# Patient Record
Sex: Male | Born: 1935 | Race: White | Hispanic: No | Marital: Married | Smoking: Never smoker
Health system: Southern US, Community
[De-identification: ages and names within clinical notes are randomized; demographics above are authoritative.]

## PROBLEM LIST (undated history)

## (undated) DIAGNOSIS — R739 Hyperglycemia, unspecified: Secondary | ICD-10-CM

## (undated) DIAGNOSIS — E119 Type 2 diabetes mellitus without complications: Secondary | ICD-10-CM

## (undated) DIAGNOSIS — F0390 Unspecified dementia without behavioral disturbance: Secondary | ICD-10-CM

## (undated) DIAGNOSIS — E079 Disorder of thyroid, unspecified: Secondary | ICD-10-CM

## (undated) DIAGNOSIS — I639 Cerebral infarction, unspecified: Secondary | ICD-10-CM

## (undated) DIAGNOSIS — I10 Essential (primary) hypertension: Secondary | ICD-10-CM

## (undated) HISTORY — PX: EYE SURGERY: SHX253

---

## 2011-01-01 ENCOUNTER — Ambulatory Visit: Payer: Self-pay | Admitting: Ophthalmology

## 2011-01-28 ENCOUNTER — Ambulatory Visit: Payer: Self-pay | Admitting: Ophthalmology

## 2014-03-16 ENCOUNTER — Ambulatory Visit: Payer: Self-pay | Admitting: Unknown Physician Specialty

## 2016-01-17 DIAGNOSIS — I350 Nonrheumatic aortic (valve) stenosis: Secondary | ICD-10-CM | POA: Insufficient documentation

## 2016-09-20 ENCOUNTER — Encounter: Payer: Self-pay | Admitting: Gynecology

## 2016-09-20 ENCOUNTER — Ambulatory Visit
Admission: EM | Admit: 2016-09-20 | Discharge: 2016-09-20 | Disposition: A | Discharge status: Home / Self Care | Payer: Medicare Other | Attending: Family Medicine | Admitting: Family Medicine

## 2016-09-20 DIAGNOSIS — H9221 Otorrhagia, right ear: Secondary | ICD-10-CM

## 2016-09-20 HISTORY — DX: Type 2 diabetes mellitus without complications: E11.9

## 2016-09-20 HISTORY — DX: Essential (primary) hypertension: I10

## 2016-09-20 HISTORY — DX: Hyperglycemia, unspecified: R73.9

## 2016-09-20 HISTORY — DX: Disorder of thyroid, unspecified: E07.9

## 2016-09-20 NOTE — ED Triage Notes (Signed)
Per patient lesion remove from right ear.per orders patient had biopsy done on his right ear x 4 days ago and was told not to take off bandage until seventh day. Per patient notice blood from bandage.

## 2016-09-20 NOTE — ED Provider Notes (Signed)
[facility]    CSN: [id number] Arrival date & time: 09/20/16  1427     History   Chief Complaint Chief Complaint  Patient presents with  . Ear Problem    HPI Jerry Barrett is a 83 y.o. male.   Patient is a 57-year-old white male who had a biopsy of the right ear. I've still concerned about some type of CA on the right ear lobe. According to his wife he's had bleeding since he had a biopsy done on Wednesday. They're ptotic take the dressing off on Monday the still bleeding and oozing from the site and this morning was even worse. He states she's not sleeping on the right side and avoiding. Pressure on it still bleeds and still losing blood. He does have bilateral hearing aids.      Past Medical History:  Diagnosis Date  . Diabetes mellitus without complication (HCC)   . Hyperglycemia   . Hypertension   . Thyroid disease     There are no active problems to display for this patient.   Past Surgical History:  Procedure Laterality Date  . EYE SURGERY         Home Medications    Prior to Admission medications   Medication Sig Start Date End Date Taking? Authorizing Provider  aspirin EC 81 MG tablet Take 81 mg by mouth daily.   Yes Historical Provider, MD  levothyroxine (SYNTHROID, LEVOTHROID) 50 MCG tablet Take 50 mcg by mouth daily before breakfast.   Yes Historical Provider, MD  lisinopril (PRINIVIL,ZESTRIL) 2.5 MG tablet Take 2.5 mg by mouth daily.   Yes Historical Provider, MD  lovastatin (MEVACOR) 40 MG tablet Take 80 mg by mouth at bedtime.   Yes Historical Provider, MD  metFORMIN (GLUCOPHAGE) 850 MG tablet Take 850 mg by mouth 3 (three) times daily.   Yes Historical Provider, MD  sildenafil (VIAGRA) 100 MG tablet Take 100 mg by mouth daily as needed for erectile dysfunction.   Yes Historical Provider, MD    Family History No family history on file.  Social History Social History  Substance Use Topics  . Smoking status: Never Smoker  .  Smokeless tobacco: Never Used  . Alcohol use No     Allergies   Patient has no known allergies.   Review of Systems Review of Systems  All other systems reviewed and are negative.    Physical Exam Triage Vital Signs ED Triage Vitals  Enc Vitals Group     BP 09/20/16 1500 (!) 158/60     Pulse Rate 09/20/16 1500 79     Resp 09/20/16 1500 16     Temp 09/20/16 1500 98.1 F (36.7 C)     Temp Source 09/20/16 1500 Oral     SpO2 09/20/16 1500 100 %     Weight 09/20/16 1503 180 lb (81.6 kg)     Height 09/20/16 1503 6' (1.829 m)     Head Circumference --      Peak Flow --      Pain Score --      Pain Loc --      Pain Edu? --      Excl. in GC? --    No data found.   Updated Vital Signs BP (!) 158/60 (BP Location: Left Arm)   Pulse 79   Temp 98.1 F (36.7 C) (Oral)   Resp 16   Ht 6' (1.829 m)   Wt 180 lb (81.6 kg)   SpO2 100%  BMI 24.41 kg/m   Visual Acuity Right Eye Distance:   Left Eye Distance:   Bilateral Distance:    Right Eye Near:   Left Eye Near:    Bilateral Near:     Physical Exam  Constitutional: He appears well-developed and well-nourished.  HENT:  Head: Normocephalic.  Right Ear: Hearing, tympanic membrane and ear canal normal.  Left Ear: Hearing, tympanic membrane, external ear and ear canal normal.  Ears:  Nose: Nose normal.  Mouth/Throat: Uvula is midline.  Patient has a biopsy site over the right external ear. Should be noted that with pressure dressings at near the opening of the ear is not adherent fact you see with a blood has used out from that area  Eyes: Pupils are equal, round, and reactive to light.  Neck: Normal range of motion. Neck supple.  Pulmonary/Chest: Effort normal.  Musculoskeletal: Normal range of motion.  Neurological: He is alert.  Skin: Skin is warm.  Psychiatric: He has a normal mood and affect.     UC Treatments / Results  Labs (all labs ordered are listed, but only abnormal results are displayed) Labs  Reviewed - No data to display  EKG  EKG Interpretation None       Radiology No results found.  Procedures Wound repair Date/Time: 09/20/2016 3:27 PM Performed by: Hassan Rowan Authorized by: Hassan Rowan  Consent: Verbal consent obtained. Local anesthesia used: no  Anesthesia: Local anesthesia used: no  Sedation: Patient sedated: no Patient tolerance: Patient tolerated the procedure well with no immediate complications Comments: Since the packing was no longer adherent to the ear by the ear canal bowel packing was removed patient tolerated that quite well OpSite was placed to meet adherence of OpSite to the bleeding site which was close to the opening of the ear canal. Then a pressure dressing was applied as well.    (including critical care time)  Medications Ordered in UC Medications - No data to display   Initial Impression / Assessment and Plan / UC Course  I have reviewed the triage vital signs and the nursing notes.  Pertinent labs & imaging results that were available during my care of the patient were reviewed by me and considered in my medical decision making (see chart for details).    OpSite was followed with a pressure dressing to keep the dressing in place recommend following up with his dermatologist on Monday given the mother called may want to have OpSite remain total falls off for take a look at it.  Final Clinical Impressions(s) / UC Diagnoses   Final diagnoses:  Bleeding from ear, right     New Prescriptions New Prescriptions   No medications on file    Note: This dictation was prepared with Dragon dictation along with smaller phrase technology. Any transcriptional errors that result from this process are unintentional.   Hassan Rowan, MD 09/20/16 1530

## 2016-09-20 NOTE — Discharge Instructions (Signed)
Leave outside on the right ear ideally and 2 Wednesday but follow-up with dermatologist if bleeding continues after that

## 2017-07-19 ENCOUNTER — Emergency Department: Payer: Medicare Other

## 2017-07-19 ENCOUNTER — Encounter: Payer: Self-pay | Admitting: Emergency Medicine

## 2017-07-19 ENCOUNTER — Other Ambulatory Visit: Payer: Self-pay

## 2017-07-19 ENCOUNTER — Observation Stay
Admission: EM | Admit: 2017-07-19 | Discharge: 2017-07-21 | Disposition: A | Discharge status: Home / Self Care | Payer: Medicare Other | Attending: Internal Medicine | Admitting: Internal Medicine

## 2017-07-19 DIAGNOSIS — E119 Type 2 diabetes mellitus without complications: Secondary | ICD-10-CM

## 2017-07-19 DIAGNOSIS — I10 Essential (primary) hypertension: Secondary | ICD-10-CM | POA: Diagnosis present

## 2017-07-19 DIAGNOSIS — Z79899 Other long term (current) drug therapy: Secondary | ICD-10-CM | POA: Insufficient documentation

## 2017-07-19 DIAGNOSIS — R6510 Systemic inflammatory response syndrome (SIRS) of non-infectious origin without acute organ dysfunction: Principal | ICD-10-CM | POA: Diagnosis present

## 2017-07-19 DIAGNOSIS — R4182 Altered mental status, unspecified: Secondary | ICD-10-CM

## 2017-07-19 DIAGNOSIS — Z7982 Long term (current) use of aspirin: Secondary | ICD-10-CM | POA: Diagnosis not present

## 2017-07-19 DIAGNOSIS — E785 Hyperlipidemia, unspecified: Secondary | ICD-10-CM | POA: Diagnosis not present

## 2017-07-19 DIAGNOSIS — R5383 Other fatigue: Secondary | ICD-10-CM | POA: Insufficient documentation

## 2017-07-19 DIAGNOSIS — E039 Hypothyroidism, unspecified: Secondary | ICD-10-CM | POA: Diagnosis not present

## 2017-07-19 DIAGNOSIS — Z79890 Hormone replacement therapy (postmenopausal): Secondary | ICD-10-CM | POA: Diagnosis not present

## 2017-07-19 DIAGNOSIS — J101 Influenza due to other identified influenza virus with other respiratory manifestations: Secondary | ICD-10-CM | POA: Diagnosis present

## 2017-07-19 DIAGNOSIS — Z7984 Long term (current) use of oral hypoglycemic drugs: Secondary | ICD-10-CM | POA: Diagnosis not present

## 2017-07-19 LAB — INFLUENZA PANEL BY PCR (TYPE A & B)
INFLAPCR: POSITIVE — AB
Influenza B By PCR: NEGATIVE

## 2017-07-19 LAB — COMPREHENSIVE METABOLIC PANEL
ALBUMIN: 4 g/dL (ref 3.5–5.0)
ALK PHOS: 48 U/L (ref 38–126)
ALT: 42 U/L (ref 17–63)
AST: 58 U/L — ABNORMAL HIGH (ref 15–41)
Anion gap: 9 (ref 5–15)
BILIRUBIN TOTAL: 0.7 mg/dL (ref 0.3–1.2)
BUN: 15 mg/dL (ref 6–20)
CALCIUM: 8.7 mg/dL — AB (ref 8.9–10.3)
CO2: 22 mmol/L (ref 22–32)
CREATININE: 1.05 mg/dL (ref 0.61–1.24)
Chloride: 100 mmol/L — ABNORMAL LOW (ref 101–111)
GFR calc Af Amer: >60 mL/min (ref >60)
GFR calc non Af Amer: >60 mL/min (ref >60)
GLUCOSE: 142 mg/dL — AB (ref 65–99)
Potassium: 3.9 mmol/L (ref 3.5–5.1)
SODIUM: 131 mmol/L — AB (ref 135–145)
Total Protein: 7.3 g/dL (ref 6.5–8.1)

## 2017-07-19 LAB — CBC WITH DIFFERENTIAL/PLATELET
BASOS PCT: 1 %
Basophils Absolute: 0 10*3/uL (ref 0–0.1)
EOS ABS: 0.1 10*3/uL (ref 0–0.7)
Eosinophils Relative: 1 %
HEMATOCRIT: 40.6 % (ref 40.0–52.0)
Hemoglobin: 14.3 g/dL (ref 13.0–18.0)
Lymphocytes Relative: 10 %
Lymphs Abs: 0.9 10*3/uL — ABNORMAL LOW (ref 1.0–3.6)
MCH: 34 pg (ref 26.0–34.0)
MCHC: 35.3 g/dL (ref 32.0–36.0)
MCV: 96.4 fL (ref 80.0–100.0)
MONO ABS: 0.6 10*3/uL (ref 0.2–1.0)
MONOS PCT: 7 %
Neutro Abs: 6.8 10*3/uL — ABNORMAL HIGH (ref 1.4–6.5)
Neutrophils Relative %: 81 %
PLATELETS: 156 10*3/uL (ref 150–440)
RBC: 4.22 MIL/uL — ABNORMAL LOW (ref 4.40–5.90)
RDW: 13.8 % (ref 11.5–14.5)
WBC: 8.3 10*3/uL (ref 3.8–10.6)

## 2017-07-19 LAB — URINALYSIS, ROUTINE W REFLEX MICROSCOPIC
BACTERIA UA: NONE SEEN
BILIRUBIN URINE: NEGATIVE
GLUCOSE, UA: NEGATIVE mg/dL
KETONES UR: NEGATIVE mg/dL
Leukocytes, UA: NEGATIVE
NITRITE: NEGATIVE
PH: 6 (ref 5.0–8.0)
PROTEIN: NEGATIVE mg/dL
Specific Gravity, Urine: 1.015 (ref 1.005–1.030)
Squamous Epithelial / LPF: NONE SEEN
WBC, UA: NONE SEEN WBC/hpf (ref 0–5)

## 2017-07-19 LAB — LACTIC ACID, PLASMA
LACTIC ACID, VENOUS: 1.6 mmol/L (ref 0.5–1.9)
Lactic Acid, Venous: 2.1 mmol/L (ref 0.5–1.9)

## 2017-07-19 LAB — GLUCOSE, CAPILLARY: GLUCOSE-CAPILLARY: 139 mg/dL — AB (ref 65–99)

## 2017-07-19 MED ORDER — LEVOTHYROXINE SODIUM 50 MCG PO TABS
50.0000 ug | ORAL_TABLET | Freq: Every day | ORAL | Status: DC
  Administered 2017-07-20 – 2017-07-21 (×2): 50 ug via ORAL
  Filled 2017-07-19 (×2): qty 1

## 2017-07-19 MED ORDER — ENOXAPARIN SODIUM 40 MG/0.4ML ~~LOC~~ SOLN
40.0000 mg | SUBCUTANEOUS | Status: DC
  Administered 2017-07-20: 40 mg via SUBCUTANEOUS
  Filled 2017-07-19: qty 0.4

## 2017-07-19 MED ORDER — SODIUM CHLORIDE 0.9 % IV BOLUS (SEPSIS)
1000.0000 mL | Freq: Once | INTRAVENOUS | Status: AC
  Administered 2017-07-19: 1000 mL via INTRAVENOUS

## 2017-07-19 MED ORDER — ACETAMINOPHEN 325 MG PO TABS: 650.0000 mg | ORAL_TABLET | Freq: Four times a day (QID) | ORAL | Status: DC | PRN

## 2017-07-19 MED ORDER — VANCOMYCIN HCL 10 G IV SOLR
1250.0000 mg | INTRAVENOUS | Status: DC
  Administered 2017-07-20: 1250 mg via INTRAVENOUS
  Filled 2017-07-19 (×2): qty 1250

## 2017-07-19 MED ORDER — SODIUM CHLORIDE 0.9 % IV BOLUS (SEPSIS)
500.0000 mL | Freq: Once | INTRAVENOUS | Status: AC
  Administered 2017-07-19: 500 mL via INTRAVENOUS

## 2017-07-19 MED ORDER — PIPERACILLIN-TAZOBACTAM 3.375 G IVPB 30 MIN
3.3750 g | Freq: Once | INTRAVENOUS | Status: AC
  Administered 2017-07-19: 3.375 g via INTRAVENOUS
  Filled 2017-07-19: qty 50

## 2017-07-19 MED ORDER — ONDANSETRON HCL 4 MG/2ML IJ SOLN: 4.0000 mg | Freq: Four times a day (QID) | INTRAMUSCULAR | Status: DC | PRN

## 2017-07-19 MED ORDER — VANCOMYCIN HCL IN DEXTROSE 1-5 GM/200ML-% IV SOLN
1000.0000 mg | Freq: Once | INTRAVENOUS | Status: AC
  Administered 2017-07-19: 1000 mg via INTRAVENOUS
  Filled 2017-07-19: qty 200

## 2017-07-19 MED ORDER — ASPIRIN EC 81 MG PO TBEC
81.0000 mg | DELAYED_RELEASE_TABLET | Freq: Every day | ORAL | Status: DC
  Administered 2017-07-20 – 2017-07-21 (×2): 81 mg via ORAL
  Filled 2017-07-19 (×2): qty 1

## 2017-07-19 MED ORDER — SODIUM CHLORIDE 0.9 % IV SOLN
INTRAVENOUS | Status: AC
  Administered 2017-07-19: 23:00:00 via INTRAVENOUS

## 2017-07-19 MED ORDER — INSULIN ASPART 100 UNIT/ML ~~LOC~~ SOLN: 0.0000 [IU] | Freq: Every day | SUBCUTANEOUS | Status: DC

## 2017-07-19 MED ORDER — PIPERACILLIN-TAZOBACTAM 3.375 G IVPB
3.3750 g | Freq: Three times a day (TID) | INTRAVENOUS | Status: DC
  Administered 2017-07-19 – 2017-07-20 (×2): 3.375 g via INTRAVENOUS
  Filled 2017-07-19 (×2): qty 50

## 2017-07-19 MED ORDER — IPRATROPIUM-ALBUTEROL 0.5-2.5 (3) MG/3ML IN SOLN: 3.0000 mL | RESPIRATORY_TRACT | Status: DC | PRN

## 2017-07-19 MED ORDER — PRAVASTATIN SODIUM 40 MG PO TABS
80.0000 mg | ORAL_TABLET | Freq: Every day | ORAL | Status: DC
  Administered 2017-07-20: 80 mg via ORAL
  Filled 2017-07-19: qty 2

## 2017-07-19 MED ORDER — ONDANSETRON HCL 4 MG PO TABS: 4.0000 mg | ORAL_TABLET | Freq: Four times a day (QID) | ORAL | Status: DC | PRN

## 2017-07-19 MED ORDER — INSULIN ASPART 100 UNIT/ML ~~LOC~~ SOLN
0.0000 [IU] | Freq: Three times a day (TID) | SUBCUTANEOUS | Status: DC
  Administered 2017-07-20: 1 [IU] via SUBCUTANEOUS
  Administered 2017-07-20: 2 [IU] via SUBCUTANEOUS
  Administered 2017-07-20 – 2017-07-21 (×3): 1 [IU] via SUBCUTANEOUS
  Filled 2017-07-19 (×5): qty 1

## 2017-07-19 MED ORDER — OSELTAMIVIR PHOSPHATE 75 MG PO CAPS: 75.0000 mg | ORAL_CAPSULE | Freq: Once | ORAL | Status: DC

## 2017-07-19 MED ORDER — ACETAMINOPHEN 650 MG RE SUPP: 650.0000 mg | Freq: Four times a day (QID) | RECTAL | Status: DC | PRN

## 2017-07-19 MED ORDER — GUAIFENESIN-DM 100-10 MG/5ML PO SYRP: 5.0000 mL | ORAL_SOLUTION | ORAL | Status: DC | PRN

## 2017-07-19 MED ORDER — LORAZEPAM 2 MG/ML IJ SOLN
INTRAMUSCULAR | Status: AC
  Administered 2017-07-19: 1 mg
  Filled 2017-07-19: qty 1

## 2017-07-19 NOTE — ED Notes (Signed)
Pt belongings at the time of admission include: 1 yellow wedding band/ 1 pair of hearing aids *R&L*/  Glasses/ 1 blue shirt/ 1 plaid shirt/ 1 pair of black shoes. Wife Luiz Ochoa( Freedie) has patients pants and underwear (items were soiled)  Wife took home pts watch, car keys and wallet.

## 2017-07-19 NOTE — ED Triage Notes (Signed)
Pt has cough x 5 days with fever. Worse today with altered mental

## 2017-07-19 NOTE — Consult Note (Signed)
Pharmacy Antibiotic Note  Jerry CrapeDonald W Cashwell is a 84 y.o. male admitted on 07/19/2017 with Sepsis. Influenza A PCR: Positive.   Pharmacy has been consulted for Zosyn and Vancomycin dosing. Patient received vancomycin 1g IV and Zosyn 3.375 IV x 1 dose in ED.   Plan: Ke: 0.053  Vd: 57.1   T1/2: 13.08  Start vancomycin 1250mg  IV every 18 hours with 6 hour stack dosing. Calculated trough at Css 16.5. Trough level prior to 4th dose. Will monitor renal function and adjust dose as needed.   Start Zosyn 3.375 IV EI every 8 hours.   Height: 6' (182.9 cm) Weight: 180 lb (81.6 kg) IBW/kg (Calculated) : 77.6  No data recorded.  Recent Labs  Lab 07/19/17 1910  WBC 8.3  CREATININE 1.05  LATICACIDVEN 2.1*    Estimated Creatinine Clearance: 58.5 mL/min (by C-G formula based on SCr of 1.05 mg/dL).    No Known Allergies  Antimicrobials this admission: 2/10 Zosyn >> 2/10 Vancomycin >>   Dose adjustments this admission:  Microbiology results: 2/10 BCx: pending 2/10 Influenza A PCR: Positive   Thank you for allowing pharmacy to be a part of this patient's care.  Gardner CandleSheema M Amarion Portell, PharmD, BCPS Clinical Pharmacist 07/19/2017 10:05 PM

## 2017-07-19 NOTE — Progress Notes (Signed)
CODE SEPSIS - PHARMACY COMMUNICATION  **Broad Spectrum Antibiotics should be administered within 1 hour of Sepsis diagnosis**  Time Code Sepsis Called/Page Received: 2/10 @ 19:12  Antibiotics Ordered: Zosyn 3.375                                      Vancomycin 1g IV   Time of 1st antibiotic administration: 2/10 @ 19:19  Additional action taken by pharmacy: N/A  If necessary, Name of Provider/Nurse Contacted: N/A   Gardner CandleSheema M Jannessa Ogden, PharmD, BCPS Clinical Pharmacist 07/19/2017 7:37 PM

## 2017-07-19 NOTE — ED Notes (Signed)
Sitter with pt - pt has been attempting to get out of bed, pulling at iv's and monitor.

## 2017-07-19 NOTE — H&P (Signed)
[facility] -  at [facility]   PATIENT NAME: Jerry HummerDonald Barrett    MR#:  [id number]  DATE OF BIRTH:  01/02/1933  DATE OF ADMISSION:  07/19/2017  PRIMARY CARE PHYSICIAN: [place], [facility]   REQUESTING/REFERRING PHYSICIAN: Derrill KayGoodman, MD  CHIEF COMPLAINT:   Chief Complaint  Patient presents with  . Altered Mental Status  . Fever    HISTORY OF PRESENT ILLNESS:  Jerry Barrett  is a 84 y.o. male who presents with several days of progressive symptoms including fever, cough, myalgias.  Today he became more confused.  He is unable to contribute to his history and it is provided by his wife was at bedside.  She states that she had been treating his fever with Tylenol and his other symptoms with over-the-counter medications.  However, today when he became significantly more confused she brought him to the ED for evaluation.  Here he is found to be influenza A positive.  Initial workup does not strongly suggest bacterial infection, however he did meet Sirs criteria and so antibiotics were started initially empirically.  Hospitalist were called for admission.  PAST MEDICAL HISTORY:   Past Medical History:  Diagnosis Date  . Diabetes mellitus without complication (HCC)   . Hyperglycemia   . Hypertension   . Thyroid disease     PAST SURGICAL HISTORY:   Past Surgical History:  Procedure Laterality Date  . EYE SURGERY      SOCIAL HISTORY:   Social History   Tobacco Use  . Smoking status: Never Smoker  . Smokeless tobacco: Never Used  Substance Use Topics  . Alcohol use: No    FAMILY HISTORY:  History reviewed. No pertinent family history.  DRUG ALLERGIES:  No Known Allergies  MEDICATIONS AT HOME:   Prior to Admission medications   Medication Sig Start Date End Date Taking? Authorizing Provider  aspirin EC 81 MG tablet Take 81 mg by mouth daily.   Yes [provider]  levothyroxine (SYNTHROID, LEVOTHROID) 50 MCG tablet Take 50 mcg by  mouth daily before breakfast.   Yes [provider]  lisinopril (PRINIVIL,ZESTRIL) 2.5 MG tablet Take 2.5 mg by mouth daily.   Yes [provider]  lovastatin (MEVACOR) 40 MG tablet Take 80 mg by mouth at bedtime.   Yes [provider]  metFORMIN (GLUCOPHAGE) 850 MG tablet Take 850 mg by mouth 3 (three) times daily.   Yes [provider]  sildenafil (VIAGRA) 100 MG tablet Take 100 mg by mouth daily as needed for erectile dysfunction.   Yes [provider]    REVIEW OF SYSTEMS:  Review of Systems  Unable to perform ROS: Acuity of condition     VITAL SIGNS:   Vitals:   07/19/17 1906 07/19/17 1930 07/19/17 2000 07/19/17 2100  BP:  122/85 126/68   Pulse:  98 96 (!) 106  Resp:  (!) 26 (!) 23   SpO2:  96% 95% (!) 86%  Weight: 81.6 kg (180 lb)     Height: 6' (1.829 m)      Wt Readings from Last 3 Encounters:  07/19/17 81.6 kg (180 lb)  09/20/16 81.6 kg (180 lb)    PHYSICAL EXAMINATION:  Physical Exam  Vitals reviewed. Constitutional: He appears well-developed and well-nourished.  HENT:  Head: Normocephalic and atraumatic.  Mouth/Throat: Oropharynx is clear and moist.  Eyes: Conjunctivae and EOM are normal. Pupils are equal, round, and reactive to light. No scleral icterus.  Neck: Normal range of motion. Neck  supple. No JVD present. No thyromegaly present.  Cardiovascular: Normal rate, regular rhythm and intact distal pulses. Exam reveals no gallop and no friction rub.  No murmur heard. Respiratory: Effort normal and breath sounds normal. No respiratory distress. He has no wheezes. He has no rales.  GI: Soft. Bowel sounds are normal. He exhibits no distension. There is no tenderness.  Musculoskeletal: Normal range of motion. He exhibits no edema.  No arthritis, no gout  Lymphadenopathy:    He has no cervical adenopathy.  Neurological:  Unable to fully assess due to patient condition  Skin: Skin is warm and dry. No rash noted. No  erythema.  Psychiatric:  Unable to assess due to patient condition    LABORATORY PANEL:   CBC Recent Labs  Lab 07/19/17 1910  WBC 8.3  HGB 14.3  HCT 40.6  PLT 156   ------------------------------------------------------------------------------------------------------------------  Chemistries  Recent Labs  Lab 07/19/17 1910  NA 131*  K 3.9  CL 100*  CO2 22  GLUCOSE 142*  BUN 15  CREATININE 1.05  CALCIUM 8.7*  AST 58*  ALT 42  ALKPHOS 48  BILITOT 0.7   ------------------------------------------------------------------------------------------------------------------  Cardiac Enzymes No results for input(s): TROPONINI in the last 168 hours. ------------------------------------------------------------------------------------------------------------------  RADIOLOGY:  Dg Chest Port 1 View  Result Date: 07/19/2017 CLINICAL DATA:  Cough for 5 days with fever. EXAM: PORTABLE CHEST 1 VIEW COMPARISON:  None. FINDINGS: Cardiac silhouette is normal in size. No mediastinal or hilar masses. No convincing adenopathy. There are irregularly thickened bronchovascular markings in the medial lung bases, which is likely due to chronic bronchitic change. No convincing pneumonia. No pulmonary edema. No pleural effusion or pneumothorax. Skeletal structures are demineralized but grossly intact. IMPRESSION: No acute cardiopulmonary disease. Electronically Signed   By: Amie Portland M.D.   On: 07/19/2017 20:54    EKG:   Orders placed or performed during the hospital encounter of 07/19/17  . ED EKG 12-Lead  . ED EKG 12-Lead    IMPRESSION AND PLAN:  Principal Problem:   SIRS (systemic inflammatory response syndrome) (HCC) -IV antibiotics started, lactic acid was barely elevated at 2.1, we will use IV fluids and recheck until within normal limits, blood pressure stable, cultures sent, treatment for flu as below Active Problems:   Influenza A -patient is outside the window where Tamiflu  would be effective, supportive treatment will be utilized at this time IV fluids, PRN antitussive, PRN duo nebs if necessary   Diabetes (HCC) -sliding scale insulin with corresponding glucose checks   HTN (hypertension) -patient is normotensive at this time, hold home antihypertensives for now, can be restarted as his blood pressure rises   Hypothyroidism -home dose thyroid replacement   HLD (hyperlipidemia) -home dose statin  All the records are reviewed and case discussed with ED provider. Management plans discussed with the patient and/or family.  DVT PROPHYLAXIS: SubQ lovenox  GI PROPHYLAXIS: None  ADMISSION STATUS: Observation  CODE STATUS: Full Code Status History    This patient does not have a recorded code status. Please follow your organizational policy for patients in this situation.      TOTAL TIME TAKING CARE OF THIS PATIENT: 40 minutes.   Kaamil Morefield FIELDING 07/19/2017, 9:26 PM  [employer]  [phone]  CC: Primary care physician; [facility]  Note:  This document was prepared using Dragon voice recognition software and may include unintentional dictation errors.

## 2017-07-19 NOTE — ED Notes (Signed)
Critical lactic acid of 2.1 called from lab. Dr. Derrill KayGoodman notified, no new orders received.

## 2017-07-19 NOTE — ED Provider Notes (Signed)
[facility] Emergency Department Provider Note  ____________________________________________   I have reviewed the triage vital signs and the nursing notes.   HISTORY  Chief Complaint Altered Mental Status and Fever   History limited by and level 5 caveat due to: Altered Mental Status   HPI Jerry Barrett is a 84 y.o. male who presents to the emergency department today brought in by EMS because of concerns for altered mental status and fever.  EMS states that the patient has had a cough for roughly 1 week.  Apparently he is also had a fever for roughly 5 days.  Was seen by PCP 3 days ago.  Apparently wife stated that today he became more confused.  It started around noon.  Patient himself can tell me his name but does not know where he is why he is where he is or what year it is.   Per medical record review patient has a history of DM, HTN, was seen 3 days ago by PCP.  Past Medical History:  Diagnosis Date  . Diabetes mellitus without complication (HCC)   . Hyperglycemia   . Hypertension   . Thyroid disease     There are no active problems to display for this patient.   Past Surgical History:  Procedure Laterality Date  . EYE SURGERY      Prior to Admission medications   Medication Sig Start Date End Date Taking? Authorizing Provider  aspirin EC 81 MG tablet Take 81 mg by mouth daily.    [provider]  levothyroxine (SYNTHROID, LEVOTHROID) 50 MCG tablet Take 50 mcg by mouth daily before breakfast.    [provider]  lisinopril (PRINIVIL,ZESTRIL) 2.5 MG tablet Take 2.5 mg by mouth daily.    [provider]  lovastatin (MEVACOR) 40 MG tablet Take 80 mg by mouth at bedtime.    [provider]  metFORMIN (GLUCOPHAGE) 850 MG tablet Take 850 mg by mouth 3 (three) times daily.    [provider]  sildenafil (VIAGRA) 100 MG tablet Take 100 mg by mouth daily as needed for erectile dysfunction.    [provider]    Allergies Patient has no known allergies.  History reviewed. No pertinent family history.  Social History Social History   Tobacco Use  . Smoking status: Never Smoker  . Smokeless tobacco: Never Used  Substance Use Topics  . Alcohol use: No  . Drug use: Not on file    Review of Systems Unable to obtain secondary to mental status.  ____________________________________________   PHYSICAL EXAM:  VITAL SIGNS: ED Triage Vitals [07/19/17 1906]  Enc Vitals Group     BP      Pulse      Resp      Temp      Temp src      SpO2      Weight 180 lb (81.6 kg)     Height 6' (1.829 m)    Constitutional: Awake and alert. Not oriented to time, place or events.  Eyes: Conjunctivae are normal.  ENT   Head: Normocephalic and atraumatic.   Nose: No congestion/rhinnorhea.   Mouth/Throat: Mucous membranes are moist.   Neck: No stridor. Hematological/Lymphatic/Immunilogical: No cervical lymphadenopathy. Cardiovascular: Normal rate, regular rhythm.  Positive systolic murmur.  Respiratory: Normal respiratory effort without tachypnea nor retractions. Breath sounds are clear and equal bilaterally. No wheezes/rales/rhonchi. Gastrointestinal: Soft and non tender. No rebound. No guarding.  Genitourinary: Deferred Musculoskeletal: Normal range of motion in  all extremities. No lower extremity edema. Neurologic:  Awake, only oriented to self. Appears to move all extremities. Sensation grossly intact.  Skin:  Skin is warm, dry and intact. No rash noted.   ____________________________________________    LABS (pertinent positives/negatives)  Influenza a positive Lactic 2.1 UA not consistent with infection CBC wbc 8.3, hgb 14.3, plt 156 CMP na 131, glu 142   ____________________________________________    RADIOLOGY  CXR No pneumonia  I, Mychelle Kendra, personally viewed and evaluated these images (plain radiographs) as part of my medical decision  making. ____________________________________________   PROCEDURES  Procedures  ____________________________________________   INITIAL IMPRESSION / ASSESSMENT AND PLAN / ED COURSE  Pertinent labs & imaging results that were available during my care of the patient were reviewed by me and considered in my medical decision making (see chart for details).  Patient presented to the emergency department today because of concerns for altered mental status.  Initial vital signs concerning for high fever and heart rate consistent with sepsis.  Sepsis workup was initiated and broad testing was ordered.  Results are consistent with influenza.  No pneumonia on the chest x-ray.  Patient's lactic was minimally elevated.  Patient was given broad-spectrum IV antibiotics.  Will plan admission to the hospital service. Discussed findings with patient's wife.  ___________________________________________   FINAL CLINICAL IMPRESSION(S) / ED DIAGNOSES  Final diagnoses:  Altered mental status     Note: This dictation was prepared with Dragon dictation. Any transcriptional errors that result from this process are unintentional     Phineas SemenGoodman, Jahel Wavra, MD 07/19/17 2116

## 2017-07-20 DIAGNOSIS — R6510 Systemic inflammatory response syndrome (SIRS) of non-infectious origin without acute organ dysfunction: Secondary | ICD-10-CM | POA: Diagnosis not present

## 2017-07-20 LAB — BASIC METABOLIC PANEL
Anion gap: 9 (ref 5–15)
BUN: 12 mg/dL (ref 6–20)
CO2: 20 mmol/L — ABNORMAL LOW (ref 22–32)
CREATININE: 1 mg/dL (ref 0.61–1.24)
Calcium: 7.9 mg/dL — ABNORMAL LOW (ref 8.9–10.3)
Chloride: 101 mmol/L (ref 101–111)
Glucose, Bld: 140 mg/dL — ABNORMAL HIGH (ref 65–99)
POTASSIUM: 3.4 mmol/L — AB (ref 3.5–5.1)
SODIUM: 130 mmol/L — AB (ref 135–145)

## 2017-07-20 LAB — GLUCOSE, CAPILLARY
GLUCOSE-CAPILLARY: 121 mg/dL — AB (ref 65–99)
Glucose-Capillary: 126 mg/dL — ABNORMAL HIGH (ref 65–99)
Glucose-Capillary: 170 mg/dL — ABNORMAL HIGH (ref 65–99)
Glucose-Capillary: 145 mg/dL — ABNORMAL HIGH (ref 65–99)

## 2017-07-20 LAB — CBC
HCT: 35.8 % — ABNORMAL LOW (ref 40.0–52.0)
Hemoglobin: 12.7 g/dL — ABNORMAL LOW (ref 13.0–18.0)
MCH: 34.1 pg — ABNORMAL HIGH (ref 26.0–34.0)
MCHC: 35.6 g/dL (ref 32.0–36.0)
MCV: 95.9 fL (ref 80.0–100.0)
PLATELETS: 142 10*3/uL — AB (ref 150–440)
RBC: 3.74 MIL/uL — ABNORMAL LOW (ref 4.40–5.90)
RDW: 14.1 % (ref 11.5–14.5)
WBC: 9.5 10*3/uL (ref 3.8–10.6)

## 2017-07-20 LAB — TSH: TSH: 2.195 u[IU]/mL (ref 0.350–4.500)

## 2017-07-20 MED ORDER — OSELTAMIVIR PHOSPHATE 75 MG PO CAPS
75.0000 mg | ORAL_CAPSULE | Freq: Two times a day (BID) | ORAL | Status: DC
  Administered 2017-07-20 – 2017-07-21 (×3): 75 mg via ORAL
  Filled 2017-07-20 (×4): qty 1

## 2017-07-20 NOTE — Evaluation (Signed)
Physical Therapy Evaluation Patient Details Name: Jerry Barrett MRN: [id number] DOB: 1932/08/23 Today's Date: 07/20/2017   History of Present Illness  84 yo male with onset of confusion, Flu A, SIRS, and is being treated for the flu.  PMHx:  DM, HTN, thyroid disease,   Clinical Impression  Pt was seen for evaluation of mobility with less control of standing and gait than prior to his hosp demonstrated.  Pt's wife was present to observe, noted that pt would benefit from a course of PT at home to increase his strength.  Pt is feeling better than yesterday, plan to continue therapy acutely to increase his balance and control of gait to increase safety and decrease fall risk for home.    Follow Up Recommendations Home health PT;Supervision for mobility/OOB    Equipment Recommendations  Rolling walker with 5" wheels    Recommendations for Other Services       Precautions / Restrictions Precautions Precautions: Fall Restrictions Weight Bearing Restrictions: No      Mobility  Bed Mobility Overal bed mobility: Needs Assistance Bed Mobility: Supine to Sit     Supine to sit: Min assist;Mod assist     General bed mobility comments: min to support trunk and mod to finish scooting  Transfers Overall transfer level: Needs assistance Equipment used: Rolling walker (2 wheeled) Transfers: Stand Pivot Transfers;Sit to/from Stand Sit to Stand: Min assist         General transfer comment: reminders for hand placement  Ambulation/Gait Ambulation/Gait assistance: Min guard;Min assist Ambulation Distance (Feet): 60 Feet Assistive device: Rolling walker (2 wheeled);1 person hand held assist Gait Pattern/deviations: Step-to pattern;Step-through pattern;Decreased stride length;Wide base of support Gait velocity: reduced Gait velocity interpretation: Below normal speed for age/gender    Stairs            Wheelchair Mobility    Modified Rankin (Stroke Patients Only)        Balance Overall balance assessment: Needs assistance Sitting-balance support: Feet supported Sitting balance-Leahy Scale: Fair     Standing balance support: Bilateral upper extremity supported Standing balance-Leahy Scale: Poor                               Pertinent Vitals/Pain      Home Living Family/patient expects to be discharged to:: Private residence Living Arrangements: Spouse/significant other Available Help at Discharge: Family;Available 24 hours/day Type of Home: House Home Access: Stairs to enter Entrance Stairs-Rails: None Entrance Stairs-Number of Steps: 2 Home Layout: One level Home Equipment: None      Prior Function Level of Independence: Independent               Hand Dominance   Dominant Hand: Right    Extremity/Trunk Assessment   Upper Extremity Assessment Upper Extremity Assessment: Overall WFL for tasks assessed    Lower Extremity Assessment Lower Extremity Assessment: Generalized weakness    Cervical / Trunk Assessment Cervical / Trunk Assessment: Normal  Communication   Communication: No difficulties  Cognition                                              General Comments      Exercises     Assessment/Plan    PT Assessment Patient needs continued PT services  PT Problem List Decreased strength;Decreased range of motion;Decreased  activity tolerance;Decreased balance;Decreased mobility;Decreased coordination;Decreased knowledge of use of DME;Decreased safety awareness;Cardiopulmonary status limiting activity       PT Treatment Interventions DME instruction;Gait training;Functional mobility training;Therapeutic activities;Therapeutic exercise;Balance training;Neuromuscular re-education;Patient/family education    PT Goals (Current goals can be found in the Care Plan section)  Acute Rehab PT Goals Patient Stated Goal: to walk and get stronger PT Goal Formulation: With patient/family Time  For Goal Achievement: 08/03/17 Potential to Achieve Goals: Good    Frequency Min 2X/week   Barriers to discharge Inaccessible home environment stairs with no rails to enter house    Co-evaluation               AM-PAC PT "6 Clicks" Daily Activity  Outcome Measure Difficulty turning over in bed (including adjusting bedclothes, sheets and blankets)?: A Little Difficulty moving from lying on back to sitting on the side of the bed? : Unable Difficulty sitting down on and standing up from a chair with arms (e.g., wheelchair, bedside commode, etc,.)?: Unable Help needed moving to and from a bed to chair (including a wheelchair)?: A Little Help needed walking in hospital room?: A Little Help needed climbing 3-5 steps with a railing? : A Lot 6 Click Score: 13    End of Session Equipment Utilized During Treatment: Gait belt Activity Tolerance: Patient tolerated treatment well;Patient limited by fatigue Patient left: in chair;with call bell/phone within reach;with chair alarm set;with family/visitor present Nurse Communication: Mobility status PT Visit Diagnosis: Unsteadiness on feet (R26.81);Other abnormalities of gait and mobility (R26.89);Muscle weakness (generalized) (M62.81);Difficulty in walking, not elsewhere classified (R26.2);Adult, failure to thrive (R62.7)    Time: 1701-1731 PT Time Calculation (min) (ACUTE ONLY): 30 min   Charges:   PT Evaluation $PT Eval Moderate Complexity: 1 Mod PT Treatments $Gait Training: 8-22 mins   PT G Codes:   PT G-Codes **NOT FOR INPATIENT CLASS** Functional Assessment Tool Used: AM-PAC 6 Clicks Basic Mobility    Ivar DrapeRuth E Koi Yarbro 07/20/2017, 8:47 PM   Samul Dadauth Siedah Sedor, PT MS Acute Rehab Dept. Number: [facility] [phone] and [facility] [phone]

## 2017-07-20 NOTE — Progress Notes (Signed)
[facility] [place] [facility]   PATIENT NAME: Jerry Barrett    MR#:  [id number]  DATE OF BIRTH:  [birth year]  SUBJECTIVE:  CHIEF COMPLAINT:   Chief Complaint  Patient presents with  . Altered Mental Status  . Fever   Came with generalized weakness for last 1 week, had fever and fatigue symptoms. REVIEW OF SYSTEMS:  CONSTITUTIONAL: No fever, positive for fatigue or weakness.  EYES: No blurred or double vision.  EARS, NOSE, AND THROAT: No tinnitus or ear pain.  RESPIRATORY: Have cough, shortness of breath, wheezing or hemoptysis.  CARDIOVASCULAR: No chest pain, orthopnea, edema.  GASTROINTESTINAL: No nausea, vomiting, diarrhea or abdominal pain.  GENITOURINARY: No dysuria, hematuria.  ENDOCRINE: No polyuria, nocturia,  HEMATOLOGY: No anemia, easy bruising or bleeding SKIN: No rash or lesion. MUSCULOSKELETAL: No joint pain or arthritis.   NEUROLOGIC: No tingling, numbness, weakness.  PSYCHIATRY: No anxiety or depression.   ROS  DRUG ALLERGIES:  No Known Allergies  VITALS:  Blood pressure 120/60, pulse 78, temperature 98.1 F (36.7 C), temperature source Oral, resp. rate 18, height 6' (1.829 m), weight 81.6 kg (180 lb), SpO2 95 %.  PHYSICAL EXAMINATION:  GENERAL:  84 y.o.-year-old patient lying in the bed with no acute distress.  EYES: Pupils equal, round, reactive to light and accommodation. No scleral icterus. Extraocular muscles intact.  HEENT: Head atraumatic, normocephalic. Oropharynx and nasopharynx clear.  NECK:  Supple, no jugular venous distention. No thyroid enlargement, no tenderness.  LUNGS: Normal breath sounds bilaterally, no wheezing, rales,rhonchi or crepitation. No use of accessory muscles of respiration.  CARDIOVASCULAR: S1, S2 normal. No murmurs, rubs, or gallops.  ABDOMEN: Soft, nontender, nondistended. Bowel sounds present. No organomegaly or mass.  EXTREMITIES: No pedal edema, cyanosis, or clubbing.  NEUROLOGIC: Cranial nerves II  through XII are intact. Muscle strength 4-5/5 in all extremities. Sensation intact. Gait not checked.  PSYCHIATRIC: The patient is alert and oriented x 3.  SKIN: No obvious rash, lesion, or ulcer.   Physical Exam LABORATORY PANEL:   CBC Recent Labs  Lab 07/20/17 0505  WBC 9.5  HGB 12.7*  HCT 35.8*  PLT 142*   ------------------------------------------------------------------------------------------------------------------  Chemistries  Recent Labs  Lab 07/19/17 1910 07/20/17 0505  NA 131* 130*  K 3.9 3.4*  CL 100* 101  CO2 22 20*  GLUCOSE 142* 140*  BUN 15 12  CREATININE 1.05 1.00  CALCIUM 8.7* 7.9*  AST 58*  --   ALT 42  --   ALKPHOS 48  --   BILITOT 0.7  --    ------------------------------------------------------------------------------------------------------------------  Cardiac Enzymes No results for input(s): TROPONINI in the last 168 hours. ------------------------------------------------------------------------------------------------------------------  RADIOLOGY:  Dg Chest Port 1 View  Result Date: 07/19/2017 CLINICAL DATA:  Cough for 5 days with fever. EXAM: PORTABLE CHEST 1 VIEW COMPARISON:  None. FINDINGS: Cardiac silhouette is normal in size. No mediastinal or hilar masses. No convincing adenopathy. There are irregularly thickened bronchovascular markings in the medial lung bases, which is likely due to chronic bronchitic change. No convincing pneumonia. No pulmonary edema. No pleural effusion or pneumothorax. Skeletal structures are demineralized but grossly intact. IMPRESSION: No acute cardiopulmonary disease. Electronically Signed   By: Amie Portland M.D.   On: 07/19/2017 20:54    ASSESSMENT AND PLAN:   Principal Problem:   SIRS (systemic inflammatory response syndrome) (HCC) Active Problems:   Influenza A   Diabetes (HCC)   HTN (hypertension)   Hypothyroidism   HLD (hyperlipidemia)  * SIRS (systemic inflammatory  response syndrome) (HCC) -IV  antibiotics started,   we will use IV fluids and recheck until within normal limits, blood pressure stable, cultures sent, treatment for flu   I will stop Abx.  *  Influenza A -  The patient presented 1 week since having the symptoms, I would still just treat him for 4 days of Tamiflu. IV fluids, PRN antitussive, PRN duo nebs if necessary *  Diabetes (HCC) -sliding scale insulin with corresponding glucose checks *  HTN (hypertension) -patient is normotensive at this time, hold home antihypertensives for now, can be restarted as his blood pressure rises *  Hypothyroidism -home dose thyroid replacement *  HLD (hyperlipidemia) -home dose statin  Physical therapy evaluation.    All the records are reviewed and case discussed with Care Management/Social Workerr. Management plans discussed with the patient, family and they are in agreement.  CODE STATUS: Full code.  TOTAL TIME TAKING CARE OF THIS PATIENT: 35 minutes.     POSSIBLE D/C IN 1-2 DAYS, DEPENDING ON CLINICAL CONDITION.   Jerry Barrett M.D on 07/20/2017   Between 7am to 6pm - Pager - [phone]  After 6pm go to [email or web address] - password [email or web address]  [facility]  Office  [phone]  CC: Primary care physician; Jerry Barrett, [facility]  Note: This dictation was prepared with Dragon dictation along with smaller phrase technology. Any transcriptional errors that result from this process are unintentional.

## 2017-07-20 NOTE — Care Management Obs Status (Signed)
MEDICARE OBSERVATION STATUS NOTIFICATION   Patient Details  Name: Jerry Barrett MRN: [id number] Date of Birth: 08/26/1932   Medicare Observation Status Notification Given:  Yes    Eber HongGreene, Deshawnda Acrey R, RN 07/20/2017, 5:06 PM

## 2017-07-20 NOTE — Progress Notes (Signed)
PHARMACY NOTE:  ANTIMICROBIAL RENAL DOSAGE ADJUSTMENT  Current antimicrobial regimen includes a mismatch between antimicrobial dosage and estimated renal function.  As per policy approved by the Pharmacy & Therapeutics and Medical Executive Committees, the antimicrobial dosage will be adjusted accordingly.  Current antimicrobial dosage:  Tamiflu 30 mg PO BID   Indication: Flu treatment   Renal Function:  Estimated Creatinine Clearance: 61.4 mL/min (by C-G formula based on SCr of 1 mg/dL). []      On intermittent HD, scheduled: []      On CRRT    Antimicrobial dosage has been changed to:  Tamiflu 75 mg PO BID X 5 days.   Additional comments:   Thank you for allowing pharmacy to be a part of this patient's care.  Sujata Maines D, [facility] 07/20/2017 2:32 PM

## 2017-07-21 DIAGNOSIS — R6510 Systemic inflammatory response syndrome (SIRS) of non-infectious origin without acute organ dysfunction: Secondary | ICD-10-CM | POA: Diagnosis not present

## 2017-07-21 LAB — GLUCOSE, CAPILLARY
GLUCOSE-CAPILLARY: 121 mg/dL — AB (ref 65–99)
Glucose-Capillary: 133 mg/dL — ABNORMAL HIGH (ref 65–99)

## 2017-07-21 MED ORDER — OSELTAMIVIR PHOSPHATE 75 MG PO CAPS: 75.0000 mg | ORAL_CAPSULE | Freq: Two times a day (BID) | ORAL | 0 refills | Status: AC

## 2017-07-21 MED ORDER — METFORMIN HCL 500 MG PO TABS: 850.0000 mg | ORAL_TABLET | Freq: Two times a day (BID) | ORAL | 0 refills | Status: DC

## 2017-07-21 NOTE — Care Management (Signed)
patient and his wife declined home health. Notified attending via text

## 2017-07-24 LAB — CULTURE, BLOOD (ROUTINE X 2)
CULTURE: NO GROWTH
Culture: NO GROWTH
SPECIAL REQUESTS: ADEQUATE
Special Requests: ADEQUATE

## 2017-07-28 NOTE — Discharge Summary (Signed)
[facility] [place] [facility]   PATIENT NAME: Jerry Barrett    MR#:  [id number]  DATE OF BIRTH:  [birth year]  DATE OF ADMISSION:  07/19/2017 ADMITTING PHYSICIAN: Oralia Manis, MD  DATE OF DISCHARGE: 07/21/2017  3:24 PM  PRIMARY CARE PHYSICIAN: [place], [facility]    ADMISSION DIAGNOSIS:  Altered mental status [R41.82]  DISCHARGE DIAGNOSIS:  Principal Problem:   SIRS (systemic inflammatory response syndrome) (HCC) Active Problems:   Influenza A   Diabetes (HCC)   HTN (hypertension)   Hypothyroidism   HLD (hyperlipidemia)   SECONDARY DIAGNOSIS:   Past Medical History:  Diagnosis Date  . Diabetes mellitus without complication (HCC)   . Hyperglycemia   . Hypertension   . Thyroid disease     HOSPITAL COURSE:   * SIRS (systemic inflammatory response syndrome) (HCC) -IV antibiotics started,    IV fluids, blood pressure stable, cultures sent, treatment for flu   stop Abx.  *Influenza A - just treat him for 4 days of Tamiflu. IV fluids, PRN antitussive, PRN duo nebs if necessary *Diabetes (HCC) -sliding scale insulin with corresponding glucose checks *HTN (hypertension) -patient is normotensive at this time, hold home antihypertensives for now, can be restarted as his blood pressure rises *Hypothyroidism -home dose thyroid replacement *HLD (hyperlipidemia) -home dose statin   DISCHARGE CONDITIONS:   Stable.  CONSULTS OBTAINED:    DRUG ALLERGIES:  No Known Allergies  DISCHARGE MEDICATIONS:   Allergies as of 07/21/2017   No Known Allergies     Medication List    TAKE these medications   aspirin EC 81 MG tablet Take 81 mg by mouth daily.   levothyroxine 50 MCG tablet Commonly known as:  SYNTHROID, LEVOTHROID Take 50 mcg by mouth daily before breakfast.   lisinopril 2.5 MG tablet Commonly known as:  PRINIVIL,ZESTRIL Take 2.5 mg by mouth daily.   lovastatin 40 MG tablet Commonly known as:  MEVACOR Take  80 mg by mouth at bedtime.   metFORMIN 500 MG tablet Commonly known as:  GLUCOPHAGE Take 1.5 tablets (750 mg total) by mouth 2 (two) times daily with a meal. What changed:    medication strength  how much to take  when to take this   sildenafil 100 MG tablet Commonly known as:  VIAGRA Take 100 mg by mouth daily as needed for erectile dysfunction.     ASK your doctor about these medications   oseltamivir 75 MG capsule Commonly known as:  TAMIFLU Take 1 capsule (75 mg total) by mouth 2 (two) times daily for 3 days. Ask about: Should I take this medication?        DISCHARGE INSTRUCTIONS:    Follow with PMD in 1-2 weeks.  If you experience worsening of your admission symptoms, develop shortness of breath, life threatening emergency, suicidal or homicidal thoughts you must seek medical attention immediately by calling 911 or calling your MD immediately  if symptoms less severe.  You Must read complete instructions/literature along with all the possible adverse reactions/side effects for all the Medicines you take and that have been prescribed to you. Take any new Medicines after you have completely understood and accept all the possible adverse reactions/side effects.   Please note  You were cared for by a hospitalist during your hospital stay. If you have any questions about your discharge medications or the care you received while you were in the hospital after you are discharged, you can call the unit and asked to speak with  the hospitalist on call if the hospitalist that took care of you is not available. Once you are discharged, your primary care physician will handle any further medical issues. Please note that NO REFILLS for any discharge medications will be authorized once you are discharged, as it is imperative that you return to your primary care physician (or establish a relationship with a primary care physician if you do not have one) for your aftercare needs so that  they can reassess your need for medications and monitor your lab values.    Today   CHIEF COMPLAINT:   Chief Complaint  Patient presents with  . Altered Mental Status  . Fever    HISTORY OF PRESENT ILLNESS:  Jerry Barrett  is a 84 y.o. male presents with several days of progressive symptoms including fever, cough, myalgias.  Today he became more confused.  He is unable to contribute to his history and it is provided by his wife was at bedside.  She Barrett that she had been treating his fever with Tylenol and his other symptoms with over-the-counter medications.  However, today when he became significantly more confused she brought him to the ED for evaluation.  Here he is found to be influenza A positive.  Initial workup does not strongly suggest bacterial infection, however he did meet Sirs criteria and so antibiotics were started initially empirically.  Hospitalist were called for admission.    VITAL SIGNS:  Blood pressure (!) 163/72, pulse 70, temperature 98.2 F (36.8 C), temperature source Oral, resp. rate 18, height 6' (1.829 m), weight 81.6 kg (180 lb), SpO2 97 %.  I/O:  No intake or output data in the 24 hours ending 07/28/17 2130  PHYSICAL EXAMINATION:   GENERAL:  84 y.o.-year-old patient lying in the bed with no acute distress.  EYES: Pupils equal, round, reactive to light and accommodation. No scleral icterus. Extraocular muscles intact.  HEENT: Head atraumatic, normocephalic. Oropharynx and nasopharynx clear.  NECK:  Supple, no jugular venous distention. No thyroid enlargement, no tenderness.  LUNGS: Normal breath sounds bilaterally, no wheezing, rales,rhonchi or crepitation. No use of accessory muscles of respiration.  CARDIOVASCULAR: S1, S2 normal. No murmurs, rubs, or gallops.  ABDOMEN: Soft, nontender, nondistended. Bowel sounds present. No organomegaly or mass.  EXTREMITIES: No pedal edema, cyanosis, or clubbing.  NEUROLOGIC: Cranial nerves II through XII are  intact. Muscle strength 4-5/5 in all extremities. Sensation intact. Gait not checked.  PSYCHIATRIC: The patient is alert and oriented x 3.  SKIN: No obvious rash, lesion, or ulcer.     DATA REVIEW:   CBC No results for input(s): WBC, HGB, HCT, PLT in the last 168 hours.  Chemistries  No results for input(s): NA, K, CL, CO2, GLUCOSE, BUN, CREATININE, CALCIUM, MG, AST, ALT, ALKPHOS, BILITOT in the last 168 hours.  Invalid input(s): GFRCGP  Cardiac Enzymes No results for input(s): TROPONINI in the last 168 hours.  Microbiology Results  Results for orders placed or performed during the hospital encounter of 07/19/17  Blood Culture (routine x 2)     Status: None   Collection Time: 07/19/17  7:10 PM  Result Value Ref Range Status   Specimen Description BLOOD LEFT ARM  Final   Special Requests   Final    BOTTLES DRAWN AEROBIC AND ANAEROBIC Blood Culture adequate volume   Culture   Final    NO GROWTH 5 DAYS Performed at [facility], [address], [place], [place] [address]    Report Status 07/24/2017 FINAL  Final  Blood Culture (routine x 2)     Status: None   Collection Time: 07/19/17  7:10 PM  Result Value Ref Range Status   Specimen Description BLOOD RIGHT ARM  Final   Special Requests   Final    BOTTLES DRAWN AEROBIC AND ANAEROBIC Blood Culture adequate volume   Culture   Final    NO GROWTH 5 DAYS Performed at [facility], [address] [place], [place] [address]    Report Status 07/24/2017 FINAL  Final    RADIOLOGY:  No results found.  EKG:   Orders placed or performed during the hospital encounter of 07/19/17  . ED EKG 12-Lead  . ED EKG 12-Lead  . EKG 12-Lead  . EKG 12-Lead      Management plans discussed with the patient, family and they are in agreement.  CODE STATUS:  Code Status History    Date Active Date Inactive Code Status Order ID Comments User Context   07/19/2017 22:43 07/21/2017 18:29 Full Code [id number]  Oralia ManisWillis, David,  MD Inpatient    Advance Directive Documentation     Most Recent Value  Type of Advance Directive  Healthcare Power of Attorney  Pre-existing out of facility DNR order (yellow form or pink MOST form)  No data  "MOST" Form in Place?  No data      TOTAL TIME TAKING CARE OF THIS PATIENT: 35 minutes.    Altamese DillingVaibhavkumar Tylerjames Hoglund M.D on 07/28/2017 at 9:30 PM  Between 7am to 6pm - Pager - [phone]  After 6pm go to [email or web address] - password [email or web address]  [facility]  Office  [phone]  CC: Primary care physician; [place]errilyn [place], [facility]   Note: This dictation was prepared with Dragon dictation along with smaller phrase technology. Any transcriptional errors that result from this process are unintentional.

## 2018-03-25 ENCOUNTER — Other Ambulatory Visit: Payer: Self-pay

## 2018-03-25 ENCOUNTER — Ambulatory Visit
Admission: EM | Admit: 2018-03-25 | Discharge: 2018-03-25 | Disposition: A | Discharge status: Home / Self Care | Payer: Medicare Other | Attending: Family Medicine | Admitting: Family Medicine

## 2018-03-25 DIAGNOSIS — I10 Essential (primary) hypertension: Secondary | ICD-10-CM

## 2018-03-25 DIAGNOSIS — L723 Sebaceous cyst: Secondary | ICD-10-CM

## 2018-03-25 DIAGNOSIS — L089 Local infection of the skin and subcutaneous tissue, unspecified: Secondary | ICD-10-CM | POA: Diagnosis not present

## 2018-03-25 MED ORDER — DOXYCYCLINE HYCLATE 100 MG PO CAPS: 100.0000 mg | ORAL_CAPSULE | Freq: Two times a day (BID) | ORAL | 0 refills | Status: DC

## 2018-03-25 NOTE — ED Provider Notes (Signed)
[facility]    CSN: [id number] Arrival date & time: 03/25/18  0804  History   Chief Complaint Chief Complaint  Patient presents with  . Abscess   HPI  84 year old male presents with the above complaint.  Patient states that he has had a cyst in his right axilla for approximately 1 year.  He states that over the past 2 weeks the area has gotten red and has been draining.  Associated mild pain.  No fever.  No chills.  He has been attempting to remove contents without success.  No medications tried.  No other associated symptoms.  No other complaints or concerns at this time.  History reviewed and updated as below. Past Medical History:  Diagnosis Date  . Diabetes mellitus without complication (HCC)   . Hyperglycemia   . Hypertension   . Thyroid disease    Patient Active Problem List   Diagnosis Date Noted  . SIRS (systemic inflammatory response syndrome) (HCC) 07/19/2017  . Influenza A 07/19/2017  . Diabetes (HCC) 07/19/2017  . HTN (hypertension) 07/19/2017  . Hypothyroidism 07/19/2017  . HLD (hyperlipidemia) 07/19/2017   Past Surgical History:  Procedure Laterality Date  . EYE SURGERY     Home Medications    Prior to Admission medications   Medication Sig Start Date End Date Taking? Authorizing Provider  aspirin EC 81 MG tablet Take 81 mg by mouth daily.   Yes [provider]  levothyroxine (SYNTHROID, LEVOTHROID) 50 MCG tablet Take 50 mcg by mouth daily before breakfast.   Yes [provider]  lisinopril (PRINIVIL,ZESTRIL) 2.5 MG tablet Take 2.5 mg by mouth daily.   Yes [provider]  lovastatin (MEVACOR) 40 MG tablet Take 80 mg by mouth at bedtime.   Yes [provider]  metFORMIN (GLUCOPHAGE) 500 MG tablet Take 1.5 tablets (750 mg total) by mouth 2 (two) times daily with a meal. 07/21/17  Yes Altamese Dilling, MD  sildenafil (VIAGRA) 100 MG tablet Take 100 mg by mouth daily as needed for erectile dysfunction.    Yes [provider]  doxycycline (VIBRAMYCIN) 100 MG capsule Take 1 capsule (100 mg total) by mouth 2 (two) times daily. 03/25/18   Tommie Sams, DO   Social History Social History   Tobacco Use  . Smoking status: Never Smoker  . Smokeless tobacco: Never Used  Substance Use Topics  . Alcohol use: No  . Drug use: Not on file     Allergies   Patient has no known allergies.   Review of Systems Review of Systems  Constitutional: Negative for chills and fever.  Skin:       Infected cyst.   Physical Exam Triage Vital Signs ED Triage Vitals  Enc Vitals Group     BP 03/25/18 0813 (!) 164/84     Pulse Rate 03/25/18 0813 71     Resp 03/25/18 0813 18     Temp 03/25/18 0813 97.7 F (36.5 C)     Temp Source 03/25/18 0813 Oral     SpO2 03/25/18 0813 100 %     Weight 03/25/18 0811 180 lb (81.6 kg)     Height 03/25/18 0811 6' (1.829 m)     Head Circumference --      Peak Flow --      Pain Score 03/25/18 0811 4     Pain Loc --      Pain Edu? --      Excl. in GC? --    Updated Vital  Signs BP (!) 164/84 (BP Location: Left Arm)   Pulse 71   Temp 97.7 F (36.5 C) (Oral)   Resp 18   Ht 6' (1.829 m)   Wt 81.6 kg   SpO2 100%   BMI 24.41 kg/m   Visual Acuity Right Eye Distance:   Left Eye Distance:   Bilateral Distance:    Right Eye Near:   Left Eye Near:    Bilateral Near:     Physical Exam  Constitutional: He is oriented to person, place, and time. He appears well-developed. No distress.  HENT:  Head: Normocephalic and atraumatic.  Eyes: Conjunctivae are normal. Right eye exhibits no discharge. Left eye exhibits no discharge.  Pulmonary/Chest: Effort normal. No respiratory distress.  Neurological: He is alert and oriented to person, place, and time.  Hard of hearing.  Skin:  R axilla - 3.5 x 3.5 cm raised, fluctuant area with erythema.   Psychiatric: He has a normal mood and affect. His behavior is normal.  Nursing note and vitals reviewed.  UC  Treatments / Results  Labs (all labs ordered are listed, but only abnormal results are displayed) Labs Reviewed - No data to display  EKG None  Radiology No results found.  Procedures Incision and Drainage Date/Time: 03/25/2018 8:55 AM Performed by: Tommie Sams, DO Authorized by: Tommie Sams, DO   Consent:    Consent obtained:  Verbal Location:    Type:  Cyst   Location:  Upper extremity   Upper extremity location: Right axilla. Pre-procedure details:    Skin preparation:  Betadine Anesthesia (see MAR for exact dosages):    Anesthesia method:  Local infiltration   Local anesthetic:  Lidocaine 1% WITH epi Procedure type:    Complexity:  Simple Procedure details:    Incision types:  Single straight   Scalpel blade:  11   Drainage characteristics: Cyst contents and pus.   Drainage amount:  Moderate   Packing materials:  1/4 in gauze Post-procedure details:    Patient tolerance of procedure:  Tolerated well, no immediate complications   (including critical care time)  Medications Ordered in UC Medications - No data to display  Initial Impression / Assessment and Plan / UC Course  I have reviewed the triage vital signs and the nursing notes.  Pertinent labs & imaging results that were available during my care of the patient were reviewed by me and considered in my medical decision making (see chart for details).    84 year old male presents with an infected cyst.  Incision and drainage performed today with evacuation of the cyst contents and pus.  Wound was packed.  Patient to return in 2 days for packing removal and reassessment.  Starting on doxycycline.  Final Clinical Impressions(s) / UC Diagnoses   Final diagnoses:  Infected sebaceous cyst     Discharge Instructions     Change dressing daily.   He can bathe, just try not to remove the packing.  Wound recheck in 2 days.  Antibiotic as prescribed.  Take care  Dr. Adriana Simas    ED Prescriptions     Medication Sig Dispense Auth. Provider   doxycycline (VIBRAMYCIN) 100 MG capsule Take 1 capsule (100 mg total) by mouth 2 (two) times daily. 14 capsule Tommie Sams, DO     Controlled Substance Prescriptions [place] Controlled Substance Registry consulted? Not Applicable   Tommie Sams, [place] 03/25/18 4098

## 2018-03-25 NOTE — ED Triage Notes (Signed)
Patient complains of abscess in right axilla that came up  "a while ago but recently festered up".

## 2018-03-25 NOTE — Discharge Instructions (Signed)
Change dressing daily.   He can bathe, just try not to remove the packing.  Wound recheck in 2 days.  Antibiotic as prescribed.  Take care  Dr. Adriana Simas

## 2018-03-27 ENCOUNTER — Ambulatory Visit
Admission: EM | Admit: 2018-03-27 | Discharge: 2018-03-27 | Disposition: A | Discharge status: Home / Self Care | Payer: Medicare Other

## 2018-03-27 DIAGNOSIS — L723 Sebaceous cyst: Secondary | ICD-10-CM

## 2018-03-27 DIAGNOSIS — Z5189 Encounter for other specified aftercare: Secondary | ICD-10-CM

## 2018-03-27 NOTE — ED Triage Notes (Signed)
Pt here for wound check under his right axilla that is currently bandaged. Wife states it is packed and we they think it's supposed to be removed today.

## 2018-03-27 NOTE — Discharge Instructions (Addendum)
.    Keep area dry until follow-up in 2 days.  Reinforce dressing as necessary.  Try to use less tape if possible

## 2018-03-27 NOTE — ED Provider Notes (Signed)
[facility]    CSN: [id number] Arrival date & time: 03/27/18  4098     History   Chief Complaint Chief Complaint  Patient presents with  . Wound Check    HPI Jerry Barrett is a 84 y.o. male.   HPI  Jerry Barrett returns for recheck of a abscess packing performed 03/25/2018.  He is to take his doxycycline as prescribed.  His wife has been reinforcing the dressing as necessary.  Does have a superficial reaction to the tape that she is using.  Had no fever or chills.          Past Medical History:  Diagnosis Date  . Diabetes mellitus without complication (HCC)   . Hyperglycemia   . Hypertension   . Thyroid disease     Patient Active Problem List   Diagnosis Date Noted  . SIRS (systemic inflammatory response syndrome) (HCC) 07/19/2017  . Influenza A 07/19/2017  . Diabetes (HCC) 07/19/2017  . HTN (hypertension) 07/19/2017  . Hypothyroidism 07/19/2017  . HLD (hyperlipidemia) 07/19/2017    Past Surgical History:  Procedure Laterality Date  . EYE SURGERY         Home Medications    Prior to Admission medications   Medication Sig Start Date End Date Taking? Authorizing Provider  aspirin EC 81 MG tablet Take 81 mg by mouth daily.   Yes [provider]  doxycycline (VIBRAMYCIN) 100 MG capsule Take 1 capsule (100 mg total) by mouth 2 (two) times daily. 03/25/18  Yes Cook, Jayce G, DO  levothyroxine (SYNTHROID, LEVOTHROID) 50 MCG tablet Take 50 mcg by mouth daily before breakfast.   Yes [provider]  lisinopril (PRINIVIL,ZESTRIL) 2.5 MG tablet Take 2.5 mg by mouth daily.   Yes [provider]  lovastatin (MEVACOR) 40 MG tablet Take 80 mg by mouth at bedtime.   Yes [provider]  metFORMIN (GLUCOPHAGE) 500 MG tablet Take 1.5 tablets (750 mg total) by mouth 2 (two) times daily with a meal. 07/21/17  Yes Altamese Dilling, MD  sildenafil (VIAGRA) 100 MG tablet Take 100 mg by mouth daily as needed for erectile  dysfunction.   Yes [provider]    Family History No family history on file.  Social History Social History   Tobacco Use  . Smoking status: Never Smoker  . Smokeless tobacco: Never Used  Substance Use Topics  . Alcohol use: No  . Drug use: Not on file     Allergies   Patient has no known allergies.   Review of Systems Review of Systems  Constitutional: Positive for activity change. Negative for appetite change, chills, fatigue and fever.  Skin: Positive for wound.  All other systems reviewed and are negative.    Physical Exam Triage Vital Signs ED Triage Vitals  Enc Vitals Group     BP 03/27/18 0813 (!) 150/84     Pulse Rate 03/27/18 0813 88     Resp 03/27/18 0813 18     Temp 03/27/18 0813 97.7 F (36.5 C)     Temp Source 03/27/18 0813 Oral     SpO2 03/27/18 0813 100 %     Weight --      Height --      Head Circumference --      Peak Flow --      Pain Score 03/27/18 0815 0     Pain Loc --      Pain Edu? --      Excl. in GC? --  No data found.  Updated Vital Signs BP (!) 150/84 (BP Location: Left Arm)   Pulse 88   Temp 97.7 F (36.5 C) (Oral)   Resp 18   SpO2 100%   Visual Acuity Right Eye Distance:   Left Eye Distance:   Bilateral Distance:    Right Eye Near:   Left Eye Near:    Bilateral Near:     Physical Exam  Constitutional: He is oriented to person, place, and time. He appears well-developed and well-nourished. No distress.  HENT:  Head: Normocephalic.  Eyes: Pupils are equal, round, and reactive to light. Right eye exhibits no discharge. Left eye exhibits no discharge.  Neck: Normal range of motion.  Musculoskeletal: Normal range of motion.  Neurological: He is alert and oriented to person, place, and time.  Skin: Skin is warm and dry. He is not diaphoretic. There is erythema.  Examination of the right axilla shows surrounding erythema over a large incision that is not currently draining any purulence.  He continues to  have induration surrounding the incision of approximately 1 cm diameter..  Nursing note and vitals reviewed.    UC Treatments / Results  Labs (all labs ordered are listed, but only abnormal results are displayed) Labs Reviewed - No data to display  EKG None  Radiology No results found.  Procedures Using aseptic technique the area was widely prepped with chlor hexedine scrub.  The previously placed packing was then removed in total.  New packing was then placed much lighter than previously.  A total of 2 inches of one quarter continuous gauze was then placed.  Dry sterile dressing was then applied over the wound.  The patient tolerated the procedure well.  He will continue with his oral antibiotics and return for another wound check in 2 days  Medications Ordered in UC Medications - No data to display  Initial Impression / Assessment and Plan / UC Course  I have reviewed the triage vital signs and the nursing notes.  Pertinent labs & imaging results that were available during my care of the patient were reviewed by me and considered in my medical decision making (see chart for details).     He will continue taking antibiotics as prescribed until it has been completed.  Follow-up in 2 days for removal and repacking as necessary.  In the meantime his wife will continue to reinforce the dressing as necessary. Final Clinical Impressions(s) / UC Diagnoses   Final diagnoses:  None   Discharge Instructions   None    ED Prescriptions    None     Controlled Substance Prescriptions [place] Controlled Substance Registry consulted? Not Applicable   Lutricia Feil, PA-C 03/27/18 1610

## 2018-03-29 ENCOUNTER — Ambulatory Visit
Admission: EM | Admit: 2018-03-29 | Discharge: 2018-03-29 | Disposition: A | Discharge status: Home / Self Care | Payer: Medicare Other | Attending: Emergency Medicine | Admitting: Emergency Medicine

## 2018-03-29 ENCOUNTER — Other Ambulatory Visit: Payer: Self-pay

## 2018-03-29 DIAGNOSIS — L723 Sebaceous cyst: Secondary | ICD-10-CM

## 2018-03-29 DIAGNOSIS — Z5189 Encounter for other specified aftercare: Secondary | ICD-10-CM

## 2018-03-29 MED ORDER — CHLORHEXIDINE GLUCONATE 4 % EX LIQD: Freq: Every day | CUTANEOUS | 0 refills | Status: DC | PRN

## 2018-03-29 MED ORDER — MUPIROCIN 2 % EX OINT: 1.0000 "application " | TOPICAL_OINTMENT | Freq: Three times a day (TID) | CUTANEOUS | 0 refills | Status: DC

## 2018-03-29 NOTE — ED Triage Notes (Signed)
Patient is here for a wound check to his right axilla. Patient had additional packing placed over the weekend and is here for removal.

## 2018-03-29 NOTE — ED Provider Notes (Signed)
HPI  SUBJECTIVE:  Jerry Barrett is a 84 y.o. male who presents for a wound check. Patient had an I&D of an infected sebaceous cyst of his right axilla 4 days ago.  Returned here 2 days ago, noted to have 1 cm induration surrounding the incision and it was repacked.  Here for packing removal today.  Taking doxycycline as prescribed.  Has 3 days left for a total of 7 days.  No fever, chills, increasing pain, redness, draining.  No complaints.  Past Medical History:  Diagnosis Date  . Diabetes mellitus without complication (HCC)   . Hyperglycemia   . Hypertension   . Thyroid disease     Past Surgical History:  Procedure Laterality Date  . EYE SURGERY      History reviewed. No pertinent family history.  Social History   Tobacco Use  . Smoking status: Never Smoker  . Smokeless tobacco: Never Used  Substance Use Topics  . Alcohol use: No  . Drug use: Not on file    No current facility-administered medications for this encounter.   Current Outpatient Medications:  .  aspirin EC 81 MG tablet, Take 81 mg by mouth daily., Disp: , Rfl:  .  doxycycline (VIBRAMYCIN) 100 MG capsule, Take 1 capsule (100 mg total) by mouth 2 (two) times daily., Disp: 14 capsule, Rfl: 0 .  levothyroxine (SYNTHROID, LEVOTHROID) 50 MCG tablet, Take 50 mcg by mouth daily before breakfast., Disp: , Rfl:  .  lisinopril (PRINIVIL,ZESTRIL) 2.5 MG tablet, Take 2.5 mg by mouth daily., Disp: , Rfl:  .  lovastatin (MEVACOR) 40 MG tablet, Take 80 mg by mouth at bedtime., Disp: , Rfl:  .  metFORMIN (GLUCOPHAGE) 500 MG tablet, Take 1.5 tablets (750 mg total) by mouth 2 (two) times daily with a meal., Disp: 60 tablet, Rfl: 0 .  sildenafil (VIAGRA) 100 MG tablet, Take 100 mg by mouth daily as needed for erectile dysfunction., Disp: , Rfl:  .  chlorhexidine (HIBICLENS) 4 % external liquid, Apply topically daily as needed. Dilute 10-15 mL in water, Use daily when bathing for 1-2 weeks, Disp: 120 mL, Rfl: 0 .  mupirocin  ointment (BACTROBAN) 2 %, Apply 1 application topically 3 (three) times daily., Disp: 22 g, Rfl: 0  No Known Allergies   ROS  As noted in HPI.   Physical Exam  BP 135/70 (BP Location: Right Arm)   Pulse 90   Temp 97.6 F (36.4 C) (Oral)   Resp 18   Ht 6' (1.829 m)   Wt 81.6 kg   SpO2 100%   BMI 24.41 kg/m   Constitutional: Well developed, well nourished, no acute distress Eyes:  EOMI, conjunctiva normal bilaterally HENT: Normocephalic, atraumatic,mucus membranes moist Respiratory: Normal inspiratory effort Cardiovascular: Normal rate GI: nondistended Skin: Large incision in the right axilla with packing intact.  1 cm nontender area of erythema, induration surrounding the surgical site.  No Expressible purulent drainage. Musculoskeletal: no deformities Neurologic: Alert & oriented x 3, no focal neuro deficits Psychiatric: Speech and behavior appropriate   ED Course   Medications - No data to display  No orders of the defined types were placed in this encounter.   No results found for this or any previous visit (from the past 24 hour(s)). No results found.  ED Clinical Impression  Wound check, abscess   ED Assessment/Plan  Procedure note: Cleaned the area with chlorhexidine, iodine, alcohol.  Removed packing in its entirety.  The cavity appears to be healing.  There  is no expressible purulent drainage.  No tenderness.  Still with 1 cm area of nontender erythema and induration surrounding this.  Did not repack this.  Had another dressing with paper tape placed.  Keep dressing on for another several days, then may change it daily.  Will send home with Bactroban, chlorhexidine soap.  He has an appointment with his primary care physician in several weeks.  He may follow-up with them at that time.  Return here for any worsening symptoms.  Discussed medical decision making, treatment plan, plan for follow-up with patient and spouse.  They agree with plan.  Meds  ordered this encounter  Medications  . mupirocin ointment (BACTROBAN) 2 %    Sig: Apply 1 application topically 3 (three) times daily.    Dispense:  22 g    Refill:  0  . chlorhexidine (HIBICLENS) 4 % external liquid    Sig: Apply topically daily as needed. Dilute 10-15 mL in water, Use daily when bathing for 1-2 weeks    Dispense:  120 mL    Refill:  0    *This clinic note was created using Scientist, clinical (histocompatibility and immunogenetics). Therefore, there may be occasional mistakes despite careful proofreading.   ?   Domenick Gong, MD 03/29/18 [phone]

## 2018-03-29 NOTE — Discharge Instructions (Addendum)
Keep this clean and dry for 2 days.  Then may change dressing daily.  Keep clean with chlorhexidine soap and water.  Apply the Bactroban with dressing changes.

## 2018-04-15 ENCOUNTER — Other Ambulatory Visit: Payer: Self-pay | Admitting: Family Medicine

## 2018-04-15 DIAGNOSIS — E1169 Type 2 diabetes mellitus with other specified complication: Secondary | ICD-10-CM

## 2018-04-15 DIAGNOSIS — R0989 Other specified symptoms and signs involving the circulatory and respiratory systems: Secondary | ICD-10-CM

## 2018-04-16 ENCOUNTER — Ambulatory Visit
Admission: RE | Admit: 2018-04-16 | Discharge: 2018-04-16 | Disposition: A | Discharge status: Home / Self Care | Payer: Medicare Other | Source: Ambulatory Visit | Attending: Family Medicine | Admitting: Family Medicine

## 2018-04-16 DIAGNOSIS — R0989 Other specified symptoms and signs involving the circulatory and respiratory systems: Secondary | ICD-10-CM | POA: Insufficient documentation

## 2018-04-16 DIAGNOSIS — E1169 Type 2 diabetes mellitus with other specified complication: Secondary | ICD-10-CM | POA: Insufficient documentation

## 2018-04-16 DIAGNOSIS — I6523 Occlusion and stenosis of bilateral carotid arteries: Secondary | ICD-10-CM | POA: Insufficient documentation

## 2018-04-19 ENCOUNTER — Other Ambulatory Visit: Payer: Self-pay | Admitting: Family Medicine

## 2018-04-19 DIAGNOSIS — R0989 Other specified symptoms and signs involving the circulatory and respiratory systems: Secondary | ICD-10-CM

## 2018-04-23 ENCOUNTER — Encounter: Payer: Self-pay | Admitting: Emergency Medicine

## 2018-04-23 ENCOUNTER — Ambulatory Visit
Admission: EM | Admit: 2018-04-23 | Discharge: 2018-04-23 | Disposition: A | Discharge status: Home / Self Care | Payer: Medicare Other | Attending: Family Medicine | Admitting: Family Medicine

## 2018-04-23 ENCOUNTER — Other Ambulatory Visit: Payer: Self-pay

## 2018-04-23 DIAGNOSIS — T148XXA Other injury of unspecified body region, initial encounter: Secondary | ICD-10-CM

## 2018-04-23 DIAGNOSIS — Z4800 Encounter for change or removal of nonsurgical wound dressing: Secondary | ICD-10-CM | POA: Diagnosis not present

## 2018-04-23 NOTE — ED Triage Notes (Signed)
Patient states that his wound in his right arm pit has not healed and still has some bleeding and drainage from the site.  Patient denies fevers and chills.

## 2018-04-23 NOTE — ED Provider Notes (Signed)
[facility]    CSN: [id number] Arrival date & time: 04/23/18  0807  History   Chief Complaint Chief Complaint  Patient presents with  . Wound Check   HPI  84 year old male presents for reevaluation regarding recent wound this may be from incision and drainage of sebaceous cyst.  I performed an incision and drainage approximately 1 month ago (R axilla).  He had appropriate follow-up and packing was removed.  He and his wife are worried as the wound has yet to completely heal.  The opening is small.  No purulent drainage.  Does bleed.  No fluctuance.  No fever.  No chills.  They have been applying daily dressing changes.  He has completed his antibiotic course.  No other associated symptoms.  No other complaints.  Social hx reviewed as below. Social History Social History   Tobacco Use  . Smoking status: Never Smoker  . Smokeless tobacco: Never Used  Substance Use Topics  . Alcohol use: No  . Drug use: Never   Allergies   Patient has no known allergies.   Review of Systems Review of Systems  Constitutional: Negative.   Skin: Positive for wound.   Physical Exam Triage Vital Signs ED Triage Vitals  Enc Vitals Group     BP 04/23/18 0833 134/77     Pulse Rate 04/23/18 0833 70     Resp 04/23/18 0833 16     Temp 04/23/18 0833 98 F (36.7 C)     Temp Source 04/23/18 0833 Oral     SpO2 04/23/18 0833 100 %     Weight 04/23/18 0834 180 lb (81.6 kg)     Height 04/23/18 0834 6' (1.829 m)     Head Circumference --      Peak Flow --      Pain Score 04/23/18 0833 0     Pain Loc --      Pain Edu? --      Excl. in GC? --    Updated Vital Signs BP 134/77 (BP Location: Left Arm)   Pulse 70   Temp 98 F (36.7 C) (Oral)   Resp 16   Ht 6' (1.829 m)   Wt 81.6 kg   SpO2 100%   BMI 24.41 kg/m   Visual Acuity Right Eye Distance:   Left Eye Distance:   Bilateral Distance:    Right Eye Near:   Left Eye Near:    Bilateral Near:     Physical Exam    Constitutional: He is oriented to person, place, and time. He appears well-developed. No distress.  HENT:  Head: Normocephalic and atraumatic.  Pulmonary/Chest: Effort normal. No respiratory distress.  Neurological: He is alert and oriented to person, place, and time.  Skin:  Right axilla -small open wound.  Mild bleeding.  No fluctuance.  No induration.  Psychiatric: He has a normal mood and affect. His behavior is normal.  Vitals reviewed.    UC Treatments / Results  Labs (all labs ordered are listed, but only abnormal results are displayed) Labs Reviewed - No data to display  EKG None  Radiology No results found.  Procedures Procedures (including critical care time)  Medications Ordered in UC Medications - No data to display  Initial Impression / Assessment and Plan / UC Course  I have reviewed the triage vital signs and the nursing notes.  Pertinent labs & imaging results that were available during my care of the patient were reviewed by me and considered in my  medical decision making (see chart for details).    84 year old male presents with an open wound.  Appears to be healing but has not fully closed.  Advised to continue dressing and conservative care.  If fails to resolve the next week or so, should see primary to arrange wound care referral.  Final Clinical Impressions(s) / UC Diagnoses   Final diagnoses:  Open wound     Discharge Instructions     Keep monitoring.  Keep it dressed (limit tape).  If doesn't heal, see PCP for referral to wound care.  Dr. Adriana Simas     ED Prescriptions    None     Controlled Substance Prescriptions [place] Controlled Substance Registry consulted? Not Applicable   Tommie Sams, [place] 04/23/18 4098

## 2018-04-23 NOTE — Discharge Instructions (Signed)
Keep monitoring.  Keep it dressed (limit tape).  If doesn't heal, see PCP for referral to wound care.  Dr. Adriana Simasook

## 2018-05-29 ENCOUNTER — Ambulatory Visit
Admission: EM | Admit: 2018-05-29 | Discharge: 2018-05-29 | Disposition: A | Discharge status: Home / Self Care | Payer: Medicare Other | Attending: Family Medicine | Admitting: Family Medicine

## 2018-05-29 ENCOUNTER — Other Ambulatory Visit: Payer: Self-pay

## 2018-05-29 DIAGNOSIS — S41101D Unspecified open wound of right upper arm, subsequent encounter: Secondary | ICD-10-CM | POA: Diagnosis not present

## 2018-05-29 MED ORDER — DOXYCYCLINE HYCLATE 100 MG PO CAPS: 100.0000 mg | ORAL_CAPSULE | Freq: Two times a day (BID) | ORAL | 0 refills | Status: DC

## 2018-05-29 NOTE — ED Provider Notes (Signed)
[facility]    CSN: [id number] Arrival date & time: 05/29/18  [id number]  History   Chief Complaint Chief Complaint  Patient presents with  . Wound Check   HPI  84 year old male presents for reevaluation of an open wound in his right axilla.  I saw the patient in October and drained an infected cyst.  He seemed to do well after the procedure but then returned as his wound never fully healed.  He currently has a small opening that still drains.  It drains what appears to be serous fluid.  Crusting.  No pus.  It is not painful.  It is bothersome due to the fact that it continues to drain and has never fully healed.  He has since seen his primary care physician.  Concern for pyogenic granuloma.  Continues to have trouble.  Presents today for reevaluation.  Wife is concerned that he needs antibiotic therapy.  No other complaints.  PMH, Surgical Hx, Family Hx, Social History reviewed and updated as below.  Past Medical History:  Diagnosis Date  . Diabetes mellitus without complication (HCC)   . Hyperglycemia   . Hypertension   . Thyroid disease    Patient Active Problem List   Diagnosis Date Noted  . SIRS (systemic inflammatory response syndrome) (HCC) 07/19/2017  . Influenza A 07/19/2017  . Diabetes (HCC) 07/19/2017  . HTN (hypertension) 07/19/2017  . Hypothyroidism 07/19/2017  . HLD (hyperlipidemia) 07/19/2017   Past Surgical History:  Procedure Laterality Date  . EYE SURGERY     Home Medications    Prior to Admission medications   Medication Sig Start Date End Date Taking? Authorizing Provider  doxycycline (VIBRAMYCIN) 100 MG capsule Take 1 capsule (100 mg total) by mouth 2 (two) times daily. 05/29/18   Tommie Samsook, Venna Berberich G, DO  levothyroxine (SYNTHROID, LEVOTHROID) 50 MCG tablet Take 50 mcg by mouth daily before breakfast.    [provider]  lisinopril (PRINIVIL,ZESTRIL) 2.5 MG tablet Take 2.5 mg by mouth daily.    [provider]  lovastatin (MEVACOR)  40 MG tablet Take 80 mg by mouth at bedtime.    [provider]  metFORMIN (GLUCOPHAGE) 500 MG tablet Take 1.5 tablets (750 mg total) by mouth 2 (two) times daily with a meal. 07/21/17   Altamese DillingVachhani, Vaibhavkumar, MD  sildenafil (VIAGRA) 100 MG tablet Take 100 mg by mouth daily as needed for erectile dysfunction.    [provider]   Social History Social History   Tobacco Use  . Smoking status: Never Smoker  . Smokeless tobacco: Never Used  Substance Use Topics  . Alcohol use: No  . Drug use: Never   Allergies   Patient has no known allergies.   Review of Systems Review of Systems  Constitutional: Negative for fever.  Skin: Positive for wound.   Physical Exam Triage Vital Signs ED Triage Vitals  Enc Vitals Group     BP 05/29/18 0828 (!) 159/76     Pulse Rate 05/29/18 0828 63     Resp 05/29/18 0828 16     Temp 05/29/18 0828 97.7 F (36.5 C)     Temp Source 05/29/18 0828 Oral     SpO2 05/29/18 0828 100 %     Weight 05/29/18 0829 179 lb (81.2 kg)     Height 05/29/18 0829 6' (1.829 m)     Head Circumference --      Peak Flow --      Pain Score 05/29/18 0829 0  Pain Loc --      Pain Edu? --      Excl. in GC? --    Updated Vital Signs BP (!) 159/76 (BP Location: Left Arm)   Pulse 63   Temp 97.7 F (36.5 C) (Oral)   Resp 16   Ht 6' (1.829 m)   Wt 81.2 kg   SpO2 100%   BMI 24.28 kg/m   Visual Acuity Right Eye Distance:   Left Eye Distance:   Bilateral Distance:    Right Eye Near:   Left Eye Near:    Bilateral Near:     Physical Exam Vitals signs and nursing note reviewed.  Constitutional:      General: He is not in acute distress. HENT:     Head: Normocephalic and atraumatic.     Nose: Nose normal.  Eyes:     General: No scleral icterus.    Conjunctiva/sclera: Conjunctivae normal.  Pulmonary:     Effort: Pulmonary effort is normal. No respiratory distress.  Skin:    Comments: Right axilla -there is a small open area which  continues to drain.  No underlying fluctuance or purulence.  Tissue is friable.  Neurological:     Mental Status: He is alert.  Psychiatric:        Mood and Affect: Mood normal.        Behavior: Behavior normal.    UC Treatments / Results  Labs (all labs ordered are listed, but only abnormal results are displayed) Labs Reviewed - No data to display  EKG None  Radiology No results found.  Procedures Procedures (including critical care time)  Medications Ordered in UC Medications - No data to display  Initial Impression / Assessment and Plan / UC Course  I have reviewed the triage vital signs and the nursing notes.  Pertinent labs & imaging results that were available during my care of the patient were reviewed by me and considered in my medical decision making (see chart for details).    84 year old male presents with a persistent open wound of his right axilla after an incision and drainage at urgent care.  I have advised him that I do not think this warrants antibiotics.  They are very concerned.  I have advised them that they can start the antibiotic if worsens or drains pus.  He needs to see dermatology.  I suspect that his primary care office is correct and this is a pyogenic granuloma.  It is possible that this is an underlying malignancy although seems unlikely given the fact that it had typical benign cyst contents when I drained it.  Final Clinical Impressions(s) / UC Diagnoses   Final diagnoses:  Open wound of right axillary region, subsequent encounter     Discharge Instructions     Antibiotic if worsens.  You need to see dermatology.  Call on Monday.  Take care  Dr. Adriana Simasook    ED Prescriptions    Medication Sig Dispense Auth. Provider   doxycycline (VIBRAMYCIN) 100 MG capsule Take 1 capsule (100 mg total) by mouth 2 (two) times daily. 14 capsule Tommie Samsook, Emrie Gayle G, DO     Controlled Substance Prescriptions [place] Controlled Substance Registry consulted? Not  Applicable   Tommie SamsCook, Maryse Brierley G, DO 05/29/18 0900

## 2018-05-29 NOTE — ED Triage Notes (Signed)
Pt with abscess in right axilla. Was seen here for same in October and saw Dr. Adriana Simasook. States Dr. Adriana Simasook told him to see a wound specialist.  PCP told pt he should see a dermatologist. Pt states it keeps draining and wants to have it checked.

## 2018-05-29 NOTE — Discharge Instructions (Signed)
Antibiotic if worsens.  You need to see dermatology.  Call on Monday.  Take care  Dr. Adriana Simasook

## 2019-09-06 DIAGNOSIS — R531 Weakness: Secondary | ICD-10-CM | POA: Insufficient documentation

## 2019-09-22 ENCOUNTER — Inpatient Hospital Stay: Payer: Medicare Other

## 2019-09-22 ENCOUNTER — Encounter: Payer: Self-pay | Admitting: Emergency Medicine

## 2019-09-22 ENCOUNTER — Inpatient Hospital Stay
Admission: EM | Admit: 2019-09-22 | Discharge: 2019-09-27 | DRG: 683 | Disposition: A | Discharge status: Home Health | Payer: Medicare Other | Source: Skilled Nursing Facility | Attending: Internal Medicine | Admitting: Internal Medicine

## 2019-09-22 ENCOUNTER — Other Ambulatory Visit: Payer: Self-pay

## 2019-09-22 ENCOUNTER — Emergency Department: Payer: Medicare Other

## 2019-09-22 DIAGNOSIS — R509 Fever, unspecified: Secondary | ICD-10-CM | POA: Diagnosis not present

## 2019-09-22 DIAGNOSIS — Z20822 Contact with and (suspected) exposure to covid-19: Secondary | ICD-10-CM | POA: Diagnosis present

## 2019-09-22 DIAGNOSIS — R000 Tachycardia, unspecified: Secondary | ICD-10-CM | POA: Diagnosis present

## 2019-09-22 DIAGNOSIS — R059 Cough, unspecified: Secondary | ICD-10-CM

## 2019-09-22 DIAGNOSIS — I959 Hypotension, unspecified: Secondary | ICD-10-CM | POA: Diagnosis present

## 2019-09-22 DIAGNOSIS — E875 Hyperkalemia: Secondary | ICD-10-CM | POA: Diagnosis not present

## 2019-09-22 DIAGNOSIS — Z515 Encounter for palliative care: Secondary | ICD-10-CM | POA: Diagnosis not present

## 2019-09-22 DIAGNOSIS — N179 Acute kidney failure, unspecified: Secondary | ICD-10-CM | POA: Diagnosis present

## 2019-09-22 DIAGNOSIS — I69359 Hemiplegia and hemiparesis following cerebral infarction affecting unspecified side: Secondary | ICD-10-CM

## 2019-09-22 DIAGNOSIS — Z7902 Long term (current) use of antithrombotics/antiplatelets: Secondary | ICD-10-CM | POA: Diagnosis not present

## 2019-09-22 DIAGNOSIS — E119 Type 2 diabetes mellitus without complications: Secondary | ICD-10-CM | POA: Diagnosis not present

## 2019-09-22 DIAGNOSIS — Z79899 Other long term (current) drug therapy: Secondary | ICD-10-CM

## 2019-09-22 DIAGNOSIS — I129 Hypertensive chronic kidney disease with stage 1 through stage 4 chronic kidney disease, or unspecified chronic kidney disease: Secondary | ICD-10-CM | POA: Diagnosis present

## 2019-09-22 DIAGNOSIS — I10 Essential (primary) hypertension: Secondary | ICD-10-CM

## 2019-09-22 DIAGNOSIS — E872 Acidosis: Secondary | ICD-10-CM | POA: Diagnosis present

## 2019-09-22 DIAGNOSIS — Z66 Do not resuscitate: Secondary | ICD-10-CM | POA: Diagnosis not present

## 2019-09-22 DIAGNOSIS — N1831 Chronic kidney disease, stage 3a: Secondary | ICD-10-CM | POA: Diagnosis present

## 2019-09-22 DIAGNOSIS — E1122 Type 2 diabetes mellitus with diabetic chronic kidney disease: Secondary | ICD-10-CM | POA: Diagnosis present

## 2019-09-22 DIAGNOSIS — E039 Hypothyroidism, unspecified: Secondary | ICD-10-CM | POA: Diagnosis present

## 2019-09-22 DIAGNOSIS — E869 Volume depletion, unspecified: Secondary | ICD-10-CM

## 2019-09-22 DIAGNOSIS — I69320 Aphasia following cerebral infarction: Secondary | ICD-10-CM | POA: Diagnosis not present

## 2019-09-22 DIAGNOSIS — F0280 Dementia in other diseases classified elsewhere without behavioral disturbance: Secondary | ICD-10-CM | POA: Diagnosis not present

## 2019-09-22 DIAGNOSIS — F0390 Unspecified dementia without behavioral disturbance: Secondary | ICD-10-CM | POA: Diagnosis present

## 2019-09-22 DIAGNOSIS — Z79890 Hormone replacement therapy (postmenopausal): Secondary | ICD-10-CM | POA: Diagnosis not present

## 2019-09-22 DIAGNOSIS — D631 Anemia in chronic kidney disease: Secondary | ICD-10-CM | POA: Diagnosis present

## 2019-09-22 DIAGNOSIS — I6930 Unspecified sequelae of cerebral infarction: Secondary | ICD-10-CM | POA: Diagnosis not present

## 2019-09-22 DIAGNOSIS — Z794 Long term (current) use of insulin: Secondary | ICD-10-CM

## 2019-09-22 DIAGNOSIS — R05 Cough: Secondary | ICD-10-CM

## 2019-09-22 DIAGNOSIS — Z7982 Long term (current) use of aspirin: Secondary | ICD-10-CM | POA: Diagnosis not present

## 2019-09-22 DIAGNOSIS — Z7189 Other specified counseling: Secondary | ICD-10-CM

## 2019-09-22 DIAGNOSIS — I69354 Hemiplegia and hemiparesis following cerebral infarction affecting left non-dominant side: Secondary | ICD-10-CM | POA: Diagnosis not present

## 2019-09-22 DIAGNOSIS — I69319 Unspecified symptoms and signs involving cognitive functions following cerebral infarction: Secondary | ICD-10-CM

## 2019-09-22 DIAGNOSIS — G309 Alzheimer's disease, unspecified: Secondary | ICD-10-CM | POA: Diagnosis not present

## 2019-09-22 HISTORY — DX: Unspecified dementia, unspecified severity, without behavioral disturbance, psychotic disturbance, mood disturbance, and anxiety: F03.90

## 2019-09-22 LAB — CBC WITH DIFFERENTIAL/PLATELET
Abs Immature Granulocytes: 0.02 10*3/uL (ref 0.00–0.07)
Basophils Absolute: 0.1 10*3/uL (ref 0.0–0.1)
Basophils Relative: 1 %
Eosinophils Absolute: 0.1 10*3/uL (ref 0.0–0.5)
Eosinophils Relative: 1 %
HCT: 39.5 % (ref 39.0–52.0)
Hemoglobin: 13.7 g/dL (ref 13.0–17.0)
Immature Granulocytes: 0 %
Lymphocytes Relative: 21 %
Lymphs Abs: 1.8 10*3/uL (ref 0.7–4.0)
MCH: 33.2 pg (ref 26.0–34.0)
MCHC: 34.7 g/dL (ref 30.0–36.0)
MCV: 95.6 fL (ref 80.0–100.0)
Monocytes Absolute: 0.7 10*3/uL (ref 0.1–1.0)
Monocytes Relative: 9 %
Neutro Abs: 6 10*3/uL (ref 1.7–7.7)
Neutrophils Relative %: 68 %
Platelets: 215 10*3/uL (ref 150–400)
RBC: 4.13 MIL/uL — ABNORMAL LOW (ref 4.22–5.81)
RDW: 13.6 % (ref 11.5–15.5)
WBC: 8.6 10*3/uL (ref 4.0–10.5)
nRBC: 0 % (ref 0.0–0.2)

## 2019-09-22 LAB — COMPREHENSIVE METABOLIC PANEL
ALT: 17 U/L (ref 0–44)
AST: 17 U/L (ref 15–41)
Albumin: 4.5 g/dL (ref 3.5–5.0)
Alkaline Phosphatase: 69 U/L (ref 38–126)
Anion gap: 14 (ref 5–15)
BUN: 109 mg/dL — ABNORMAL HIGH (ref 8–23)
CO2: 20 mmol/L — ABNORMAL LOW (ref 22–32)
Calcium: 9.4 mg/dL (ref 8.9–10.3)
Chloride: 105 mmol/L (ref 98–111)
Creatinine, Ser: 6.52 mg/dL — ABNORMAL HIGH (ref 0.61–1.24)
GFR calc Af Amer: 8 mL/min — ABNORMAL LOW (ref >60)
GFR calc non Af Amer: 7 mL/min — ABNORMAL LOW (ref >60)
Glucose, Bld: 137 mg/dL — ABNORMAL HIGH (ref 70–99)
Potassium: 6.1 mmol/L — ABNORMAL HIGH (ref 3.5–5.1)
Sodium: 139 mmol/L (ref 135–145)
Total Bilirubin: 1.2 mg/dL (ref 0.3–1.2)
Total Protein: 7.9 g/dL (ref 6.5–8.1)

## 2019-09-22 LAB — URINALYSIS, COMPLETE (UACMP) WITH MICROSCOPIC
Bacteria, UA: NONE SEEN
Bilirubin Urine: NEGATIVE
Glucose, UA: NEGATIVE mg/dL
Hgb urine dipstick: NEGATIVE
Ketones, ur: NEGATIVE mg/dL
Leukocytes,Ua: NEGATIVE
Nitrite: NEGATIVE
Protein, ur: 30 mg/dL — AB
Specific Gravity, Urine: 1.019 (ref 1.005–1.030)
Squamous Epithelial / HPF: NONE SEEN (ref 0–5)
pH: 5 (ref 5.0–8.0)

## 2019-09-22 LAB — URINE CULTURE
Culture: NO GROWTH
Special Requests: NORMAL

## 2019-09-22 LAB — CBC
HCT: 36.2 % — ABNORMAL LOW (ref 39.0–52.0)
Hemoglobin: 12.3 g/dL — ABNORMAL LOW (ref 13.0–17.0)
MCH: 33.2 pg (ref 26.0–34.0)
MCHC: 34 g/dL (ref 30.0–36.0)
MCV: 97.8 fL (ref 80.0–100.0)
Platelets: 173 10*3/uL (ref 150–400)
RBC: 3.7 MIL/uL — ABNORMAL LOW (ref 4.22–5.81)
RDW: 13.4 % (ref 11.5–15.5)
WBC: 8.2 10*3/uL (ref 4.0–10.5)
nRBC: 0 % (ref 0.0–0.2)

## 2019-09-22 LAB — BASIC METABOLIC PANEL
Anion gap: 11 (ref 5–15)
BUN: 102 mg/dL — ABNORMAL HIGH (ref 8–23)
CO2: 19 mmol/L — ABNORMAL LOW (ref 22–32)
Calcium: 8.9 mg/dL (ref 8.9–10.3)
Chloride: 112 mmol/L — ABNORMAL HIGH (ref 98–111)
Creatinine, Ser: 6.12 mg/dL — ABNORMAL HIGH (ref 0.61–1.24)
GFR calc Af Amer: 9 mL/min — ABNORMAL LOW (ref >60)
GFR calc non Af Amer: 8 mL/min — ABNORMAL LOW (ref >60)
Glucose, Bld: 123 mg/dL — ABNORMAL HIGH (ref 70–99)
Potassium: 4.7 mmol/L (ref 3.5–5.1)
Sodium: 142 mmol/L (ref 135–145)

## 2019-09-22 LAB — GLUCOSE, CAPILLARY
Glucose-Capillary: 107 mg/dL — ABNORMAL HIGH (ref 70–99)
Glucose-Capillary: 97 mg/dL (ref 70–99)
Glucose-Capillary: 92 mg/dL (ref 70–99)
Glucose-Capillary: 83 mg/dL (ref 70–99)

## 2019-09-22 LAB — HEMOGLOBIN A1C
Hgb A1c MFr Bld: 7.4 % — ABNORMAL HIGH (ref 4.8–5.6)
Mean Plasma Glucose: 165.68 mg/dL

## 2019-09-22 LAB — MAGNESIUM: Magnesium: 2.8 mg/dL — ABNORMAL HIGH (ref 1.7–2.4)

## 2019-09-22 LAB — PHOSPHORUS: Phosphorus: 6.5 mg/dL — ABNORMAL HIGH (ref 2.5–4.6)

## 2019-09-22 LAB — TSH: TSH: 2.737 u[IU]/mL (ref 0.350–4.500)

## 2019-09-22 LAB — LIPASE, BLOOD: Lipase: 43 U/L (ref 11–51)

## 2019-09-22 LAB — SARS CORONAVIRUS 2 BY RT PCR (DIASORIN): SARS Coronavirus 2: NEGATIVE

## 2019-09-22 LAB — T4, FREE: Free T4: 0.95 ng/dL (ref 0.61–1.12)

## 2019-09-22 MED ORDER — SODIUM CHLORIDE 0.9 % IV BOLUS
1000.0000 mL | Freq: Once | INTRAVENOUS | Status: AC
  Administered 2019-09-22: 1000 mL via INTRAVENOUS

## 2019-09-22 MED ORDER — INSULIN ASPART 100 UNIT/ML ~~LOC~~ SOLN
0.0000 [IU] | Freq: Three times a day (TID) | SUBCUTANEOUS | Status: DC
  Administered 2019-09-23: 2 [IU] via SUBCUTANEOUS
  Administered 2019-09-23: 1 [IU] via SUBCUTANEOUS
  Filled 2019-09-22 (×2): qty 1

## 2019-09-22 MED ORDER — CALCIUM GLUCONATE-NACL 1-0.675 GM/50ML-% IV SOLN
1.0000 g | Freq: Once | INTRAVENOUS | Status: AC
  Administered 2019-09-22: 02:00:00 1000 mg via INTRAVENOUS
  Filled 2019-09-22: qty 50

## 2019-09-22 MED ORDER — ONDANSETRON HCL 4 MG/2ML IJ SOLN: 4.0000 mg | Freq: Four times a day (QID) | INTRAMUSCULAR | Status: DC | PRN

## 2019-09-22 MED ORDER — SODIUM ZIRCONIUM CYCLOSILICATE 10 G PO PACK
10.0000 g | PACK | ORAL | Status: AC
  Administered 2019-09-22: 10 g via ORAL
  Filled 2019-09-22: qty 1

## 2019-09-22 MED ORDER — HEPARIN SODIUM (PORCINE) 5000 UNIT/ML IJ SOLN
5000.0000 [IU] | Freq: Three times a day (TID) | INTRAMUSCULAR | Status: DC
  Administered 2019-09-23 – 2019-09-24 (×5): 5000 [IU] via SUBCUTANEOUS
  Filled 2019-09-22 (×5): qty 1

## 2019-09-22 MED ORDER — ALBUTEROL SULFATE (2.5 MG/3ML) 0.083% IN NEBU
5.0000 mg | INHALATION_SOLUTION | Freq: Once | RESPIRATORY_TRACT | Status: AC
  Administered 2019-09-22: 02:00:00 5 mg via RESPIRATORY_TRACT
  Filled 2019-09-22: qty 6

## 2019-09-22 MED ORDER — ONDANSETRON HCL 4 MG PO TABS: 4.0000 mg | ORAL_TABLET | Freq: Four times a day (QID) | ORAL | Status: DC | PRN

## 2019-09-22 MED ORDER — ACETAMINOPHEN 325 MG PO TABS
650.0000 mg | ORAL_TABLET | Freq: Four times a day (QID) | ORAL | Status: DC | PRN
  Administered 2019-09-23: 650 mg via ORAL
  Filled 2019-09-22: qty 2

## 2019-09-22 MED ORDER — SODIUM CHLORIDE 0.9 % IV SOLN: 1.0000 g | Freq: Once | INTRAVENOUS | Status: DC

## 2019-09-22 MED ORDER — ENOXAPARIN SODIUM 30 MG/0.3ML ~~LOC~~ SOLN
30.0000 mg | SUBCUTANEOUS | Status: DC
  Administered 2019-09-22: 30 mg via SUBCUTANEOUS
  Filled 2019-09-22: qty 0.3

## 2019-09-22 MED ORDER — ACETAMINOPHEN 650 MG RE SUPP: 650.0000 mg | Freq: Four times a day (QID) | RECTAL | Status: DC | PRN

## 2019-09-22 MED ORDER — SODIUM CHLORIDE 0.9 % IV SOLN: INTRAVENOUS | Status: DC

## 2019-09-22 NOTE — H&P (Signed)
History and Physical    ADEOLUWA Barrett ANV:[id number] DOB: 1933-01-05 DOA: 09/22/2019  PCP: [facility]   Patient coming from: [facility]  I have personally briefly reviewed patient's old medical records in [facility]  Chief Complaint: Abnormal creatinine  HPI: Jerry Barrett is a 86 y.o. male with medical history significant for recent CVA 09/02/19 received TPA complicated by small intraparenchymal bleed, with no CVA on MRI but with residual left hemiparesis and aphasia currently at rehab, who also has history of diabetes, hypertension, hypothyroidism who was sent to the emergency room for evaluation after routine blood work returned with elevated creatinine above 6.  On April 3 creatinine was 1.2, on April 8 it was 2.1.  Most of the history is compiled by ER records as patient is partially verbal.  ED Course: In the emergency room vitals were mostly unremarkable except for soft blood pressure.  BP on arrival was 120/54 subsequently 104/50.  Blood work significant for creatinine of 6.52 with hyperkalemia of 6.1 only mild metabolic acidosis of 20.  Urinalysis unremarkable.  He had elevated magnesium of 2.8 and phosphorus of 6.5.  Urinary catheterization in the ER returned only 50 mils urine.    Patient received Lokelma and calcium gluconate in the emergency room.CT abdomen and pelvis and CT head requested at time of admission.  Review of Systems: Difficult to obtain as patient is partially verbal  Past Medical History:  Diagnosis Date  . Dementia (HCC)   . Diabetes mellitus without complication (HCC)   . Hyperglycemia   . Hypertension   . Thyroid disease     Past Surgical History:  Procedure Laterality Date  . EYE SURGERY       reports that he has never smoked. He has never used smokeless tobacco. He reports that he does not drink alcohol or use drugs.  No Known Allergies  History reviewed. No pertinent family history.   Prior to Admission  medications   Medication Sig Start Date End Date Taking? Authorizing Provider  acetaminophen (TYLENOL) 325 MG tablet Take 650 mg by mouth every 6 (six) hours as needed.   Yes [provider]  aspirin EC 81 MG tablet Take 81 mg by mouth daily.   Yes [provider]  atorvastatin (LIPITOR) 20 MG tablet Take 20 mg by mouth daily.   Yes [provider]  bisacodyl (DULCOLAX) 10 MG suppository Place 10 mg rectally as needed for moderate constipation.   Yes [provider]  enoxaparin (LOVENOX) 40 MG/0.4ML injection Inject 40 mg into the skin daily.   Yes [provider]  Glucagon HCl 1 MG SOLR Inject 1 mL as directed as needed.   Yes [provider]  insulin lispro (HUMALOG KWIKPEN) 100 UNIT/ML KwikPen Inject 0-4 Units into the skin 3 (three) times daily. Per sliding scale 61-200= 0 units 201-250=2 units 251-300=3 units 301-350=4 units   Yes [provider]  levothyroxine (SYNTHROID, LEVOTHROID) 50 MCG tablet Take 50 mcg by mouth daily before breakfast.   Yes [provider]  lisinopril (ZESTRIL) 10 MG tablet Take 10 mg by mouth daily.    Yes [provider]  melatonin 3 MG TABS tablet Take 3 mg by mouth at bedtime.   Yes [provider]  metFORMIN (GLUCOPHAGE) 500 MG tablet Take 1.5 tablets (750 mg total) by mouth 2 (two) times daily with a meal. Patient taking differently: Take 1,000 mg by mouth 2 (two) times daily with a meal.  07/21/17  Yes Vaughan Basta, MD  ondansetron (ZOFRAN) 4 MG tablet Take 4 mg by mouth every 8 (eight) hours as needed for nausea or vomiting.   Yes [provider]  sennosides-docusate sodium (SENOKOT-S) 8.6-50 MG tablet Take 1 tablet by mouth 2 (two) times daily.   Yes [provider]  doxycycline (VIBRAMYCIN) 100 MG capsule Take 1 capsule (100 mg total) by mouth 2 (two) times daily. Patient not taking: Reported on 09/22/2019 05/29/18   Jerry Spikes, DO    lovastatin (MEVACOR) 40 MG tablet Take 80 mg by mouth at bedtime.    [provider]  sildenafil (VIAGRA) 100 MG tablet Take 100 mg by mouth daily as needed for erectile dysfunction.    [provider]    Physical Exam: Vitals:   09/22/19 0027 09/22/19 0030 09/22/19 0100 09/22/19 0130  BP: (!) 120/54 118/62 (!) 104/59 (!) 104/50  Pulse: 76 72 71 72  Resp: 13 13 13 13   Temp: 97.9 F (36.6 C)     TempSrc: Oral     SpO2: 98% 96% 100% 100%  Weight:      Height:         Vitals:   09/22/19 0027 09/22/19 0030 09/22/19 0100 09/22/19 0130  BP: (!) 120/54 118/62 (!) 104/59 (!) 104/50  Pulse: 76 72 71 72  Resp: 13 13 13 13   Temp: 97.9 F (36.6 C)     TempSrc: Oral     SpO2: 98% 96% 100% 100%  Weight:      Height:        Constitutional: A bit somnolent but, not in any acute distress. Eyes: PERLA, EOMI, irises appear normal, anicteric sclera,  ENMT: external ears and nose appear normal, normal hearing             Lips appears normal,Neck: neck appears normal, no masses, normal ROM, no thyromegaly, no JVD  CVS: S1-S2 clear, no murmur rubs or gallops,  , no carotid bruits, pedal pulses palpable, No LE edema Respiratory:  clear to auscultation bilaterally, no wheezing, rales or rhonchi. Respiratory effort normal. No accessory muscle use.  Abdomen: soft nontender, nondistended, normal bowel sounds, no hepatosplenomegaly, no hernias Musculoskeletal: : no cyanosis, clubbing , no contractures or atrophy Neuro: mild weakness left side Psych: Difficult to assess due to somnolence skin: no rashes or lesions or ulcers, no induration or nodules   Labs on Admission: I have personally reviewed following labs and imaging studies  CBC: Recent Labs  Lab 09/22/19 0039  WBC 8.6  NEUTROABS 6.0  HGB 13.7  HCT 39.5  MCV 95.6  PLT 798   Basic Metabolic Panel: Recent Labs  Lab 09/22/19 0039  NA 139  K 6.1*  CL 105  CO2 20*  GLUCOSE 137*  BUN 109*  CREATININE 6.52*   CALCIUM 9.4  MG 2.8*  PHOS 6.5*   GFR: Estimated Creatinine Clearance: 9.1 mL/min (A) (by C-G formula based on SCr of 6.52 mg/dL (H)). Liver Function Tests: Recent Labs  Lab 09/22/19 0039  AST 17  ALT 17  ALKPHOS 69  BILITOT 1.2  PROT 7.9  ALBUMIN 4.5   Recent Labs  Lab 09/22/19 0039  LIPASE 43   No results for input(s): AMMONIA in the last 168 hours. Coagulation Profile: No results for input(s): INR, PROTIME in the last 168 hours. Cardiac Enzymes: No results for input(s): CKTOTAL, CKMB, CKMBINDEX, TROPONINI in the last 168 hours. BNP (last 3 results) No results for input(s): PROBNP in the last  8760 hours. HbA1C: No results for input(s): HGBA1C in the last 72 hours. CBG: No results for input(s): GLUCAP in the last 168 hours. Lipid Profile: No results for input(s): CHOL, HDL, LDLCALC, TRIG, CHOLHDL, LDLDIRECT in the last 72 hours. Thyroid Function Tests: Recent Labs    09/22/19 0039  FREET4 0.95   Anemia Panel: No results for input(s): VITAMINB12, FOLATE, FERRITIN, TIBC, IRON, RETICCTPCT in the last 72 hours. Urine analysis:    Component Value Date/Time   COLORURINE YELLOW (A) 09/22/2019 0039   APPEARANCEUR CLEAR (A) 09/22/2019 0039   LABSPEC 1.019 09/22/2019 0039   PHURINE 5.0 09/22/2019 0039   GLUCOSEU NEGATIVE 09/22/2019 0039   HGBUR NEGATIVE 09/22/2019 0039   BILIRUBINUR NEGATIVE 09/22/2019 0039   KETONESUR NEGATIVE 09/22/2019 0039   PROTEINUR 30 (A) 09/22/2019 0039   NITRITE NEGATIVE 09/22/2019 0039   LEUKOCYTESUR NEGATIVE 09/22/2019 0039    Radiological Exams on Admission: No results found.  EKG: Independently reviewed.   Assessment/Plan Principal Problem:   AKI (acute kidney injury) (HCC)   Hyperkalemia -Possibly prerenal, related to decreased oral intake.  No reports of nausea vomiting.  Urinalysis negative -Follow-up CT abdomen and pelvis to rule out obstruction, less likely given straight cath return of 50 mils urine -Follow-up CT head  to evaluate for possible stroke/troke extension that may be causing decreased oral intake -IV hydration  -Patient was [facility] in the ER with [facility] -Monitor potassium and correct as needed -Monitor renal function and avoid nephrotoxins -Consider nephrology consult if not improving with IV hydration    Diabetes (HCC) -Sliding scale insulin coverage    HTN (hypertension) -Hold antihypertensives given soft blood pressure    Hypothyroidism -Continue levothyroxine pending med rec -Follow TSH    History of CVA on 09/02/19 treated with tPA with complication of small intraparenchymal bleed   Hemiparesis and other late effects of cerebrovascular accident (HCC) -Follow CT head -Continue aspirin and statin -Increased nursing assistance    DVT prophylaxis: Lovenox Code Status: DNR  Family Communication:  none  Disposition Plan: Back to previous home environment Consults called: none  Status:inp    Andris Baumann MD [facility]     09/22/2019, 2:43 AM

## 2019-09-22 NOTE — Consult Note (Signed)
[address] [facility] [place], [place] [address] Phone [phone]. Fax [phone]  Date: 09/22/2019                  Patient Name:  Jerry Barrett  MRN: [id number]  DOB: 07/08/1932  Age / Sex: 86 y.o., male         PCP: [place], [facility]                 Service Requesting Consult:  Internal medicine/ Delfino LovettShah, Vipul, MD                 Reason for Consult:  Acute renal failure            History of Present Illness: Patient is a 86 y.o. male with medical problems of recent stroke, who [facility]2019 for evaluation of acute kidney injury.  Patient presented to the ED via EMS from [facility] for abnormal renal labs. Wife and niece at bedside relating information They report that patient had stroke and [facility]  He was then sent to rehab and then discharged to [facility] nursing home They report that he was able to work with physical therapy and walk He was also able to eat although his appetite has been low lately Currently patient is in mittens and is able to follow only very simple commands   Medications: Outpatient medications: Medications Prior to Admission  Medication Sig Dispense Refill Last Dose  . acetaminophen (TYLENOL) 325 MG tablet Take 650 mg by mouth every 6 (six) hours as needed.   prn at prn  . aspirin EC 81 MG tablet Take 81 mg by mouth daily.   09/21/2019 at 0900  . atorvastatin (LIPITOR) 20 MG tablet Take 20 mg by mouth daily.   09/21/2019 at 2000  . bisacodyl (DULCOLAX) 10 MG suppository Place 10 mg rectally as needed for moderate constipation.   prn at prn  . enoxaparin (LOVENOX) 40 MG/0.4ML injection Inject 40 mg into the skin daily.   09/21/2019 at 0900  . Glucagon HCl 1 MG SOLR Inject 1 mL as directed as needed.   prn at prn  . insulin lispro (HUMALOG KWIKPEN) 100 UNIT/ML KwikPen Inject 0-4 Units into the skin 3 (three) times daily. Per sliding scale 61-200= 0 units 201-250=2 units 251-300=3  units 301-350=4 units   09/21/2019 at 1730  . levothyroxine (SYNTHROID, LEVOTHROID) 50 MCG tablet Take 50 mcg by mouth daily before breakfast.   09/21/2019 at 0600  . lisinopril (ZESTRIL) 10 MG tablet Take 10 mg by mouth daily.    09/21/2019 at 0900  . melatonin 3 MG TABS tablet Take 3 mg by mouth at bedtime.   09/21/2019 at 2000  . metFORMIN (GLUCOPHAGE) 500 MG tablet Take 1.5 tablets (750 mg total) by mouth 2 (two) times daily with a meal. (Patient taking differently: Take 1,000 mg by mouth 2 (two) times daily with a meal. ) 60 tablet 0 09/21/2019 at 1830  . ondansetron (ZOFRAN) 4 MG tablet Take 4 mg by mouth every 8 (eight) hours as needed for nausea or vomiting.   prn at prn  . sennosides-docusate sodium (SENOKOT-S) 8.6-50 MG tablet Take 1 tablet by mouth 2 (two) times daily.   09/21/2019 at 1800  . doxycycline (VIBRAMYCIN) 100 MG capsule Take 1 capsule (100 mg total) by mouth 2 (two) times daily. (Patient not taking: Reported on 09/22/2019) 14 capsule 0 Not Taking at Unknown time  . lovastatin (MEVACOR) 40  MG tablet Take 80 mg by mouth at bedtime.   Not Taking at Unknown time  . sildenafil (VIAGRA) 100 MG tablet Take 100 mg by mouth daily as needed for erectile dysfunction.   Not Taking at Unknown time    Current medications: Current Facility-Administered Medications  Medication Dose Route Frequency Provider Last Rate Last Admin  . 0.9 %  sodium chloride infusion   Intravenous Continuous Andris Baumann, MD 100 mL/hr at 09/22/19 0510 New Bag at 09/22/19 0510  . acetaminophen (TYLENOL) tablet 650 mg  650 mg Oral Q6H PRN Andris Baumann, MD       Or  . acetaminophen (TYLENOL) suppository 650 mg  650 mg Rectal Q6H PRN Andris Baumann, MD      . enoxaparin (LOVENOX) injection 30 mg  30 mg Subcutaneous Q24H Lindajo Royal V, MD   30 mg at 09/22/19 1013  . insulin aspart (novoLOG) injection 0-15 Units  0-15 Units Subcutaneous TID WC Lindajo Royal V, MD      . ondansetron [facility]) tablet 4 mg  4 mg Oral  Q6H PRN Andris Baumann, MD       Or  . ondansetron [facility]) injection 4 mg  4 mg Intravenous Q6H PRN Andris Baumann, MD          Allergies: No Known Allergies    Past Medical History: Past Medical History:  Diagnosis Date  . Dementia (HCC)   . Diabetes mellitus without complication (HCC)   . Hyperglycemia   . Hypertension   . Thyroid disease      Past Surgical History: Past Surgical History:  Procedure Laterality Date  . EYE SURGERY       Family History: History reviewed. No pertinent family history.   Social History: Social History   Socioeconomic History  . Marital status: Married    Spouse name: Not on file  . Number of children: Not on file  . Years of education: Not on file  . Highest education level: Not on file  Occupational History  . Not on file  Tobacco Use  . Smoking status: Never Smoker  . Smokeless tobacco: Never Used  Substance and Sexual Activity  . Alcohol use: No  . Drug use: Never  . Sexual activity: Not on file  Other Topics Concern  . Not on file  Social History Narrative  . Not on file   Social Determinants of Health   Financial Resource Strain:   . Difficulty of Paying Living Expenses:   Food Insecurity:   . Worried About Programme researcher, broadcasting/film/video in the Last Year:   . Barista in the Last Year:   Transportation Needs:   . Freight forwarder (Medical):   Marland Kitchen Lack of Transportation (Non-Medical):   Physical Activity:   . Days of Exercise per Week:   . Minutes of Exercise per Session:   Stress:   . Feeling of Stress :   Social Connections:   . Frequency of Communication with Friends and Family:   . Frequency of Social Gatherings with Friends and Family:   . Attends Religious Services:   . Active Member of Clubs or Organizations:   . Attends Banker Meetings:   Marland Kitchen Marital Status:   Intimate Partner Violence:   . Fear of Current or Ex-Partner:   . Emotionally Abused:   Marland Kitchen Physically Abused:   . Sexually  Abused:      Review of Systems: Not available Gen:  HEENT:  CV:  Resp:  GI: GU :  MS:  Derm:    Psych: Heme:  Neuro:  Endocrine  Vital Signs: Blood pressure 130/66, pulse 82, temperature (!) 97.4 F (36.3 C), temperature source Oral, resp. rate 18, height 6' (1.829 m), weight 83.9 kg, SpO2 100 %.   Intake/Output Summary (Last 24 hours) at 09/22/2019 1117 Last data filed at 09/22/2019 3875 Gross per 24 hour  Intake 1050 ml  Output 150 ml  Net 900 ml    Weight trends: [employer]   09/22/19 0026  Weight: 83.9 kg    Physical Exam: General:  Chronically ill-appearing, laying in the bed  HEENT  anicteric, moist oral mucous membrane  Neck:  Supple, no thyromegaly  Lungs:  Normal breathing effort, clear to auscultation  Heart::  No rub  Abdomen:  Soft, nontender, fullness at lower abdomen  Extremities:  No peripheral edema  Neurologic:  Alert, able to follow few simple commands  Skin:  No acute rashes    Lab results: Basic Metabolic Panel: Recent Labs  Lab 09/22/19 0039 09/22/19 0540  NA 139 142  K 6.1* 4.7  CL 105 112*  CO2 20* 19*  GLUCOSE 137* 123*  BUN 109* 102*  CREATININE 6.52* 6.12*  CALCIUM 9.4 8.9  MG 2.8*  --   PHOS 6.5*  --     Liver Function Tests: Recent Labs  Lab 09/22/19 0039  AST 17  ALT 17  ALKPHOS 69  BILITOT 1.2  PROT 7.9  ALBUMIN 4.5   Recent Labs  Lab 09/22/19 0039  LIPASE 43   No results for input(s): AMMONIA in the last 168 hours.  CBC: Recent Labs  Lab 09/22/19 0039 09/22/19 0540  WBC 8.6 8.2  NEUTROABS 6.0  --   HGB 13.7 12.3*  HCT 39.5 36.2*  MCV 95.6 97.8  PLT 215 173    Cardiac Enzymes: No results for input(s): CKTOTAL, TROPONINI in the last 168 hours.  BNP: Invalid input(s): POCBNP  CBG: Recent Labs  Lab 09/22/19 0829  GLUCAP 107*    Microbiology: Recent Results (from the past 720 hour(s))  SARS Coronavirus 2 by RT PCR     Status: None   Collection Time: 09/22/19 12:39 AM  Result  Value Ref Range Status   SARS Coronavirus 2 NEGATIVE NEGATIVE Final    Comment: (NOTE) Result indicates the ABSENCE of SARS-CoV-2 RNA in the patient specimen.  The lowest concentration of SARS-CoV-2 viral copies this assay can detect in nasopharyngeal swab specimens is 500 copies / mL.  A negative result does not preclude SARS-CoV-2 infection and should not be used as the sole basis for patient management decisions. A negative result may occur with improper specimen collection / handling, submission of a specimen other than nasopharyngeal swab, presence of viral mutation(s) within the areas targeted by this assay, and inadequate number of viral copies (<500 copies / mL) present.  Negative results must be combined with clinical observations, patient history, and epidemiological information.  The expected result is NEGATIVE.  Patient Fact Sheet:  BlogSelections.co.uk   Provider Fact Sheet:  https://lucas.com/   This test is not yet approved or cleared by the [place] FDA and  has been authorized for  detection and/or diagnosis of SARS-CoV-2 by FDA under an Emergency Use Authorization (EUA).  This EUA will remain in effect (meaning this test can be used) for the duration of  the COVID-19 declaration under Section 564(b)(1) of the Act, 21 U.S.C. section 360bbb-3(b)(1), unless the authorization is terminated  or revoked sooner Performed at [facility], [address] [place], [place] [address]      Coagulation Studies: No results for input(s): LABPROT, INR in the last 72 hours.  Urinalysis: Recent Labs    09/22/19 0039  COLORURINE YELLOW*  LABSPEC 1.019  PHURINE 5.0  GLUCOSEU NEGATIVE  HGBUR NEGATIVE  BILIRUBINUR NEGATIVE  KETONESUR NEGATIVE  PROTEINUR 30*  NITRITE NEGATIVE  LEUKOCYTESUR NEGATIVE        Imaging: CT HEAD WO CONTRAST  Result Date: 09/22/2019 CLINICAL DATA:  Dementia, nonverbal, previous stroke  EXAM: CT HEAD WITHOUT CONTRAST TECHNIQUE: Contiguous axial images were obtained from the base of the skull through the vertex without intravenous contrast. COMPARISON:  None. FINDINGS: Brain: No acute infarct or hemorrhage. Lateral ventricles and midline structures are unremarkable. No acute extra-axial fluid collections. No mass effect. Vascular: No hyperdense vessel or unexpected calcification. Skull: Normal. Negative for fracture or focal lesion. Sinuses/Orbits: The paranasal sinuses are clear. Small amount of gas in the right cavernous sinus is likely an incidental finding and related to IV placement. Other: None. IMPRESSION: 1. No acute intracranial process. Electronically Signed   By: Sharlet Salina M.D.   On: 09/22/2019 03:07   CT Renal Stone Study  Result Date: 09/22/2019 CLINICAL DATA:  Acute renal failure, dementia EXAM: CT ABDOMEN AND PELVIS WITHOUT CONTRAST TECHNIQUE: Multidetector CT imaging of the abdomen and pelvis was performed following the standard protocol without IV contrast. COMPARISON:  None. FINDINGS: Lower chest: No acute pleural or parenchymal lung disease. Extensive coronary artery atherosclerosis. Hepatobiliary: No focal liver abnormality is seen. No gallstones, gallbladder wall thickening, or biliary dilatation. Pancreas: Unremarkable. No pancreatic ductal dilatation or surrounding inflammatory changes. Spleen: Normal in size without focal abnormality. Adrenals/Urinary Tract: No urinary tract calculi or obstructive uropathy. The bladder is only minimally distended, which limits its evaluation. The adrenals are normal. Stomach/Bowel: No bowel obstruction or ileus. Moderate retained stool within the colon. No bowel wall thickening or inflammatory change. Vascular/Lymphatic: Aortic atherosclerosis. No enlarged abdominal or pelvic lymph nodes. Reproductive: Prostate is unremarkable. Other: No free fluid or free gas. No evidence of hernia. Likely sebaceous cyst just superior to the base of  the penis within the midline lower anterior abdominal wall. Musculoskeletal: No acute or destructive bony lesions. Reconstructed images demonstrate no additional findings. IMPRESSION: 1. No urinary tract calculi or obstructive uropathy. 2. No acute intra-abdominal or intrapelvic process. 3. Atherosclerosis. Electronically Signed   By: Sharlet Salina M.D.   On: 09/22/2019 03:00      Assessment & Plan: Pt is a 86 y.o.   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 AKI  Baseline Cr 1.2/GFR 55 from 09/10/2019 Admit cr 6.52 improved to 6.12 today Renal imaging (CT ) neg for obstruction (CT without contrast) 09/22/2019 U/a protein 30 mg/dL, neg for blood, 0-5 WBCs Patient was on lisinopril as outpatient.  ACE inhibitor in the setting of hypotension may have caused severe ATN Patient appeared to have lower abdominal fullness and a bedside bladder scan indicated more than 300 cc of urine.  Nurse tried to place Foley but could not advance the catheter therefore renal ultrasound was ordered due  to concern of urinary retention.  If truly patient has urinary retention, may need urology assistance for placement of Foley catheter. BUN and creatinine are critically elevated If serum creatinine does not improve, patient may need dialysis out of concern for uremia associated mental status changes In the meantime, continue IV hydration  We will follow closely           LOS: 0 Aneka Fagerstrom Thedore Mins 4/15/202111:17 AM    Note: This note was prepared with Dragon dictation. Any  transcription errors are unintentional

## 2019-09-22 NOTE — ED Triage Notes (Signed)
Pt to ED via EMS from [facility]. Per facility pt has abnormal kidney labs. Pt has hx of dementia, non verbal at this time. Pt has left side deficit from previous stroke. Pt denies pain

## 2019-09-22 NOTE — Progress Notes (Signed)
Same day rounding progress note  Patient seen and examined.  Wife and niece at bedside.  Patient sleepy and confused. Patient was at [facility] in March for stroke after which he was transferred to [facility] for rehabilitation from there he is coming  AKI (acute kidney injury) (HCC)   Hyperkalemia -Likely prerenal, related to decreased oral intake.  No reports of nausea vomiting.  Urinalysis negative -Wife shows report from [facility] - concerning for protein in the urine in the lab in January 2021 -CT abdomen and pelvis neg for obstruction -CT head neg for acute pathology -continue IV hydration  -Patient was treated in the ER with [facility] -Monitor potassium and correct as needed -Monitor renal function and avoid nephrotoxins.  Creatinine improved little with hydration -Nephrology consult     Diabetes (HCC) -Sliding scale insulin coverage    HTN (hypertension) -Hold antihypertensives given soft blood pressure    Hypothyroidism -Continue levothyroxine pending med rec -normal TSH    History of CVA on 09/02/19 treated with tPA with complication of small intraparenchymal bleed   Hemiparesis and other late effects of cerebrovascular accident [facility] -CT head not showing any acute pathology or bleed -Continue aspirin and statin -Increased nursing assistance   We will consult palliative care for goals of care conversation.  Overall poor prognosis.  I had discussion with patient's niece and wife at bedside.  Time spent: 35 minutes

## 2019-09-22 NOTE — Consult Note (Signed)
Consultation Note Date: 09/22/2019   Patient Name: Jerry Barrett  DOB: November 25, 1932  MRN: [id number]  Age / Sex: 86 y.o., male  [facility] Referring Physician: Max Sane, MD  Reason for Consultation: Establishing goals of care  HPI/Patient Profile: 86 y.o. male  with past medical history of recent CVA (09/02/19) with residual L hemiparesis and aphasia, DM, HTN, and hypothyroidism admitted on 09/22/2019 with creatinine > 6. Concern AKI is d/t poor PO intake. PMT consulted for [place].  Clinical Assessment and Goals of Care: I have reviewed medical records including EPIC notes, labs and imaging, received report from RN, assessed the patient and then met with wife, Annalee Genta, to discuss diagnosis prognosis, GOC, EOL wishes, disposition and options.  RN shares patient has had no PO intake and has slept most of the day.  I introduced Palliative Medicine as specialized medical care for people living with serious illness. It focuses on providing relief from the symptoms and stress of a serious illness. The goal is to improve quality of life for both the patient and the family.  We discussed a brief life review of the patient. Wife tells me they have been married 54 years. He worked as a Health visitor. They have a niece that is supportive and multiple friends and neighbors that also provide support.  As far as functional and nutritional status, wife tells me prior to stroke patient was walking, driving, and able to take care of himself. She is not sure his new baseline since his stroke since he has been at rehab since then. She does tell me that she was told one day prior to this admission that the patient was doing great with rehab - up walking with talker. She does tell me the patient had a fall prior to his stroke. She had no concerns about his appetite prior to stroke. We discuss his cognition - some note of dementia in his chart - wife  tells me she was never told this and is unsure if he has dementia.   We discussed patient's current illness and what it means in the larger context of patient's on-going co-morbidities.  Natural disease trajectory and expectations at EOL were discussed. We discussed patient's medical condition in depth and concern about how he will do moving forward. Discussed kidney failure and poor PO intake. Discussed effects of stroke on his body. Wife seems to understand conversation but I am concerned she may have some memory problems.   I attempted to elicit values and goals of care important to the patient. Patient has a completed MOST form - discussed with wife - she does share that he would never want a feeding tube and would want DNR status. We did discuss dialysis as well - discussed what patient would want if it were recommended - she is unsure - thinks he would want it -  would want extra support making this decision if dialysis were needed.  I asked wife about her niece joining [place] for conversation to help support her and help with decision making. She is agreeable to this. Will try to arrange tomorrow.    Questions and concerns were addressed. The family was encouraged to call with questions or concerns.   Primary Decision Maker NEXT OF KIN - wife - [name]    SUMMARY OF RECOMMENDATIONS   - patient has MOST form - agreed on DNR and no feeding tube if needed - educated wife on condition - suspect some mild memory  problems with wife - will attempt to arrange meeting to include niece to support wife  Code Status/Advance Care Planning:  DNR  Prognosis:   Unable to determine  Discharge Planning: To Be Determined      Primary Diagnoses: Present on Admission: . HTN (hypertension) . Hypothyroidism   I have reviewed the medical record, interviewed the patient and family, and examined the patient. The following aspects are pertinent.  Past Medical History:  Diagnosis Date  .  Dementia ([place])   . Diabetes mellitus without complication ([place])   . Hyperglycemia   . Hypertension   . Thyroid disease    Social History   Socioeconomic History  . Marital status: Married    Spouse name: Not on file  . Number of children: Not on file  . Years of education: Not on file  . Highest education level: Not on file  Occupational History  . Not on file  Tobacco Use  . Smoking status: Never Smoker  . Smokeless tobacco: Never Used  Substance and Sexual Activity  . Alcohol use: No  . Drug use: Never  . Sexual activity: Not on file  Other Topics Concern  . Not on file  Social History Narrative  . Not on file   Social Determinants of Health   Financial Resource Strain:   . Difficulty of Paying Living Expenses:   Food Insecurity:   . Worried About Charity fundraiser in the Last Year:   . Arboriculturist in the Last Year:   Transportation Needs:   . Film/video editor (Medical):   Marland Kitchen Lack of Transportation (Non-Medical):   Physical Activity:   . Days of Exercise per Week:   . Minutes of Exercise per Session:   Stress:   . Feeling of Stress :   Social Connections:   . Frequency of Communication with Friends and Family:   . Frequency of Social Gatherings with Friends and Family:   . Attends Religious Services:   . Active Member of Clubs or Organizations:   . Attends Archivist Meetings:   Marland Kitchen Marital Status:    History reviewed. No pertinent family history. Scheduled Meds: . [START ON 09/23/2019] heparin injection (subcutaneous)  5,000 Units Subcutaneous Q8H  . insulin aspart  0-15 Units Subcutaneous TID WC   Continuous Infusions: . sodium chloride 100 mL/hr at 09/22/19 0510   PRN Meds:.acetaminophen **OR** acetaminophen, ondansetron **OR** ondansetron (ZOFRAN) IV No Known Allergies Review of Systems  Unable to perform ROS: Patient nonverbal    Physical Exam  Constitutional: No distress.  Pulmonary/Chest: Effort normal. No respiratory  distress.  Neurological: He is alert.  nonverbal  Skin: Skin is warm and dry.    Vital Signs: BP 113/68 (BP Location: Left Arm)   Pulse 73   Temp (!) 97.5 F (36.4 C) (Oral)   Resp 18   Ht 6' (1.829 m)   Wt 83.9 kg   SpO2 99%   BMI 25.09 kg/m  Pain Scale: PAINAD POSS *See Group Information*: 1-Acceptable,Awake and alert Pain Score: 0-No pain   SpO2: SpO2: 99 % O2 Device:SpO2: 99 % O2 Flow Rate: .   IO: Intake/output summary:   Intake/Output Summary (Last 24 hours) at 09/22/2019 1554 Last data filed at 09/22/2019 1350 Gross per 24 hour  Intake 1290 ml  Output 150 ml  Net 1140 ml    LBM:   Baseline Weight: Weight: 83.9 kg Most recent weight: Weight: 83.9 kg     Palliative Assessment/Data: PPS  40%    Time Total: 60 minutes Greater than 50%  of this time was spent counseling and coordinating care related to the above assessment and plan.  Juel Burrow, DNP, AGNP-C Palliative Medicine Team [phone] Pager: [phone]

## 2019-09-22 NOTE — Progress Notes (Signed)
PHARMACIST - PHYSICIAN COMMUNICATION  CONCERNING:  Enoxaparin (Lovenox) for DVT Prophylaxis    RECOMMENDATION: Patient was prescribed enoxaprin 40mg  q24 hours for VTE prophylaxis.   Filed Weights   09/22/19 0026  Weight: 83.9 kg (185 lb)    Body mass index is 25.09 kg/m.  Estimated Creatinine Clearance: 9.1 mL/min (A) (by C-G formula based on SCr of 6.52 mg/dL (H)).  Patient is candidate for enoxaparin 30mg  every 24 hours based on CrCl <79ml/min or Weight less then 45kg for women or <57kg for men   DESCRIPTION: Pharmacy has adjusted enoxaparin dose per [facility] policy.  Patient is now receiving enoxaparin 30mg  every 24 hours.  31m, PharmD Clinical Pharmacist  09/22/2019 5:05 AM

## 2019-09-22 NOTE — ED Provider Notes (Signed)
[facility] Emergency Department Provider Note  ____________________________________________   First MD Initiated Contact with Patient 09/22/19 0022     (approximate)  I have reviewed the triage vital signs and the nursing notes.   HISTORY  Chief Complaint Abnormal Lab  Level 5 caveat:  history/ROS limited by chronic dementia  HPI Jerry Barrett is a 86 y.o. male with medical history as listed below most notably with dementia living at [facility].  He presents for evaluation of abnormal labs at the facility.  Reportedly his creatinine was elevated consistent with acute renal failure and he has no history of renal disease.  He is essentially nonverbal but is responding to simple commands, appropriately nodding and shaking his head yes and no.  He is in no distress.  He arrives with a MOST form which indicates the following:   DNR, comfort measures, "determine use or limitation of antibiotics when infection occurs", "IV fluids for a defined trial period", and no feeding tube.  The form is signed by his spouse, Daltyn Degroat.  He arrives also with paperwork showing lab results most notable for a creatinine of 6.97 and a BUN of 98.  His potassium was 5.8.  Anion gap was just slightly above normal at 15, otherwise his metabolic panel was within normal limits.    Past Medical History:  Diagnosis Date  . Dementia ([place])   . Diabetes mellitus without complication ([employer])   . Hyperglycemia   . Hypertension   . Thyroid disease     Patient Active Problem List   Diagnosis Date Noted  . AKI (acute kidney injury) ([place]) 09/22/2019  . History of CVA with residual deficit 09/22/2019  . Hemiparesis and other late effects of cerebrovascular accident ([place]) 09/22/2019  . Hyperkalemia 09/22/2019  . SIRS (systemic inflammatory response syndrome) ([place]) 07/19/2017  . Influenza A 07/19/2017  . Diabetes ([place]) 07/19/2017  . HTN (hypertension) 07/19/2017  . Hypothyroidism  07/19/2017  . HLD (hyperlipidemia) 07/19/2017    Past Surgical History:  Procedure Laterality Date  . EYE SURGERY      Prior to Admission medications   Medication Sig Start Date End Date Taking? Authorizing Provider  acetaminophen (TYLENOL) 325 MG tablet Take 650 mg by mouth every 6 (six) hours as needed.   Yes [provider]  aspirin EC 81 MG tablet Take 81 mg by mouth daily.   Yes [provider]  atorvastatin (LIPITOR) 20 MG tablet Take 20 mg by mouth daily.   Yes [provider]  bisacodyl (DULCOLAX) 10 MG suppository Place 10 mg rectally as needed for moderate constipation.   Yes [provider]  enoxaparin (LOVENOX) 40 MG/0.4ML injection Inject 40 mg into the skin daily.   Yes [provider]  Glucagon HCl 1 MG SOLR Inject 1 mL as directed as needed.   Yes [provider]  insulin lispro (HUMALOG KWIKPEN) 100 UNIT/ML KwikPen Inject 0-4 Units into the skin 3 (three) times daily. Per sliding scale 61-200= 0 units 201-250=2 units 251-300=3 units 301-350=4 units   Yes [provider]  levothyroxine (SYNTHROID, LEVOTHROID) 50 MCG tablet Take 50 mcg by mouth daily before breakfast.   Yes [provider]  lisinopril (ZESTRIL) 10 MG tablet Take 10 mg by mouth daily.    Yes [provider]  melatonin 3 MG TABS tablet Take 3 mg by mouth at bedtime.   Yes [provider]  metFORMIN (GLUCOPHAGE) 500 MG tablet Take 1.5 tablets (750 mg total) by  mouth 2 (two) times daily with a meal. Patient taking differently: Take 1,000 mg by mouth 2 (two) times daily with a meal.  07/21/17  Yes Altamese DillingVachhani, Vaibhavkumar, MD  ondansetron (ZOFRAN) 4 MG tablet Take 4 mg by mouth every 8 (eight) hours as needed for nausea or vomiting.   Yes [provider]  sennosides-docusate sodium (SENOKOT-S) 8.6-50 MG tablet Take 1 tablet by mouth 2 (two) times daily.   Yes [provider]  doxycycline (VIBRAMYCIN) 100 MG  capsule Take 1 capsule (100 mg total) by mouth 2 (two) times daily. Patient not taking: Reported on 09/22/2019 05/29/18   Tommie Samsook, Jayce G, DO  lovastatin (MEVACOR) 40 MG tablet Take 80 mg by mouth at bedtime.    [provider]  sildenafil (VIAGRA) 100 MG tablet Take 100 mg by mouth daily as needed for erectile dysfunction.    [provider]    Allergies Patient has no known allergies.  History reviewed. No pertinent family history.  Social History Social History   Tobacco Use  . Smoking status: Never Smoker  . Smokeless tobacco: Never Used  Substance Use Topics  . Alcohol use: No  . Drug use: Never    Review of Systems Level 5 caveat:  history/ROS limited by chronic dementia ____________________________________________   PHYSICAL EXAM:  ED Triage Vitals  Enc Vitals Group     BP 09/22/19 0027 (!) 120/54     Pulse Rate 09/22/19 0027 76     Resp 09/22/19 0027 13     Temp 09/22/19 0027 97.9 F (36.6 C)     Temp Source 09/22/19 0027 Oral     SpO2 09/22/19 0027 98 %     Weight 09/22/19 0026 83.9 kg (185 lb)     Height 09/22/19 0026 1.829 m (6')     Head Circumference --      [facility] --      Pain Score 09/22/19 0025 0     Pain Loc --      Pain Edu? --      Excl. in GC? --      Constitutional: Awake and alert.  Nods and shakes his head appropriately to yes/no questions but is nonverbal at this time.  Does not appear to be in any distress.  He denies pain and denies shortness of breath. Eyes: Conjunctivae are normal.  Head: Atraumatic. Nose: No congestion/rhinnorhea. Mouth/Throat: Patient is wearing a mask.  Mucous membranes are dry. Neck: No stridor.  No meningeal signs.   Cardiovascular: Normal rate, regular rhythm. Good peripheral circulation. Grossly normal heart sounds. Respiratory: Normal respiratory effort.  No retractions. Gastrointestinal: Soft and nontender. No distention.  Musculoskeletal: No lower extremity tenderness nor edema. No gross  deformities of extremities. Neurologic:  No gross focal neurologic deficits are appreciated.  He is moving all 4 extremities but is not speaking. Skin:  Skin is warm, dry and intact.   ____________________________________________   LABS (all labs ordered are listed, but only abnormal results are displayed)  Labs Reviewed  CBC WITH DIFFERENTIAL/PLATELET - Abnormal; Notable for the following components:      Result Value   RBC 4.13 (*)    All other components within normal limits  COMPREHENSIVE METABOLIC PANEL - Abnormal; Notable for the following components:   Potassium 6.1 (*)    CO2 20 (*)    Glucose, Bld 137 (*)    BUN 109 (*)    Creatinine, Ser 6.52 (*)    GFR calc non Af Amer 7 (*)  GFR calc Af Amer 8 (*)    All other components within normal limits  MAGNESIUM - Abnormal; Notable for the following components:   Magnesium 2.8 (*)    All other components within normal limits  PHOSPHORUS - Abnormal; Notable for the following components:   Phosphorus 6.5 (*)    All other components within normal limits  URINALYSIS, COMPLETE (UACMP) WITH MICROSCOPIC - Abnormal; Notable for the following components:   Color, Urine YELLOW (*)    APPearance CLEAR (*)    Protein, ur 30 (*)    All other components within normal limits  BASIC METABOLIC PANEL - Abnormal; Notable for the following components:   Chloride 112 (*)    CO2 19 (*)    Glucose, Bld 123 (*)    BUN 102 (*)    Creatinine, Ser 6.12 (*)    GFR calc non Af Amer 8 (*)    GFR calc Af Amer 9 (*)    All other components within normal limits  CBC - Abnormal; Notable for the following components:   RBC 3.70 (*)    Hemoglobin 12.3 (*)    HCT 36.2 (*)    All other components within normal limits  SARS CORONAVIRUS 2 BY RT PCR (DIASORIN)  URINE CULTURE  LIPASE, BLOOD  T4, FREE  TSH  HEMOGLOBIN A1C   ____________________________________________  EKG  ED ECG REPORT I, Loleta Rose, the attending physician, personally  viewed and interpreted this ECG.  Date: 09/22/2019 EKG Time: 00: 25 Rate: 00: 25 Rhythm: Sinus rhythm with first-degree AV block QRS Axis: normal Intervals: PR interval is 223 ms.  Right bundle branch block.  Otherwise unremarkable. ST/T Wave abnormalities: Non-specific ST segment / T-wave changes, but no clear evidence of acute ischemia. Narrative Interpretation: no definitive evidence of acute ischemia; does not meet STEMI criteria.   ____________________________________________  RADIOLOGY I, Loleta Rose, personally viewed and evaluated these images (plain radiographs) as part of my medical decision making, as well as reviewing the written report by the radiologist.  ED MD interpretation:  No acute abnormalities on head nor abdominal CTs  Official radiology report(s): CT HEAD WO CONTRAST  Result Date: 09/22/2019 CLINICAL DATA:  Dementia, nonverbal, previous stroke EXAM: CT HEAD WITHOUT CONTRAST TECHNIQUE: Contiguous axial images were obtained from the base of the skull through the vertex without intravenous contrast. COMPARISON:  None. FINDINGS: Brain: No acute infarct or hemorrhage. Lateral ventricles and midline structures are unremarkable. No acute extra-axial fluid collections. No mass effect. Vascular: No hyperdense vessel or unexpected calcification. Skull: Normal. Negative for fracture or focal lesion. Sinuses/Orbits: The paranasal sinuses are clear. Small amount of gas in the right cavernous sinus is likely an incidental finding and related to IV placement. Other: None. IMPRESSION: 1. No acute intracranial process. Electronically Signed   By: Sharlet Salina M.D.   On: 09/22/2019 03:07   CT Renal Stone Study  Result Date: 09/22/2019 CLINICAL DATA:  Acute renal failure, dementia EXAM: CT ABDOMEN AND PELVIS WITHOUT CONTRAST TECHNIQUE: Multidetector CT imaging of the abdomen and pelvis was performed following the standard protocol without IV contrast. COMPARISON:  None. FINDINGS:  Lower chest: No acute pleural or parenchymal lung disease. Extensive coronary artery atherosclerosis. Hepatobiliary: No focal liver abnormality is seen. No gallstones, gallbladder wall thickening, or biliary dilatation. Pancreas: Unremarkable. No pancreatic ductal dilatation or surrounding inflammatory changes. Spleen: Normal in size without focal abnormality. Adrenals/Urinary Tract: No urinary tract calculi or obstructive uropathy. The bladder is only minimally distended, which limits its evaluation.  The adrenals are normal. Stomach/Bowel: No bowel obstruction or ileus. Moderate retained stool within the colon. No bowel wall thickening or inflammatory change. Vascular/Lymphatic: Aortic atherosclerosis. No enlarged abdominal or pelvic lymph nodes. Reproductive: Prostate is unremarkable. Other: No free fluid or free gas. No evidence of hernia. Likely sebaceous cyst just superior to the base of the penis within the midline lower anterior abdominal wall. Musculoskeletal: No acute or destructive bony lesions. Reconstructed images demonstrate no additional findings. IMPRESSION: 1. No urinary tract calculi or obstructive uropathy. 2. No acute intra-abdominal or intrapelvic process. 3. Atherosclerosis. Electronically Signed   By: Sharlet Salina M.D.   On: 09/22/2019 03:00    ____________________________________________   PROCEDURES   Procedure(s) performed (including Critical Care):  .Critical Care Performed by: Loleta Rose, MD Authorized by: Loleta Rose, MD   Critical care provider statement:    Critical care time (minutes):  30   Critical care time was exclusive of:  Separately billable procedures and treating other patients   Critical care was necessary to treat or prevent imminent or life-threatening deterioration of the following conditions:  Dehydration and metabolic crisis (hyperkalemia)   Critical care was time spent personally by me on the following activities:  Development of treatment plan  with patient or surrogate, discussions with consultants, evaluation of patient's response to treatment, examination of patient, obtaining history from patient or surrogate, ordering and performing treatments and interventions, ordering and review of laboratory studies, ordering and review of radiographic studies, pulse oximetry, re-evaluation of patient's condition and review of old charts     ____________________________________________   INITIAL IMPRESSION / MDM / ASSESSMENT AND PLAN / ED COURSE  As part of my medical decision making, I reviewed the following data within the electronic MEDICAL RECORD NUMBER Nursing notes reviewed and incorporated, Labs reviewed , EKG interpreted , Old chart reviewed, Discussed with admitting physician (Dr. Para March) and Notes from prior ED visits   Differential diagnosis includes, but is not limited to, renal failure due to volume depletion, bladder outlet obstruction leading to renal failure, nonspecific metabolic or electrolyte abnormalities, acute infection.  He has no infectious signs or symptoms and has completely stable vital signs at this time.  His initial assessment is reassuring but he does appear dry.  I will start 1 L of normal saline IV bolus and await the results of the rest of the imaging.  As per his MOST form IV fluids are indicated and appropriate.  The patient is on the cardiac monitor to evaluate for evidence of arrhythmia and/or significant heart rate changes, particularly given the report of hyperkalemia.       Clinical Course as of Sep 21 713  Thu Sep 22, 2019  0118 No evidence of UTI  Urinalysis, Complete w Microscopic(!) [CF]  0118 No abnormalities on CBC  CBC with Differential/Platelet(!) [CF]  0200 Lab work confirms renal failure with a creatinine of 6.5 which is new as of just about a week.  Etiology is unclear but given his hypomagnesemia, hyperphosphatemia, and hyperkalemia I suspect that this is mostly volume related.  I have  ordered another liter of normal saline IV bolus.  Given his potassium I have also ordered Lokelma 10 g by mouth, calcium gluconate 1 g IV given some nonspecific EKG changes although generally the EKG is reassuring, and albuterol 5 mg nebulizer treatment to drive the potassium intracellular.  However I think the volume repletion will be the most important issue in bringing down the potassium level.  I have consulted the  hospitalist for admission.   [CF]  0220 Discussed case with Dr. Para March the hospitalist by phone.  She will admit.  She asked me to obtain a CT renal stone protocol to rule out any evidence of ureteral obstruction which I have done.  Of note, when his nurse in and out catheterized him, he had only about 50 mL in the bladder, but I agree with the plan to scan to look for any evidence of even chronic bladder outlet obstruction.   [CF]  0303 No acute intraabdominal process on renal stone protocol CT abd/pelvis  CT Renal [place] [CF]    Clinical Course User Index [CF] Loleta Rose, MD     ____________________________________________  FINAL CLINICAL IMPRESSION(S) / ED DIAGNOSES  Final diagnoses:  Acute renal failure, unspecified acute renal failure type (HCC)  Volume depletion  Hyperkalemia  Hypermagnesemia  Hyperphosphatemia  Dementia without behavioral disturbance, unspecified dementia type (HCC)  DNR (do not resuscitate)     MEDICATIONS GIVEN DURING THIS VISIT:  Medications  enoxaparin (LOVENOX) injection 30 mg (has no administration in time range)  0.9 %  sodium chloride infusion ( Intravenous New Bag/Given 09/22/19 0510)  acetaminophen (TYLENOL) tablet 650 mg (has no administration in time range)    Or  acetaminophen (TYLENOL) suppository 650 mg (has no administration in time range)  ondansetron (ZOFRAN) tablet 4 mg (has no administration in time range)    Or  ondansetron (ZOFRAN) injection 4 mg (has no administration in time range)  insulin aspart (novoLOG)  injection 0-15 Units (has no administration in time range)  sodium chloride 0.9 % bolus 1,000 mL (0 mLs Intravenous Stopped 09/22/19 0216)  sodium zirconium cyclosilicate (LOKELMA) packet 10 g (10 g Oral Given 09/22/19 0322)  sodium chloride 0.9 % bolus 1,000 mL (1,000 mLs Intravenous New Bag/Given 09/22/19 0214)  albuterol (PROVENTIL) (2.5 MG/3ML) 0.083% nebulizer solution 5 mg (5 mg Nebulization Given 09/22/19 0217)  calcium gluconate 1 g/ 50 mL sodium chloride IVPB (0 g Intravenous Stopped 09/22/19 0355)     ED Discharge Orders    None      *Please note:  Jerry Barrett was evaluated in Emergency Department on 09/22/2019 for the symptoms described in the history of present illness. He was evaluated in the context of the global COVID-19 pandemic, which necessitated consideration that the patient might be at risk for infection with the SARS-CoV-2 virus that causes COVID-19. Institutional protocols and algorithms that pertain to the evaluation of patients at risk for COVID-19 are in a state of rapid change based on information released by regulatory bodies including the CDC and federal and state organizations. These policies and algorithms were followed during the patient's care in the ED.  Some ED evaluations and interventions may be delayed as a result of limited staffing during the pandemic.*  Note:  This document was prepared using Dragon voice recognition software and may include unintentional dictation errors.   Loleta Rose, MD 09/22/19 [phone]

## 2019-09-23 DIAGNOSIS — Z515 Encounter for palliative care: Secondary | ICD-10-CM

## 2019-09-23 DIAGNOSIS — Z7189 Other specified counseling: Secondary | ICD-10-CM

## 2019-09-23 DIAGNOSIS — F0390 Unspecified dementia without behavioral disturbance: Secondary | ICD-10-CM

## 2019-09-23 LAB — CBC
HCT: 36.9 % — ABNORMAL LOW (ref 39.0–52.0)
Hemoglobin: 12.9 g/dL — ABNORMAL LOW (ref 13.0–17.0)
MCH: 33.4 pg (ref 26.0–34.0)
MCHC: 35 g/dL (ref 30.0–36.0)
MCV: 95.6 fL (ref 80.0–100.0)
Platelets: 164 10*3/uL (ref 150–400)
RBC: 3.86 MIL/uL — ABNORMAL LOW (ref 4.22–5.81)
RDW: 13 % (ref 11.5–15.5)
WBC: 9.5 10*3/uL (ref 4.0–10.5)
nRBC: 0 % (ref 0.0–0.2)

## 2019-09-23 LAB — GLUCOSE, CAPILLARY
Glucose-Capillary: 84 mg/dL (ref 70–99)
Glucose-Capillary: 130 mg/dL — ABNORMAL HIGH (ref 70–99)
Glucose-Capillary: 148 mg/dL — ABNORMAL HIGH (ref 70–99)
Glucose-Capillary: 62 mg/dL — ABNORMAL LOW (ref 70–99)
Glucose-Capillary: 91 mg/dL (ref 70–99)

## 2019-09-23 LAB — BASIC METABOLIC PANEL
Anion gap: 13 (ref 5–15)
BUN: 94 mg/dL — ABNORMAL HIGH (ref 8–23)
CO2: 17 mmol/L — ABNORMAL LOW (ref 22–32)
Calcium: 8.7 mg/dL — ABNORMAL LOW (ref 8.9–10.3)
Chloride: 113 mmol/L — ABNORMAL HIGH (ref 98–111)
Creatinine, Ser: 5.36 mg/dL — ABNORMAL HIGH (ref 0.61–1.24)
GFR calc Af Amer: 10 mL/min — ABNORMAL LOW (ref >60)
GFR calc non Af Amer: 9 mL/min — ABNORMAL LOW (ref >60)
Glucose, Bld: 99 mg/dL (ref 70–99)
Potassium: 5.3 mmol/L — ABNORMAL HIGH (ref 3.5–5.1)
Sodium: 143 mmol/L (ref 135–145)

## 2019-09-23 MED ORDER — DOCUSATE SODIUM 50 MG/5ML PO LIQD
50.0000 mg | Freq: Two times a day (BID) | ORAL | Status: DC
  Administered 2019-09-23 (×2): 50 mg via ORAL
  Filled 2019-09-23 (×5): qty 10

## 2019-09-23 MED ORDER — SODIUM CHLORIDE 0.9 % IV BOLUS
500.0000 mL | Freq: Once | INTRAVENOUS | Status: AC
  Administered 2019-09-23: 500 mL via INTRAVENOUS

## 2019-09-23 MED ORDER — SENNOSIDES 8.8 MG/5ML PO SYRP
5.0000 mL | ORAL_SOLUTION | Freq: Two times a day (BID) | ORAL | Status: DC
  Administered 2019-09-23 (×2): 5 mL via ORAL
  Filled 2019-09-23 (×5): qty 5

## 2019-09-23 NOTE — TOC Progression Note (Signed)
Transition of Care (TOC) - Progression Note    Patient Details  Name: Jerry Barrett MRN: [id number] Date of Birth: 01/08/1933  Transition of Care (TOC) CM/SW Contact   J , RN Phone Number: 09/23/2019, 9:45 AM  Clinical Narrative:    Met with the family, (the spouse and the niece), The patient has been been at [facility] for a week, they want him to go home after a short term rehab today.  They want him to go back to [facility].  Palliative is following the patient        Expected Discharge Plan and Services                                                 Social Determinants of Health (SDOH) Interventions    Readmission Risk Interventions No flowsheet data found.  

## 2019-09-23 NOTE — Evaluation (Signed)
Clinical/Bedside Swallow Evaluation Patient Details  Name: Jerry Barrett MRN: [id number] Date of Birth: Dec 17, 1932  Today's Date: 09/23/2019 Time: SLP Start Time (ACUTE ONLY): 0930 SLP Stop Time (ACUTE ONLY): 1015 SLP Time Calculation (min) (ACUTE ONLY): 45 min  Past Medical History:  Past Medical History:  Diagnosis Date  . Dementia (HCC)   . Diabetes mellitus without complication (HCC)   . Hyperglycemia   . Hypertension   . Thyroid disease    Past Surgical History:  Past Surgical History:  Procedure Laterality Date  . EYE SURGERY     HPI:  HPI 09/22/2019: Jerry Barrett is a 86 y.o. male with medical history significant for recent CVA 09/02/19 received TPA complicated by small intraparenchymal bleed, with no CVA on MRI but with residual left hemiparesis and aphasia currently at rehab, who also has history of diabetes, hypertension, hypothyroidism who was sent to the emergency room for evaluation after routine blood work returned with elevated creatinine above 6.  On April 3 creatinine was 1.2, on April 8 it was 2.1.  Most of the history is compiled by ER records as patient is partially verbal.  ED Course: In the emergency room vitals were mostly unremarkable except for soft blood pressure.  BP on arrival was 120/54 subsequently 104/50.  Blood work significant for creatinine of 6.52 with hyperkalemia of 6.1 only mild metabolic acidosis of 20.  Urinalysis unremarkable.  He had elevated magnesium of 2.8 and phosphorus of 6.5.  Urinary catheterization in the ER returned only 50 mils urine.    Patient received Lokelma and calcium gluconate in the emergency room. CT abdomen and pelvis and CT head requested at time of admission.  Patient was discharged for [facility] rehab 09/09/2019 on a regular diet.   Assessment / Plan / Recommendation Clinical Impression  The patient is not presenting with clinical indicators of aspiration.  He is able to pull thin liquids via straw with no cough, throat clear, or  vocal quality change.  He is able to take puree from spoon and swallow with no clinical indicators of aspiration or oral residue.  with moist chewable textures, the patient demonstrates timely oral transit times with minimal oral residue.  This clears with a sip of liquid.  The patient did not swallow a bite of dry scrambled eggs.  He continued to masticate without swallowing.  He was given a bite of applesauce which was helpful in oral clearance.  Recommend dysphagia 2 diet with thin liquids.  Will follow for diet tolerance and assess for diet upgrade.   SLP Visit Diagnosis: Dysphagia, oral phase (R13.11)    Aspiration Risk  No limitations;Other (comment)(With modified foods)    Diet Recommendation Dysphagia 2 (Fine chop);Thin liquid   Liquid Administration via: Straw Medication Administration: Whole meds with puree Supervision: Staff to assist with self feeding Compensations: Minimize environmental distractions;Slow rate;Small sips/bites;Lingual sweep for clearance of pocketing;Follow solids with liquid Postural Changes: Seated upright at 90 degrees;Remain upright for at least 30 minutes after po intake    Other  Recommendations Oral Care Recommendations: Oral care BID;Staff/trained caregiver to provide oral care   Follow up Recommendations Skilled Nursing facility      Frequency and Duration min 3x week  2 weeks       Prognosis Prognosis for Safe Diet Advancement: Fair Barriers to Reach Goals: Cognitive deficits      Swallow Study   General Date of Onset: 09/22/19 HPI: HPI 09/22/2019: Jerry Barrett is a 86 y.o. male with medical  history significant for recent CVA 09/02/19 received TPA complicated by small intraparenchymal bleed, with no CVA on MRI but with residual left hemiparesis and aphasia currently at rehab, who also has history of diabetes, hypertension, hypothyroidism who was sent to the emergency room for evaluation after routine blood work returned with elevated creatinine  above 6.  On April 3 creatinine was 1.2, on April 8 it was 2.1.  Most of the history is compiled by ER records as patient is partially verbal.  ED Course: In the emergency room vitals were mostly unremarkable except for soft blood pressure.  BP on arrival was 120/54 subsequently 104/50.  Blood work significant for creatinine of 6.52 with hyperkalemia of 6.1 only mild metabolic acidosis of 20.  Urinalysis unremarkable.  He had elevated magnesium of 2.8 and phosphorus of 6.5.  Urinary catheterization in the ER returned only 50 mils urine.    Patient received Lokelma and calcium gluconate in the emergency room. CT abdomen and pelvis and CT head requested at time of admission.  Patient was discharged for [place] rehab 09/09/2019 on a regular diet. Type of Study: Bedside Swallow Evaluation Previous Swallow Assessment: While in rehab Diet Prior to this Study: Regular;Thin liquids Behavior/Cognition: Alert;Cooperative;Pleasant mood;Other (Comment)(Quiet) Oral Cavity Assessment: Dry Oral Cavity - Dentition: Dentures, top;Dentures, bottom Self-Feeding Abilities: Other (Comment)(Wearing mitts) Patient Positioning: Upright in bed Baseline Vocal Quality: Normal Volitional Cough: Cognitively unable to elicit Volitional Swallow: Unable to elicit    Oral/Motor/Sensory Function Overall Oral Motor/Sensory Function: Generalized oral weakness   Ice Chips     Thin Liquid Thin Liquid: Within functional limits Presentation: Straw    Nectar Thick     Honey Thick     Puree Puree: Within functional limits Presentation: Spoon   Solid     Solid: Impaired Presentation: Spoon Oral Phase Impairments: Other (comment)(Chews without swallowing dry, mild oral residue with soft/mo) Oral Phase Functional Implications: Oral residue     [name], MS/CCC- SLP  Valetta Fuller, Daine [place] 09/23/2019,11:48 AM

## 2019-09-23 NOTE — Progress Notes (Signed)
[address] [place], [place] [address] Phone [phone]. Fax [phone]  Date: 09/23/2019                  Patient Name:  Jerry Barrett  MRN: [id number]  DOB: [birth year]  Age / Sex: 86 y.o., male         PCP: [facility]                 Service Requesting Consult:  Internal medicine/ Delfino Lovett, MD                 Reason for Consult:  Acute renal failure            History of Present Illness: Patient is a 86 y.o. male with medical problems of recent stroke, who was admitted to [facility] on 09/22/2019 for evaluation of acute kidney injury.  Patient presented to the ED via EMS from [facility] for abnormal renal labs.   They report that patient had stroke and was hospitalized at [facility].  He was then sent to rehab and then discharged to [facility] nursing home  Patient's wife and family member report that he ate a few bites of egg with tea and water.  Yesterday he had very poor oral intake.  He takes long time to chew his food.  They feel his mouth is dry today.  They also feel he has voided more often.  Urine output appears to be increased.    Current medications: Current Facility-Administered Medications  Medication Dose Route Frequency Provider Last Rate Last Admin  . 0.9 %  sodium chloride infusion   Intravenous Continuous Andris Baumann, MD 100 mL/hr at 09/23/19 0921 New Bag at 09/23/19 0921  . acetaminophen (TYLENOL) tablet 650 mg  650 mg Oral Q6H PRN Andris Baumann, MD       Or  . acetaminophen (TYLENOL) suppository 650 mg  650 mg Rectal Q6H PRN Andris Baumann, MD      . heparin injection 5,000 Units  5,000 Units Subcutaneous Q8H Delfino Lovett, MD   5,000 Units at 09/23/19 [phone]  . insulin aspart (novoLOG) injection 0-15 Units  0-15 Units Subcutaneous TID WC Andris Baumann, MD      . ondansetron [facility]) tablet 4 mg  4 mg Oral Q6H PRN Andris Baumann, MD       Or  . ondansetron [facility]) injection 4 mg  4 mg Intravenous Q6H PRN Andris Baumann, MD          Allergies: No Known Allergies   Vital Signs: Blood pressure 101/81, pulse 77, temperature (!) 97.4 F (36.3 C), temperature source Oral, resp. rate 20, height 6' (1.829 m), weight 83.9 kg, SpO2 95 %.   Intake/Output Summary (Last 24 hours) at 09/23/2019 1153 Last data filed at 09/23/2019 0400 Gross per 24 hour  Intake 2249.05 ml  Output 100 ml  Net 2149.05 ml    Weight trends: [employer]   09/22/19 0026  Weight: 83.9 kg    Physical Exam: General:  Chronically ill-appearing, laying in the bed  HEENT  anicteric, moist oral mucous membrane  Neck:  Supple, no thyromegaly  Lungs:  Normal breathing effort, clear to auscultation  Heart::  No rub  Abdomen:  Soft, nontender, fullness at lower abdomen  Extremities:  No peripheral edema  Neurologic:  Alert, able to follow few simple commands  Skin:  No acute rashes    Lab results: Basic Metabolic Panel: Recent  Labs  Lab 09/22/19 0039 09/22/19 0540 09/23/19 0444  NA 139 142 143  K 6.1* 4.7 5.3*  CL 105 112* 113*  CO2 20* 19* 17*  GLUCOSE 137* 123* 99  BUN 109* 102* 94*  CREATININE 6.52* 6.12* 5.36*  CALCIUM 9.4 8.9 8.7*  MG 2.8*  --   --   PHOS 6.5*  --   --     Liver Function Tests: Recent Labs  Lab 09/22/19 0039  AST 17  ALT 17  ALKPHOS 69  BILITOT 1.2  PROT 7.9  ALBUMIN 4.5   Recent Labs  Lab 09/22/19 0039  LIPASE 43   No results for input(s): AMMONIA in the last 168 hours.  CBC: Recent Labs  Lab 09/22/19 0039 09/22/19 0039 09/22/19 0540 09/23/19 0444  WBC 8.6   < > 8.2 9.5  NEUTROABS 6.0  --   --   --   HGB 13.7   < > 12.3* 12.9*  HCT 39.5   < > 36.2* 36.9*  MCV 95.6   < > 97.8 95.6  PLT 215   < > 173 164   < > = values in this interval not displayed.    Cardiac Enzymes: No results for input(s): CKTOTAL, TROPONINI in the last 168 hours.  BNP: Invalid input(s): POCBNP  CBG: Recent Labs  Lab 09/22/19 0829 09/22/19 1206 09/22/19 1633 09/22/19 2142  09/23/19 0827  GLUCAP 107* 97 92 83 84    Microbiology: Recent Results (from the past 720 hour(s))  Urine Culture     Status: None   Collection Time: 09/22/19 12:39 AM   Specimen: Urine, Clean Catch  Result Value Ref Range Status   Specimen Description   Final    URINE, CLEAN CATCH Performed at [facility], [address], [place], [place] [address]    Special Requests   Final    Normal Performed at [facility], [address] [place], [place] [address]    Culture   Final    NO GROWTH Performed at [facility], [address] [place], [place] [address]    Report Status 09/22/2019 FINAL  Final  SARS Coronavirus 2 by RT PCR     Status: None   Collection Time: 09/22/19 12:39 AM  Result Value Ref Range Status   SARS Coronavirus 2 NEGATIVE NEGATIVE Final    Comment: (NOTE) Result indicates the ABSENCE of SARS-CoV-2 RNA in the patient specimen.  The lowest concentration of SARS-CoV-2 viral copies this assay can detect in nasopharyngeal swab specimens is 500 copies / mL.  A negative result does not preclude SARS-CoV-2 infection and should not be used as the sole basis for patient management decisions. A negative result may occur with improper specimen collection / handling, submission of a specimen other than nasopharyngeal swab, presence of viral mutation(s) within the areas targeted by this assay, and inadequate number of viral copies (<500 copies / mL) present.  Negative results must be combined with clinical observations, patient history, and epidemiological information.  The expected result is NEGATIVE.  Patient Fact Sheet:  https://wong-henderson.biz/   Provider Fact Sheet:  CheapJackpot.at   This test is not yet approved or cleared by the [place] FDA and  has been authorized for  detection and/or diagnosis of SARS-CoV-2 by FDA under an Emergency Use Authorization (EUA).  This EUA will remain  in effect (meaning this test can be used) for the duration of  the COVID-19 declaration under Section 564(b)(1) of the Act, 21  U.S.C. section 360bbb-3(b)(1), unless the authorization is terminated or revoked sooner Performed at [facility], [address] [place], [place] [address]      Coagulation Studies: No results for input(s): LABPROT, INR in the last 72 hours.  Urinalysis: Recent Labs    09/22/19 0039  COLORURINE YELLOW*  LABSPEC 1.019  PHURINE 5.0  GLUCOSEU NEGATIVE  HGBUR NEGATIVE  BILIRUBINUR NEGATIVE  KETONESUR NEGATIVE  PROTEINUR 30*  NITRITE NEGATIVE  LEUKOCYTESUR NEGATIVE        Imaging: CT HEAD WO CONTRAST  Result Date: 09/22/2019 CLINICAL DATA:  Dementia, nonverbal, previous stroke EXAM: CT HEAD WITHOUT CONTRAST TECHNIQUE: Contiguous axial images were obtained from the base of the skull through the vertex without intravenous contrast. COMPARISON:  None. FINDINGS: Brain: No acute infarct or hemorrhage. Lateral ventricles and midline structures are unremarkable. No acute extra-axial fluid collections. No mass effect. Vascular: No hyperdense vessel or unexpected calcification. Skull: Normal. Negative for fracture or focal lesion. Sinuses/Orbits: The paranasal sinuses are clear. Small amount of gas in the right cavernous sinus is likely an incidental finding and related to IV placement. Other: None. IMPRESSION: 1. No acute intracranial process. Electronically Signed   By: Sharlet Salina M.D.   On: 09/22/2019 03:07   US RENAL  Result Date: 09/22/2019 CLINICAL DATA:  Acute renal failure EXAM: RENAL / URINARY TRACT ULTRASOUND COMPLETE COMPARISON:  None. FINDINGS: Right Kidney: Renal measurements: 13.1 x 5.0 x 4.1 cm. = volume: 140 mL . Echogenicity within normal limits. No mass or hydronephrosis visualized. Left Kidney: Renal measurements: 12.0 x 6.1 x 4.9 cm. = volume: 189 mL. Echogenicity within normal limits. No mass or hydronephrosis visualized. Bladder:  Decompressed Other: None. IMPRESSION: Normal appearing kidneys bilaterally. Electronically Signed   By: Alcide Clever M.D.   On: 09/22/2019 16:47   CT Renal Stone Study  Result Date: 09/22/2019 CLINICAL DATA:  Acute renal failure, dementia EXAM: CT ABDOMEN AND PELVIS WITHOUT CONTRAST TECHNIQUE: Multidetector CT imaging of the abdomen and pelvis was performed following the standard protocol without IV contrast. COMPARISON:  None. FINDINGS: Lower chest: No acute pleural or parenchymal lung disease. Extensive coronary artery atherosclerosis. Hepatobiliary: No focal liver abnormality is seen. No gallstones, gallbladder wall thickening, or biliary dilatation. Pancreas: Unremarkable. No pancreatic ductal dilatation or surrounding inflammatory changes. Spleen: Normal in size without focal abnormality. Adrenals/Urinary Tract: No urinary tract calculi or obstructive uropathy. The bladder is only minimally distended, which limits its evaluation. The adrenals are normal. Stomach/Bowel: No bowel obstruction or ileus. Moderate retained stool within the colon. No bowel wall thickening or inflammatory change. Vascular/Lymphatic: Aortic atherosclerosis. No enlarged abdominal or pelvic lymph nodes. Reproductive: Prostate is unremarkable. Other: No free fluid or free gas. No evidence of hernia. Likely sebaceous cyst just superior to the base of the penis within the midline lower anterior abdominal wall. Musculoskeletal: No acute or destructive bony lesions. Reconstructed images demonstrate no additional findings. IMPRESSION: 1. No urinary tract calculi or obstructive uropathy. 2. No acute intra-abdominal or intrapelvic process. 3. Atherosclerosis. Electronically Signed   By: Sharlet Salina M.D.   On: 09/22/2019 03:00     Assessment & Plan: Pt is a 86 y.o.   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 AKI  Baseline Cr 1.2/GFR 55 from 09/10/2019 Admit cr 6.52 improved to 6.12 today Renal imaging (CT ) neg for obstruction (CT without contrast) 09/22/2019 U/a protein 30 mg/dL, neg for blood, 0-5 WBCs Patient was on lisinopril as outpatient.  ACE inhibitor in the setting of hypotension may have caused severe ATN Renal ultrasound negative for obstruction Serum creatinine improved slightly to 5.3 today Continue IV hydration and supportive care Electrolytes and volume status are acceptable.  No acute indication for dialysis at present  2.  Hyperkalemia Agree with administration of Lokelma as needed            LOS: 1 Larraine Argo 4/16/202111:53 AM    Note: This note was prepared with Dragon dictation. Any transcription errors are unintentional

## 2019-09-23 NOTE — NC FL2 (Signed)
MEDICAID FL2 LEVEL OF CARE SCREENING TOOL     IDENTIFICATION  Patient Name: Jerry Barrett Birthdate: Jun 24, 1932 Sex: male Admission Date (Current Location): 09/22/2019  [place] and [place] Number:  Chiropodist and Address:  [facility], [address] [place], [place] [address]      Provider Number: [id number]  Attending Physician Name and Address:  Delfino Lovett, MD  Relative Name and Phone Number:  Rudell Cobb spouse [phone]    Current Level of Care: Hospital Recommended Level of Care: Skilled Nursing Facility Prior Approval Number:    Date Approved/Denied:   PASRR Number: [id number]  Discharge Plan: SNF    Current Diagnoses: Patient Active Problem List   Diagnosis Date Noted  . Goals of care, counseling/discussion   . Palliative care by specialist   . AKI (acute kidney injury) (HCC) 09/22/2019  . History of CVA with residual deficit 09/22/2019  . Hemiparesis and other late effects of cerebrovascular accident (HCC) 09/22/2019  . Hyperkalemia 09/22/2019  . SIRS (systemic inflammatory response syndrome) (HCC) 07/19/2017  . Influenza A 07/19/2017  . Diabetes (HCC) 07/19/2017  . HTN (hypertension) 07/19/2017  . Hypothyroidism 07/19/2017  . HLD (hyperlipidemia) 07/19/2017    Orientation RESPIRATION BLADDER Height & Weight     (not oriented at this time)  Normal Incontinent Weight: 83.9 kg Height:  6' (182.9 cm)  BEHAVIORAL SYMPTOMS/MOOD NEUROLOGICAL BOWEL NUTRITION STATUS  Other (Comment)(picking at tubes and IV)     (not eating at this time)  AMBULATORY STATUS COMMUNICATION OF NEEDS Skin   Extensive Assist (is not communicating needs at this time) Normal                       Personal Care Assistance Level of Assistance  Dressing, Bathing Bathing Assistance: Maximum assistance   Dressing Assistance: Maximum assistance     Functional Limitations Info             SPECIAL CARE FACTORS FREQUENCY  PT  (By licensed PT), OT (By licensed OT)     PT Frequency: at least 5 times per week OT Frequency: at least 5 times per week            Contractures Contractures Info: Not present    Additional Factors Info  Code Status, Allergies Code Status Info: DNR Allergies Info: NKDA           Current Medications (09/23/2019):  This is the current hospital active medication list Current Facility-Administered Medications  Medication Dose Route Frequency Provider Last Rate Last Admin  . 0.9 %  sodium chloride infusion   Intravenous Continuous Andris Baumann, MD 100 mL/hr at 09/23/19 0921 New Bag at 09/23/19 0921  . acetaminophen (TYLENOL) tablet 650 mg  650 mg Oral Q6H PRN Andris Baumann, MD       Or  . acetaminophen (TYLENOL) suppository 650 mg  650 mg Rectal Q6H PRN Andris Baumann, MD      . heparin injection 5,000 Units  5,000 Units Subcutaneous Q8H Delfino Lovett, MD   5,000 Units at 09/23/19 [phone]  . insulin aspart (novoLOG) injection 0-15 Units  0-15 Units Subcutaneous TID WC Lindajo Royal V, MD      . ondansetron [facility]) tablet 4 mg  4 mg Oral Q6H PRN Andris Baumann, MD       Or  . ondansetron [facility]) injection 4 mg  4 mg Intravenous Q6H PRN Andris Baumann, MD  Discharge Medications: Please see discharge summary for a list of discharge medications.  Relevant Imaging Results:  Relevant Lab Results:   Additional Information SS3 [id number]  Su Hilt, RN

## 2019-09-23 NOTE — Progress Notes (Signed)
[place] [facility]   PATIENT NAME: Jerry Barrett    MR#:  [id number]  DATE OF BIRTH:  1933-01-03  SUBJECTIVE:  CHIEF COMPLAINT:   Chief Complaint  Patient presents with  . Abnormal Lab  no complaints, more alert today, creat improving. wife at bedside.  REVIEW OF SYSTEMS:  Review of Systems  Constitutional: Negative for diaphoresis, fever, malaise/fatigue and weight loss.  HENT: Negative for ear discharge, ear pain, hearing loss, nosebleeds, sore throat and tinnitus.   Eyes: Negative for blurred vision and pain.  Respiratory: Negative for cough, hemoptysis, shortness of breath and wheezing.   Cardiovascular: Negative for chest pain, palpitations, orthopnea and leg swelling.  Gastrointestinal: Negative for abdominal pain, blood in stool, constipation, diarrhea, heartburn, nausea and vomiting.  Genitourinary: Negative for dysuria, frequency and urgency.  Musculoskeletal: Negative for back pain and myalgias.  Skin: Negative for itching and rash.  Neurological: Negative for dizziness, tingling, tremors, focal weakness, seizures, weakness and headaches.  Psychiatric/Behavioral: Negative for depression. The patient is not nervous/anxious.    DRUG ALLERGIES:  No Known Allergies VITALS:  Blood pressure 129/62, pulse 64, temperature 97.7 F (36.5 C), temperature source Oral, resp. rate 16, height 6' (1.829 m), weight 83.9 kg, SpO2 100 %. PHYSICAL EXAMINATION:  Physical Exam HENT:     Head: Normocephalic and atraumatic.  Eyes:     Conjunctiva/sclera: Conjunctivae normal.     Pupils: Pupils are equal, round, and reactive to light.  Neck:     Thyroid: No thyromegaly.     Trachea: No tracheal deviation.  Cardiovascular:     Rate and Rhythm: Normal rate and regular rhythm.     Heart sounds: Normal heart sounds.  Pulmonary:     Effort: Pulmonary effort is normal. No respiratory distress.     Breath sounds: Normal breath sounds. No wheezing.  Chest:     Chest wall: No  tenderness.  Abdominal:     General: Bowel sounds are normal. There is no distension.     Palpations: Abdomen is soft.     Tenderness: There is no abdominal tenderness.  Musculoskeletal:        General: Normal range of motion.     Cervical back: Normal range of motion and neck supple.  Skin:    General: Skin is warm and dry.     Findings: No rash.  Neurological:     Mental Status: He is alert. He is confused.     Cranial Nerves: No cranial nerve deficit.    LABORATORY PANEL:  Male CBC Recent Labs  Lab 09/23/19 0444  WBC 9.5  HGB 12.9*  HCT 36.9*  PLT 164   ------------------------------------------------------------------------------------------------------------------ Chemistries  Recent Labs  Lab 09/22/19 0039 09/22/19 0540 09/23/19 0444  NA 139   < > 143  K 6.1*   < > 5.3*  CL 105   < > 113*  CO2 20*   < > 17*  GLUCOSE 137*   < > 99  BUN 109*   < > 94*  CREATININE 6.52*   < > 5.36*  CALCIUM 9.4   < > 8.7*  MG 2.8*  --   --   AST 17  --   --   ALT 17  --   --   ALKPHOS 69  --   --   BILITOT 1.2  --   --    < > = values in this interval not displayed.   RADIOLOGY:  No results found. ASSESSMENT AND PLAN:  AKI (acute kidney injury) ([place]) Hyperkalemia -Likely prerenal, related to decreased oral intake. No reports of nausea vomiting.Urinalysis negative -Wife shows report from [place] primary - concerning for protein in the urine in the lab in January 2021 -CT abdomen and pelvis neg for obstruction -CT head neg for acute pathology -improving with IV hydration. Creat 6.52 -> 5.36  Diabetes (HCC) -Sliding scale insulin coverage  HTN (hypertension) -Hold antihypertensives given soft blood pressure  Hypothyroidism -Continue levothyroxine pending med rec -normal TSH  History of CVA on3/26/21 treated withtPA with complication of small intraparenchymal bleed Hemiparesis and other late effects of cerebrovascular accident ([place]) -CT head  not showing any acute pathology or bleed -Continue aspirin and statin -Increasednursing assistance  Appreciate PC input.  Status is: Inpatient  Remains inpatient appropriate because:IV treatments appropriate due to intensity of illness or inability to take PO   Dispo: The patient is from: SNF              Anticipated d/c is to: SNF              Anticipated d/c date is: > 3 days              Patient currently is not medically stable to d/c.      DVT prophylaxis: Heparin Family Communication: updated wife at bedside   All the records are reviewed and case discussed with Care Management/Social Worker. Management plans discussed with the patient, family and they are in agreement.  CODE STATUS: DNR  TOTAL TIME TAKING CARE OF THIS PATIENT: 35 minutes.   More than 50% of the time was spent in counseling/coordination of care: YES  POSSIBLE D/C IN 3-4 DAYS, DEPENDING ON CLINICAL CONDITION.   Max Sane M.D on 09/23/2019 at 6:40 PM  [facility]   CC: Primary care physician; [facility]  Note: This dictation was prepared with Dragon dictation along with smaller phrase technology. Any transcriptional errors that result from this process are unintentional.

## 2019-09-23 NOTE — Progress Notes (Signed)
CH encountered pt.'s niece in 1st floor hallway, sitting on bench.  Niece said pt. had suffered a stroke recently --> discharged after this to [facility] --> developed kidney problems and readmitted to [facility]; pt.'s wife @ bedside reportedly.  Niece tearfully shared that her sister had died in 2022/08/28 very suddenly; sister found dead in yard.  Chaplaincy support may be helpful for family; will visit/make referral as able.

## 2019-09-23 NOTE — Progress Notes (Signed)
Daily Progress Note   Patient Name: Jerry Barrett       Date: 09/23/2019 DOB: [birth year]  Age: 87 y.o. MRN#: [id number] Attending Physician: Delfino Lovett, MD Primary Care Physician: Jerrilyn [facility] Admit Date: 09/22/2019  Reason for Consultation/Follow-up: Establishing goals of care  Subjective: During my exam patient sleeping - does not wake to voice or gentle physical stimulation. Had just had SLP evaluation - did well, was awake during evaluation.   Length of Stay: 1  Current Medications: Scheduled Meds:  . heparin injection (subcutaneous)  5,000 Units Subcutaneous Q8H  . insulin aspart  0-15 Units Subcutaneous TID WC    Continuous Infusions: . sodium chloride 100 mL/hr at 09/23/19 0921    PRN Meds: acetaminophen **OR** acetaminophen, ondansetron **OR** ondansetron (ZOFRAN) IV  Physical Exam Constitutional:      General: He is not in acute distress.    Comments: Sleeping, does not wake to verbal stimulation or gentle physical stimulation  Pulmonary:     Effort: Pulmonary effort is normal. No respiratory distress.  Musculoskeletal:     Right lower leg: No edema.     Left lower leg: No edema.  Skin:    General: Skin is warm and dry.  Neurological:     Comments: Unable to answer orientation questions             Vital Signs: BP 101/81 (BP Location: Left Arm)   Pulse 77   Temp (!) 97.4 F (36.3 C) (Oral)   Resp 20   Ht 6' (1.829 m)   Wt 83.9 kg   SpO2 95%   BMI 25.09 kg/m  SpO2: SpO2: 95 % O2 Device: O2 Device: Room Air O2 Flow Rate:    Intake/output summary:   Intake/Output Summary (Last 24 hours) at 09/23/2019 3382 Last data filed at 09/23/2019 0400 Gross per 24 hour  Intake 2249.05 ml  Output 100 ml  Net 2149.05 ml   LBM:   Baseline Weight: Weight: 83.9  kg Most recent weight: Weight: 83.9 kg       Palliative Assessment/Data: PPS 40%    Flowsheet Rows     Most Recent Value  Intake Tab  Referral Department  Hospitalist  Unit at Time of Referral  Med/Surg Unit  Palliative Care Primary Diagnosis  Neurology  Date Notified  09/22/19  Palliative Care Type  New Palliative care  Reason for referral  Clarify Goals of Care  Date of Admission  09/22/19  # of days IP prior to Palliative referral  0  Clinical Assessment  Palliative Performance Scale Score  40%  Psychosocial & Spiritual Assessment  Palliative Care Outcomes  Patient/Family meeting held?  Yes  Who was at the meeting?  wife  Palliative Care Outcomes  Clarified goals of care      Patient Active Problem List   Diagnosis Date Noted  . Goals of care, counseling/discussion   . Palliative care by specialist   . AKI (acute kidney injury) (HCC) 09/22/2019  . History of CVA with residual deficit 09/22/2019  . Hemiparesis and other late effects of cerebrovascular accident (HCC) 09/22/2019  . Hyperkalemia 09/22/2019  . SIRS (systemic inflammatory response syndrome) (HCC) 07/19/2017  . Influenza A  07/19/2017  . Diabetes ([place]) 07/19/2017  . HTN (hypertension) 07/19/2017  . Hypothyroidism 07/19/2017  . HLD (hyperlipidemia) 07/19/2017    Palliative Care Assessment & Plan   HPI: 86 y.o. male  with past medical history of recent CVA (09/02/19) with residual L hemiparesis and aphasia, DM, HTN, and hypothyroidism admitted on 09/22/2019 with creatinine > 6. Concern AKI is d/t poor PO intake. PMT consulted for [place].  Assessment: Follow up today - noted improvement in creatinine. SLP eval ordered. Patient did well with SLP eval. Ate and drank during evaluation but quickly falls back to sleep when not being stimulated and does not want food/drink when offered.   Had meeting at bedside with wife and niece Marlowe Kays to join. Update Marlowe Kays on conversation with wife yesterday - Marlowe Kays also shares  that prior to stroke patient was functioning very well, eating well, and she was not aware of any cognitive decline. She shares that his current presentation is far from baseline. They also share unknown new baseline since CVA since they have been unable to see him at rehab - though they do know he was walking with a walker at rehab - they had also been told he was eating well.   We discussed his kidney failure and mental status changes. Family has good understanding of situation and all questions were answered.  Again discussed patient's wishes per MOST form - specifically DNR and no feeding tube. Discussed that these desires are medically appropriate and we will honor patient's wishes.   Family wishes to continue current interventions and see how patient responds. They are open to continuing goals of care conversations as hospitalization progresses. They would like to see how kidney function does with continued hydration and how his mental status is as well prior to making any further decisions regarding goals of care.   At this point, they are interested in him returning to rehab but we discussed we can continue these conversations depending on hospital progress and determine what is most appropriate.   Recommendations/Plan:  Will continue to follow - if patient discharges before we see again next week please request palliative to follow outpatient  Patient has completed MOST - DNR, no feeding tube  Suspect mild memory impairment with wife - think it is beneficial to include niece in all medical conversations and wife is agreeable to this  Family interested in return to rehab - will continue conversations depending on hospital course  Code Status:  DNR  Prognosis:   Unable to determine  Discharge Planning:  To Be Determined  Care plan was discussed with patient's wife and niece  Thank you for allowing the Palliative Medicine Team to assist in the care of this patient.   Total  Time 35 minutes Prolonged Time Billed  no       Greater than 50%  of this time was spent counseling and coordinating care related to the above assessment and plan.  Juel Burrow, DNP, [facility] Palliative Medicine Team Team Phone # [phone]  Pager [phone]

## 2019-09-23 NOTE — Progress Notes (Signed)
D: Unable to assess how alert and oriented pt is d/t pt being non verbal.  Pt does not express experiencing any pain at this time. Pt is calm and cooperative during day shift with family presences. Pt worked with PT using RW OOB walking around nurse's station. Once finished PT sat pt in chair. When dinning came pt began to feed self while sitting in chair.  A: Scheduled medications administered to pt, per MD orders. Support and encouragement provided. Frequent verbal contact made.   R: No adverse drug reactions noted. Pt complaint with medications and treatment plan. Pt interacts well with staff on the unit. Pt is stable at this time, Will continue to monitor and provide care for as ordered.

## 2019-09-23 NOTE — TOC Progression Note (Signed)
Transition of Care [facility]) - Progression Note    Patient Details  Name: Jerry Barrett MRN: [id number] Date of Birth: May 11, 1933  [facility] CM/SW Contact  Barrie Dunker, RN Phone Number: 09/23/2019, 9:17 AM  Clinical Narrative:    The family would like the patient to go back to [facility] for Short term once DC FL2, PASSr and bed search sent        Expected Discharge Plan and Services                                                 Social Determinants of Health (SDOH) Interventions    Readmission Risk Interventions No flowsheet data found.

## 2019-09-23 NOTE — Evaluation (Signed)
Physical Therapy Evaluation Patient Details Name: Jerry Barrett MRN: [id number] DOB: 06/08/1933 Today's Date: 09/23/2019   History of Present Illness  86 y.o. male s/p stroke ~3 weeks ago, who was admitted to [facility] on 09/22/2019 for evaluation of acute kidney injury.  Patient presented to the ED via EMS from [facility] for abnormal renal labs.  Clinical Impression  Pt with very poor overall interaction, awareness and insight into given tasks and situation. Unable to even state name despite multimodal cues to gather some information, sparse verbalizations with varying degrees of relevance.  Pt could not truly initiate any voluntary motions despite plenty of cues, only initiated activities with direct physical assist to initiate.  Per family he has been significantly limited in this sense since the stroke in March but just a few days ago was much better able to do some speaking and purposeful and self-driven activity.  Pt very unsafe and poor insight at this time.      Follow Up Recommendations Supervision/Assistance - 24 hour;SNF    Equipment Recommendations  Rolling walker with 5" wheels    Recommendations for Other Services       Precautions / Restrictions Precautions Precautions: Fall Restrictions Weight Bearing Restrictions: No      Mobility  Bed Mobility Overal bed mobility: Needs Assistance Bed Mobility: Supine to Sit     Supine to sit: Mod assist     General bed mobility comments: Pt unable to initiate movement and even with multimodal cuing to get to EOB he ultimately needed heavy direct assist to actually rise to sitting  Transfers Overall transfer level: Needs assistance Equipment used: Rolling walker (2 wheeled) Transfers: Sit to/from Stand Sit to Stand: Mod assist         General transfer comment: Pt again unable to initiate voluntary rise to standing, with direct assist he did give some effort but showed poor overall  awareness  Ambulation/Gait Ambulation/Gait assistance: Min assist Gait Distance (Feet): 200 Feet Assistive device: Rolling walker (2 wheeled) Gait Pattern/deviations: Scissoring;Shuffle;Drifts right/left     General Gait Details: Pt ultimately was able to circumambulate the nurses' station but essentially appeared to be on "auto-pilot."  He struggled with directional cues as well as gait training cues, showed little insight regarding walker use and needed constant physical and tactile cues to stay safe and on task.  Pt's vital stable t/o the effort and despite some drifting he did show ability to recognize most obstacles (did have 2 instances of impulsive and unpredictable thrust of walker that was unsafe)  Science writer    Modified Rankin (Stroke Patients Only)       Balance Overall balance assessment: Needs assistance Sitting-balance support: Single extremity supported Sitting balance-Leahy Scale: Fair Sitting balance - Comments: awareness biggest factor       Standing balance comment: Pt reliant on the walker but showed poor insight t/o standing attempt.  Multiple stagger steps and bouts of unsteadiness.                             Pertinent Vitals/Pain Pain Assessment: (does not indicate pain, unable to answer)    Home Living Family/patient expects to be discharged to:: Skilled nursing facility                      Prior Function Level of Independence: Independent  Comments: prior to CVA apparently he was walking 40 minutes 5x/wk and able to be relatively active     Hand Dominance        Extremity/Trunk Assessment   Upper Extremity Assessment Upper Extremity Assessment: Difficult to assess due to impaired cognition(appears functional, unable to vol move consistently)    Lower Extremity Assessment Lower Extremity Assessment: Difficult to assess due to impaired cognition(appears functional, unable to  vol move consistently)       Communication   Communication: Expressive difficulties;Receptive difficulties(very little verbal interaction or general awareness)  Cognition   Behavior During Therapy: Flat affect Overall Cognitive Status: Impaired/Different from baseline                                 General Comments: since CVA apparently he has been minimally interactive.  Family present and reports that he is very impaired since CVA but seems to be more limited now with kidney issues      General Comments General comments (skin integrity, edema, etc.): Pt with very sparse and limited verbal interactions.  Unable to follow even simple verbal instructions, did respond to tactile/phyiscal cuing/assist.    Exercises     Assessment/Plan    PT Assessment Patient needs continued PT services  PT Problem List Decreased strength;Decreased range of motion;Decreased activity tolerance;Decreased mobility;Decreased balance;Decreased coordination;Decreased cognition;Decreased knowledge of use of DME;Decreased safety awareness       PT Treatment Interventions DME instruction;Gait training;Functional mobility training;Therapeutic activities;Therapeutic exercise;Neuromuscular re-education;Balance training;Cognitive remediation;Patient/family education    PT Goals (Current goals can be found in the Care Plan section)  Acute Rehab PT Goals Patient Stated Goal: family hopes to be able to eventually take him home PT Goal Formulation: With patient Time For Goal Achievement: 10/07/19 Potential to Achieve Goals: Fair    Frequency Min 2X/week   Barriers to discharge        Co-evaluation               AM-PAC PT "6 Clicks" Mobility  Outcome Measure Help needed turning from your back to your side while in a flat bed without using bedrails?: A Lot Help needed moving from lying on your back to sitting on the side of a flat bed without using bedrails?: A Lot Help needed moving to  and from a bed to a chair (including a wheelchair)?: A Lot Help needed standing up from a chair using your arms (e.g., wheelchair or bedside chair)?: A Lot Help needed to walk in hospital room?: A Little Help needed climbing 3-5 steps with a railing? : A Lot 6 Click Score: 13    End of Session Equipment Utilized During Treatment: Gait belt Activity Tolerance: Patient tolerated treatment well Patient left: with chair alarm set;with call bell/phone within reach;with family/visitor present Nurse Communication: Mobility status(mitts off for meal, nursing aware to assist) PT Visit Diagnosis: Unsteadiness on feet (R26.81);Other abnormalities of gait and mobility (R26.89);Other symptoms and signs involving the nervous system (R29.898)    Time: 4680-3212 PT Time Calculation (min) (ACUTE ONLY): 31 min   Charges:   PT Evaluation $PT Eval Low Complexity: 1 Low PT Treatments $Gait Training: 8-22 mins        Malachi Pro, DPT 09/23/2019, 6:10 PM

## 2019-09-24 ENCOUNTER — Inpatient Hospital Stay: Payer: Medicare Other

## 2019-09-24 DIAGNOSIS — F0280 Dementia in other diseases classified elsewhere without behavioral disturbance: Secondary | ICD-10-CM

## 2019-09-24 DIAGNOSIS — Z7189 Other specified counseling: Secondary | ICD-10-CM

## 2019-09-24 DIAGNOSIS — G309 Alzheimer's disease, unspecified: Secondary | ICD-10-CM

## 2019-09-24 LAB — BASIC METABOLIC PANEL
Anion gap: 9 (ref 5–15)
BUN: 78 mg/dL — ABNORMAL HIGH (ref 8–23)
CO2: 18 mmol/L — ABNORMAL LOW (ref 22–32)
Calcium: 7.9 mg/dL — ABNORMAL LOW (ref 8.9–10.3)
Chloride: 115 mmol/L — ABNORMAL HIGH (ref 98–111)
Creatinine, Ser: 4.43 mg/dL — ABNORMAL HIGH (ref 0.61–1.24)
GFR calc Af Amer: 13 mL/min — ABNORMAL LOW (ref >60)
GFR calc non Af Amer: 11 mL/min — ABNORMAL LOW (ref >60)
Glucose, Bld: 122 mg/dL — ABNORMAL HIGH (ref 70–99)
Potassium: 5 mmol/L (ref 3.5–5.1)
Sodium: 142 mmol/L (ref 135–145)

## 2019-09-24 LAB — CBC
HCT: 34 % — ABNORMAL LOW (ref 39.0–52.0)
Hemoglobin: 11.7 g/dL — ABNORMAL LOW (ref 13.0–17.0)
MCH: 33.1 pg (ref 26.0–34.0)
MCHC: 34.4 g/dL (ref 30.0–36.0)
MCV: 96.3 fL (ref 80.0–100.0)
Platelets: 149 10*3/uL — ABNORMAL LOW (ref 150–400)
RBC: 3.53 MIL/uL — ABNORMAL LOW (ref 4.22–5.81)
RDW: 13.3 % (ref 11.5–15.5)
WBC: 13.1 10*3/uL — ABNORMAL HIGH (ref 4.0–10.5)
nRBC: 0 % (ref 0.0–0.2)

## 2019-09-24 LAB — GLUCOSE, CAPILLARY
Glucose-Capillary: 95 mg/dL (ref 70–99)
Glucose-Capillary: 99 mg/dL (ref 70–99)
Glucose-Capillary: 133 mg/dL — ABNORMAL HIGH (ref 70–99)

## 2019-09-24 MED ORDER — GLYCOPYRROLATE 0.2 MG/ML IJ SOLN
0.2000 mg | INTRAMUSCULAR | Status: DC | PRN
  Filled 2019-09-24: qty 1

## 2019-09-24 MED ORDER — GLYCOPYRROLATE 1 MG PO TABS
1.0000 mg | ORAL_TABLET | ORAL | Status: DC | PRN
  Filled 2019-09-24: qty 1

## 2019-09-24 MED ORDER — DIPHENHYDRAMINE HCL 50 MG/ML IJ SOLN: 25.0000 mg | INTRAMUSCULAR | Status: DC | PRN

## 2019-09-24 MED ORDER — POLYVINYL ALCOHOL 1.4 % OP SOLN
1.0000 [drp] | Freq: Four times a day (QID) | OPHTHALMIC | Status: DC | PRN
  Filled 2019-09-24: qty 15

## 2019-09-24 NOTE — Progress Notes (Signed)
Chart reviewed, Pt visited. Daughter and wife in the room. Pt sleeping/lethargic at this time. Per wife and daughter Pt ate mashed potatoes, magic cup and thin liquids without difficulty. They felt the food was easy enough to chew. ST told family that if he becomes more consistently alert, we can try a diet upgrade. Daughter reports Pt will go home with hospice on Tuesday. Rec continue with current dysphagia 2 diet with aspiration precautions

## 2019-09-24 NOTE — Progress Notes (Signed)
[place] at [facility]   PATIENT NAME: Jerry Barrett    MR#:  [id number]  DATE OF BIRTH:  03/21/1933  SUBJECTIVE:  CHIEF COMPLAINT:   Chief Complaint  Patient presents with  . Abnormal Lab  He is lethargic today.  Was febrile (T-max of 102.1) REVIEW OF SYSTEMS:  Review of Systems  Unable to perform ROS: Acuity of condition   DRUG ALLERGIES:  No Known Allergies VITALS:  Blood pressure 120/67, pulse 70, temperature (!) 97.3 F (36.3 C), temperature source Axillary, resp. rate 16, height 6' (1.829 m), weight 83.9 kg, SpO2 100 %. PHYSICAL EXAMINATION:  Physical Exam Constitutional:      Appearance: He is ill-appearing.  HENT:     Head: Normocephalic and atraumatic.  Eyes:     Conjunctiva/sclera: Conjunctivae normal.     Pupils: Pupils are equal, round, and reactive to light.  Neck:     Thyroid: No thyromegaly.     Trachea: No tracheal deviation.  Cardiovascular:     Rate and Rhythm: Normal rate and regular rhythm.     Heart sounds: Normal heart sounds.  Pulmonary:     Effort: Pulmonary effort is normal. No respiratory distress.     Breath sounds: Normal breath sounds. No wheezing.  Chest:     Chest wall: No tenderness.  Abdominal:     General: Bowel sounds are normal. There is no distension.     Palpations: Abdomen is soft.     Tenderness: There is no abdominal tenderness.  Musculoskeletal:        General: Normal range of motion.     Cervical back: Normal range of motion and neck supple.  Skin:    General: Skin is warm and dry.     Findings: No rash.  Neurological:     Mental Status: He is lethargic and confused.     Cranial Nerves: No cranial nerve deficit.    LABORATORY PANEL:  Male CBC Recent Labs  Lab 09/24/19 0512  WBC 13.1*  HGB 11.7*  HCT 34.0*  PLT 149*   ------------------------------------------------------------------------------------------------------------------ Chemistries  Recent Labs  Lab 09/22/19 0039 09/22/19 0540  09/24/19 0512  NA 139   < > 142  K 6.1*   < > 5.0  CL 105   < > 115*  CO2 20*   < > 18*  GLUCOSE 137*   < > 122*  BUN 109*   < > 78*  CREATININE 6.52*   < > 4.43*  CALCIUM 9.4   < > 7.9*  MG 2.8*  --   --   AST 17  --   --   ALT 17  --   --   ALKPHOS 69  --   --   BILITOT 1.2  --   --    < > = values in this interval not displayed.   RADIOLOGY:  No results found. ASSESSMENT AND PLAN:   Early sepsis He spiked fever 102.1 last night on 4/16.  Tachycardia with a heart rate of 110 -We will check UA and chest x-ray, 2 sets of blood culture  AKI (acute kidney injury) (HCC) Hyperkalemia -Likely prerenal, related to decreased oral intake. No reports of nausea vomiting.Urinalysis negative -Wife shows report from [facility] primary - concerning for protein in the urine in the lab in January 2021 -CT abdomen and pelvis neg for obstruction -CT head neg for acute pathology -improving with IV hydration. Creat 6.52 -> 5.36->4.43  Diabetes (HCC) HTN (hypertension) Hypothyroidism History of CVA  on3/26/21 treated withtPA with complication of small intraparenchymal bleed Hemiparesis and other late effects of cerebrovascular accident [facility])  I had a long discussion with patient's wife and niece at bedside.  They are leaning towards keeping him comfortable and have hospice follow at home.  They understand overall poor prognosis and would like to keep him comfortable in the home setting.  Niece is working on setting up private caregivers at home.  Likely discharge Tuesday depending on improvement in his renal function.  Placed comfort care orders  Status is: Inpatient  Remains inpatient appropriate because:IV treatments appropriate due to intensity of illness or inability to take PO   Dispo: The patient is from: SNF              Anticipated d/c is to: SNF              Anticipated d/c date is: > 3 days              Patient currently is not medically stable to d/c.   DVT  prophylaxis: Comfort care Family Communication: updated wife and niece at bedside   All the records are reviewed and case discussed with Care Management/Social Worker. Management plans discussed with the patient, family and they are in agreement.  CODE STATUS: DNR  TOTAL TIME TAKING CARE OF THIS PATIENT: 35 minutes.   More than 50% of the time was spent in counseling/coordination of care: YES  POSSIBLE D/C IN 3-4 DAYS, DEPENDING ON CLINICAL CONDITION.   Max Sane M.D on 09/24/2019 at 3:16 PM  [facility]   CC: Primary care physician; [facility]  Note: This dictation was prepared with Dragon dictation along with smaller phrase technology. Any transcriptional errors that result from this process are unintentional.

## 2019-09-24 NOTE — Progress Notes (Signed)
   09/24/19 1500  Clinical Encounter Type  Visited With Patient and family together  Visit Type Follow-up  Referral From Physician  Consult/Referral To Chaplain  Spiritual Encounters  Spiritual Needs Emotional  Stress Factors  Patient Stress Factors Health changes;Loss of control  Family Stress Factors Loss;Loss of control;Major life changes  CH visited with PT and wife. PT was lying in the bed non-verbal with the exception of a few words. Wife stated that PT had a severe stroke and has made his way from [place] to [place]. Wife is hopeful that PT will recover. This concludes the visit.

## 2019-09-24 NOTE — Sepsis Progress Note (Signed)
MEWS score 4/red, Temp 102.1, BP 129/88, HR 113, RR 16. At 2246 sent messaged to NP Webb Silversmith to notify. At 2249 called Rapid Response RN Melburn Popper notified her. At 2256 NP Ouma replied "Will give Tylenol and bolus 500cc due to AKI".

## 2019-09-24 NOTE — Progress Notes (Signed)
[facility]  ROUNDING NOTE   Subjective:  Patient seen and evaluated at bedside. Patient's wife at bedside. Renal function does appear to be slowly improving. Creatinine down to 4.4.   Objective:  Vital signs in last 24 hours:  Temp:  [97.3 F (36.3 C)-102.1 F (38.9 C)] 97.4 F (36.3 C) (04/17 1709) Pulse Rate:  [70-110] 78 (04/17 1709) Resp:  [16-20] 16 (04/17 1709) BP: (120-151)/(55-85) 151/85 (04/17 1709) SpO2:  [73 %-100 %] 99 % (04/17 1709)  Weight change:  Filed Weights   09/22/19 0026  Weight: 83.9 kg    Intake/Output: I/O last 3 completed shifts: In: 1769.1 [I.V.:1769.1] Out: 3 [Urine:3]   Intake/Output this shift:  Total I/O In: -  Out: 1100 [Urine:1100]  Physical Exam: General: No acute distress  Head: Normocephalic, atraumatic. Moist oral mucosal membranes  Eyes: Anicteric  Neck: Supple, trachea midline  Lungs:  Clear to auscultation, normal effort  Heart: S1S2 no rubs  Abdomen:  Soft, nontender, bowel sounds present  Extremities: No peripheral edema.  Neurologic: Awake, alert, following commands  Skin: No acute rash       Basic Metabolic Panel: Recent Labs  Lab 09/22/19 0039 09/22/19 0039 09/22/19 0540 09/23/19 0444 09/24/19 0512  NA 139  --  142 143 142  K 6.1*  --  4.7 5.3* 5.0  CL 105  --  112* 113* 115*  CO2 20*  --  19* 17* 18*  GLUCOSE 137*  --  123* 99 122*  BUN 109*  --  102* 94* 78*  CREATININE 6.52*  --  6.12* 5.36* 4.43*  CALCIUM 9.4   < > 8.9 8.7* 7.9*  MG 2.8*  --   --   --   --   PHOS 6.5*  --   --   --   --    < > = values in this interval not displayed.    Liver Function Tests: Recent Labs  Lab 09/22/19 0039  AST 17  ALT 17  ALKPHOS 69  BILITOT 1.2  PROT 7.9  ALBUMIN 4.5   Recent Labs  Lab 09/22/19 0039  LIPASE 43   No results for input(s): AMMONIA in the last 168 hours.  CBC: Recent Labs  Lab 09/22/19 0039 09/22/19 0540 09/23/19 0444 09/24/19 0512  WBC 8.6 8.2 9.5 13.1*   NEUTROABS 6.0  --   --   --   HGB 13.7 12.3* 12.9* 11.7*  HCT 39.5 36.2* 36.9* 34.0*  MCV 95.6 97.8 95.6 96.3  PLT 215 173 164 149*    Cardiac Enzymes: No results for input(s): CKTOTAL, CKMB, CKMBINDEX, TROPONINI in the last 168 hours.  BNP: Invalid input(s): POCBNP  CBG: Recent Labs  Lab 09/23/19 2135 09/23/19 2204 09/24/19 0806 09/24/19 1138 09/24/19 1711  GLUCAP 62* 91 95 99 133*    Microbiology: Results for orders placed or performed during the hospital encounter of 09/22/19  Urine Culture     Status: None   Collection Time: 09/22/19 12:39 AM   Specimen: Urine, Clean Catch  Result Value Ref Range Status   Specimen Description   Final    URINE, CLEAN CATCH Performed at [facility], [address], [place], [place] [address]    Special Requests   Final    Normal Performed at [facility], [address], [place], [place] [address]    Culture   Final    NO GROWTH Performed at [facility], [place] [address], [place],  [address]  Report Status 09/22/2019 FINAL  Final  SARS Coronavirus 2 by RT PCR     Status: None   Collection Time: 09/22/19 12:39 AM  Result Value Ref Range Status   SARS Coronavirus 2 NEGATIVE NEGATIVE Final    Comment: (NOTE) Result indicates the ABSENCE of SARS-CoV-2 RNA in the patient specimen.  The lowest concentration of SARS-CoV-2 viral copies this assay can detect in nasopharyngeal swab specimens is 500 copies / mL.  A negative result does not preclude SARS-CoV-2 infection and should not be used as the sole basis for patient management decisions. A negative result may occur with improper specimen collection / handling, submission of a specimen other than nasopharyngeal swab, presence of viral mutation(s) within the areas targeted by this assay, and inadequate number of viral copies (<500 copies / mL) present.  Negative results must be combined with clinical observations, patient history, and  epidemiological information.  The expected result is NEGATIVE.  Patient Fact Sheet:  BlogSelections.co.uk   Provider Fact Sheet:  https://lucas.com/   This test is not yet approved or cleared by the [place] FDA and  has been authorized for  detection and/or diagnosis of SARS-CoV-2 by FDA under an Emergency Use Authorization (EUA).  This EUA will remain in effect (meaning this test can be used) for the duration of  the COVID-19 declaration under Section 564(b)(1) of the Act, 21 U.S.C. section 360bbb-3(b)(1), unless the authorization is terminated or revoked sooner Performed at [facility], [place] [address] [place], [place] [address]     Coagulation Studies: No results for input(s): LABPROT, INR in the last 72 hours.  Urinalysis: Recent Labs    09/22/19 0039  COLORURINE YELLOW*  LABSPEC 1.019  PHURINE 5.0  GLUCOSEU NEGATIVE  HGBUR NEGATIVE  BILIRUBINUR NEGATIVE  KETONESUR NEGATIVE  PROTEINUR 30*  NITRITE NEGATIVE  LEUKOCYTESUR NEGATIVE      Imaging: DG Chest Port 1 View  Result Date: 09/24/2019 CLINICAL DATA:  Fever, tachycardia. EXAM: PORTABLE CHEST 1 VIEW COMPARISON:  July 19, 2017. FINDINGS: The heart size and mediastinal contours are within normal limits. No pneumothorax or pleural effusion is noted. Both lungs are clear. The visualized skeletal structures are unremarkable. IMPRESSION: No active disease. Aortic Atherosclerosis (ICD10-I70.0). Electronically Signed   By: Marijo Conception M.D.   On: 09/24/2019 15:47     Medications:   . sodium chloride 100 mL/hr at 09/24/19 1219   . docusate  50 mg Oral BID  . sennosides  5 mL Oral BID   acetaminophen **OR** acetaminophen, diphenhydrAMINE, glycopyrrolate **OR** glycopyrrolate **OR** glycopyrrolate, ondansetron **OR** ondansetron (ZOFRAN) IV, polyvinyl alcohol  Assessment/ Plan:  86 y.o. male with past medical history of dementia, diabetes mellitus type 2,  hypertension, history of CVA with residual left-sided weakness, aphasia now admitted with acute kidney injury, chronic kidney disease stage IIIa  1.  Acute kidney injury/chronic kidney disease stage IIIa baseline EGFR 55 from 09/10/2019.  Imaging upon admission negative for renal obstruction.  Patient was on lisinopril as an outpatient. -Renal function does appear to be improving.  Creatinine down to 4.4.  Continue IV fluid hydration for now and avoid nephrotoxins as possible.  2.  Hyperkalemia.  Potassium now within the normal range.  Administer Lokelma or Veltassa as needed for hyperkalemia.  3.  Anemia of chronic kidney disease.  Hemoglobin 11.7.  No acute indication for Epogen at this time.   LOS: 2 Adianna Darwin 4/17/20216:37 PM

## 2019-09-24 NOTE — Plan of Care (Signed)
  Problem: Clinical Measurements: Goal: Will remain free from infection Outcome: Not Progressing Note: MEWS score 4/red during shift. NP notified and Rapid response RN, IV bolus given and oral Tylenol

## 2019-09-25 LAB — BASIC METABOLIC PANEL
Anion gap: 9 (ref 5–15)
BUN: 61 mg/dL — ABNORMAL HIGH (ref 8–23)
CO2: 16 mmol/L — ABNORMAL LOW (ref 22–32)
Calcium: 7.8 mg/dL — ABNORMAL LOW (ref 8.9–10.3)
Chloride: 117 mmol/L — ABNORMAL HIGH (ref 98–111)
Creatinine, Ser: 3.26 mg/dL — ABNORMAL HIGH (ref 0.61–1.24)
GFR calc Af Amer: 19 mL/min — ABNORMAL LOW (ref >60)
GFR calc non Af Amer: 16 mL/min — ABNORMAL LOW (ref >60)
Glucose, Bld: 172 mg/dL — ABNORMAL HIGH (ref 70–99)
Potassium: 4.3 mmol/L (ref 3.5–5.1)
Sodium: 142 mmol/L (ref 135–145)

## 2019-09-25 NOTE — Progress Notes (Signed)
[place] [facility] [place] NAME: Jerry Barrett    MR#:  [id number]  DATE OF BIRTH:  Feb 06, 1933  SUBJECTIVE:  CHIEF COMPLAINT:   Chief Complaint  Patient presents with  . Abnormal Lab  Remains lethargic.  Family would like to keep him comfortable only, agreeable for hospice at home (T-max of 99.8) REVIEW OF SYSTEMS:  Review of Systems  Unable to perform ROS: Acuity of condition   DRUG ALLERGIES:  No Known Allergies VITALS:  Blood pressure (!) 165/117, pulse (!) 104, temperature 98.2 F (36.8 C), temperature source Oral, resp. rate 16, height 6' (1.829 m), weight 83.9 kg, SpO2 99 %. PHYSICAL EXAMINATION:  Physical Exam Constitutional:      Appearance: He is ill-appearing.  HENT:     Head: Normocephalic and atraumatic.  Eyes:     Conjunctiva/sclera: Conjunctivae normal.     Pupils: Pupils are equal, round, and reactive to light.  Neck:     Thyroid: No thyromegaly.     Trachea: No tracheal deviation.  Cardiovascular:     Rate and Rhythm: Normal rate and regular rhythm.     Heart sounds: Normal heart sounds.  Pulmonary:     Effort: Pulmonary effort is normal. No respiratory distress.     Breath sounds: Normal breath sounds. No wheezing.  Chest:     Chest wall: No tenderness.  Abdominal:     General: Bowel sounds are normal. There is no distension.     Palpations: Abdomen is soft.     Tenderness: There is no abdominal tenderness.  Musculoskeletal:        General: Normal range of motion.     Cervical back: Normal range of motion and neck supple.  Skin:    General: Skin is warm and dry.     Findings: No rash.  Neurological:     Mental Status: He is lethargic and confused.     Cranial Nerves: No cranial nerve deficit.    LABORATORY PANEL:  Male CBC Recent Labs  Lab 09/24/19 0512  WBC 13.1*  HGB 11.7*  HCT 34.0*  PLT 149*    ------------------------------------------------------------------------------------------------------------------ Chemistries  Recent Labs  Lab 09/22/19 0039 09/22/19 0540 09/25/19 0944  NA 139   < > 142  K 6.1*   < > 4.3  CL 105   < > 117*  CO2 20*   < > 16*  GLUCOSE 137*   < > 172*  BUN 109*   < > 61*  CREATININE 6.52*   < > 3.26*  CALCIUM 9.4   < > 7.8*  MG 2.8*  --   --   AST 17  --   --   ALT 17  --   --   ALKPHOS 69  --   --   BILITOT 1.2  --   --    < > = values in this interval not displayed.   RADIOLOGY:  DG Chest Port 1 View  Result Date: 09/24/2019 CLINICAL DATA:  Fever, tachycardia. EXAM: PORTABLE CHEST 1 VIEW COMPARISON:  July 19, 2017. FINDINGS: The heart size and mediastinal contours are within normal limits. No pneumothorax or pleural effusion is noted. Both lungs are clear. The visualized skeletal structures are unremarkable. IMPRESSION: No active disease. Aortic Atherosclerosis (ICD10-I70.0). Electronically Signed   By: Marijo Conception M.D.   On: 09/24/2019 15:47   ASSESSMENT AND PLAN:   Early sepsis Fever curve trending down the T-max of 99.8 yesterday. somewhat tachycardic -Negative UA and  chest x-ray, 2 sets of blood culture so far not growing anything -Likely viral in nature  AKI (acute kidney injury) (HCC) Hyperkalemia -Likely prerenal, related to decreased oral intake. No reports of nausea vomiting.Urinalysis negative -Wife shows report from [facility] primary - concerning for protein in the urine in the lab in January 2021 -CT abdomen and pelvis neg for obstruction -CT head neg for acute pathology -improving with IV hydration. Creat 6.52 -> 5.36->4.43->3.26  Diabetes (HCC) HTN (hypertension) Hypothyroidism History of CVA on3/26/21 treated withtPA with complication of small intraparenchymal bleed Hemiparesis and other late effects of cerebrovascular accident [facility])  I had a long discussion with patient's wife and niece at  bedside.  They are leaning towards keeping him comfortable and have hospice follow at home.  They understand overall poor prognosis and would like to keep him comfortable in the home setting.  Niece is working on setting up private caregivers at home.  Likely discharge Tuesday   Placed comfort care orders  Status is: Inpatient  Remains inpatient appropriate because:IV treatments appropriate due to intensity of illness or inability to take PO   Dispo: The patient is from: SNF              Anticipated d/c is to: Home with hospice              Anticipated d/c date is: 2 days              Patient currently is not medically stable to d/c.   DVT prophylaxis: Comfort care Family Communication: updated wife and niece at bedside   All the records are reviewed and case discussed with Care Management/Social Worker. Management plans discussed with the patient, family and they are in agreement.  CODE STATUS: DNR  TOTAL TIME TAKING CARE OF THIS PATIENT: 35 minutes.   More than 50% of the time was spent in counseling/coordination of care: YES  POSSIBLE D/C IN 2 DAYS, DEPENDING ON CLINICAL CONDITION.   Delfino Lovett M.D on 09/25/2019 at 1:11 PM  [facility]   CC: Primary care physician; [facility]  Note: This dictation was prepared with Dragon dictation along with smaller phrase technology. Any transcriptional errors that result from this process are unintentional.

## 2019-09-25 NOTE — Plan of Care (Signed)
  Problem: Safety: Goal: Ability to remain free from injury will improve Outcome: Progressing   Problem: Skin Integrity: Goal: Risk for impaired skin integrity will decrease Outcome: Progressing   

## 2019-09-25 NOTE — Progress Notes (Signed)
[facility]  ROUNDING NOTE   Subjective:  Renal function does appear to be improving. Creatinine down to 3.26. Good urine output 1.9 L. Wife at the bedside.   Objective:  Vital signs in last 24 hours:  Temp:  [98.2 F (36.8 C)] 98.2 F (36.8 C) (04/18 0810) Pulse Rate:  [104] 104 (04/18 0810) Resp:  [16] 16 (04/18 0810) BP: (165)/(117) 165/117 (04/18 0810)  Weight change:  Filed Weights   09/22/19 0026  Weight: 83.9 kg    Intake/Output: I/O last 3 completed shifts: In: -  Out: 1901 [Urine:1901]   Intake/Output this shift:  Total I/O In: -  Out: 800 [Urine:800]  Physical Exam: General: No acute distress  Head: Normocephalic, atraumatic. Moist oral mucosal membranes  Eyes: Anicteric  Neck: Supple, trachea midline  Lungs:  Clear to auscultation, normal effort  Heart: S1S2 no rubs  Abdomen:  Soft, nontender, bowel sounds present  Extremities: No peripheral edema.  Neurologic: Awake, alert, following commands  Skin: No acute rash       Basic Metabolic Panel: Recent Labs  Lab 09/22/19 0039 09/22/19 0039 09/22/19 0540 09/22/19 0540 09/23/19 0444 09/24/19 0512 09/25/19 0944  NA 139  --  142  --  143 142 142  K 6.1*  --  4.7  --  5.3* 5.0 4.3  CL 105  --  112*  --  113* 115* 117*  CO2 20*  --  19*  --  17* 18* 16*  GLUCOSE 137*  --  123*  --  99 122* 172*  BUN 109*  --  102*  --  94* 78* 61*  CREATININE 6.52*  --  6.12*  --  5.36* 4.43* 3.26*  CALCIUM 9.4   < > 8.9   < > 8.7* 7.9* 7.8*  MG 2.8*  --   --   --   --   --   --   PHOS 6.5*  --   --   --   --   --   --    < > = values in this interval not displayed.    Liver Function Tests: Recent Labs  Lab 09/22/19 0039  AST 17  ALT 17  ALKPHOS 69  BILITOT 1.2  PROT 7.9  ALBUMIN 4.5   Recent Labs  Lab 09/22/19 0039  LIPASE 43   No results for input(s): AMMONIA in the last 168 hours.  CBC: Recent Labs  Lab 09/22/19 0039 09/22/19 0540 09/23/19 0444 09/24/19 0512  WBC 8.6  8.2 9.5 13.1*  NEUTROABS 6.0  --   --   --   HGB 13.7 12.3* 12.9* 11.7*  HCT 39.5 36.2* 36.9* 34.0*  MCV 95.6 97.8 95.6 96.3  PLT 215 173 164 149*    Cardiac Enzymes: No results for input(s): CKTOTAL, CKMB, CKMBINDEX, TROPONINI in the last 168 hours.  BNP: Invalid input(s): POCBNP  CBG: Recent Labs  Lab 09/23/19 2135 09/23/19 2204 09/24/19 0806 09/24/19 1138 09/24/19 1711  GLUCAP 62* 91 95 99 133*    Microbiology: Results for orders placed or performed during the hospital encounter of 09/22/19  Urine Culture     Status: None   Collection Time: 09/22/19 12:39 AM   Specimen: Urine, Clean Catch  Result Value Ref Range Status   Specimen Description   Final    URINE, CLEAN CATCH Performed at [facility], [address], [place], [place] [address]    Special Requests   Final    Normal Performed at [facility]  [facility], [address], [place], [place] [address]    Culture   Final    NO GROWTH Performed at [facility], [place] [address], [place], [place] [address]    Report Status 09/22/2019 FINAL  Final  SARS Coronavirus 2 by RT PCR     Status: None   Collection Time: 09/22/19 12:39 AM  Result Value Ref Range Status   SARS Coronavirus 2 NEGATIVE NEGATIVE Final    Comment: (NOTE) Result indicates the ABSENCE of SARS-CoV-2 RNA in the patient specimen.  The lowest concentration of SARS-CoV-2 viral copies this assay can detect in nasopharyngeal swab specimens is 500 copies / mL.  A negative result does not preclude SARS-CoV-2 infection and should not be used as the sole basis for patient management decisions. A negative result may occur with improper specimen collection / handling, submission of a specimen other than nasopharyngeal swab, presence of viral mutation(s) within the areas targeted by this assay, and inadequate number of viral copies (<500 copies / mL) present.  Negative results must be combined with clinical observations, patient history,  and epidemiological information.  The expected result is NEGATIVE.  Patient Fact Sheet:  BlogSelections.co.uk   Provider Fact Sheet:  https://lucas.com/   This test is not yet approved or cleared by the [place] FDA and  has been authorized for  detection and/or diagnosis of SARS-CoV-2 by FDA under an Emergency Use Authorization (EUA).  This EUA will remain in effect (meaning this test can be used) for the duration of  the COVID-19 declaration under Section 564(b)(1) of the Act, 21 U.S.C. section 360bbb-3(b)(1), unless the authorization is terminated or revoked sooner Performed at [facility], [place] [address], [place], [place] [address]   CULTURE, BLOOD (ROUTINE X 2) w Reflex to ID Panel     Status: None (Preliminary result)   Collection Time: 09/24/19  3:39 PM   Specimen: BLOOD  Result Value Ref Range Status   Specimen Description BLOOD BLOOD LEFT HAND  Final   Special Requests   Final    BOTTLES DRAWN AEROBIC AND ANAEROBIC Blood Culture adequate volume   Culture   Final    NO GROWTH < 24 HOURS Performed at [facility], [address] [place], [place] [address]    Report Status PENDING  Incomplete  CULTURE, BLOOD (ROUTINE X 2) w Reflex to ID Panel     Status: None (Preliminary result)   Collection Time: 09/24/19  3:47 PM   Specimen: BLOOD  Result Value Ref Range Status   Specimen Description BLOOD LEFT ANTECUBITAL  Final   Special Requests   Final    BOTTLES DRAWN AEROBIC AND ANAEROBIC Blood Culture results may not be optimal due to an excessive volume of blood received in culture bottles   Culture   Final    NO GROWTH < 24 HOURS Performed at [facility], [place] [place], [place] [address]    Report Status PENDING  Incomplete    Coagulation Studies: No results for input(s): LABPROT, INR in the last 72 hours.  Urinalysis: No results for input(s): COLORURINE, LABSPEC, PHURINE, GLUCOSEU,  HGBUR, BILIRUBINUR, KETONESUR, PROTEINUR, UROBILINOGEN, NITRITE, LEUKOCYTESUR in the last 72 hours.  Invalid input(s): APPERANCEUR    Imaging: DG Chest Port 1 View  Result Date: 09/24/2019 CLINICAL DATA:  Fever, tachycardia. EXAM: PORTABLE CHEST 1 VIEW COMPARISON:  July 19, 2017. FINDINGS: The heart size and mediastinal contours are within normal limits. No pneumothorax or pleural effusion is  noted. Both lungs are clear. The visualized skeletal structures are unremarkable. IMPRESSION: No active disease. Aortic Atherosclerosis (ICD10-I70.0). Electronically Signed   By: Marijo Conception M.D.   On: 09/24/2019 15:47     Medications:   . sodium chloride 100 mL/hr at 09/24/19 1219    acetaminophen **OR** acetaminophen, diphenhydrAMINE, glycopyrrolate **OR** glycopyrrolate **OR** glycopyrrolate, ondansetron **OR** ondansetron (ZOFRAN) IV, polyvinyl alcohol  Assessment/ Plan:  86 y.o. male with past medical history of dementia, diabetes mellitus type 2, hypertension, history of CVA with residual left-sided weakness, aphasia now admitted with acute kidney injury, chronic kidney disease stage IIIa  1.  Acute kidney injury/chronic kidney disease stage IIIa baseline EGFR 55 from 09/10/2019.  Imaging upon admission negative for renal obstruction.  Patient was on lisinopril as an outpatient. -Creatinine down to 3.26.  Continue IV fluid hydration.  Good urine output 1.9 L.  2.  Hyperkalemia.  Potassium continues to be in the normal range at 4.2.  Continue to monitor.  3.  Anemia of chronic kidney disease.  Hemoglobin 11.7.  No acute indication for Epogen at this time.   LOS: 3 Jerry Barrett 4/18/20215:19 PM

## 2019-09-26 DIAGNOSIS — E119 Type 2 diabetes mellitus without complications: Secondary | ICD-10-CM

## 2019-09-26 LAB — BASIC METABOLIC PANEL
Anion gap: 12 (ref 5–15)
BUN: 53 mg/dL — ABNORMAL HIGH (ref 8–23)
CO2: 18 mmol/L — ABNORMAL LOW (ref 22–32)
Calcium: 8.1 mg/dL — ABNORMAL LOW (ref 8.9–10.3)
Chloride: 112 mmol/L — ABNORMAL HIGH (ref 98–111)
Creatinine, Ser: 2.69 mg/dL — ABNORMAL HIGH (ref 0.61–1.24)
GFR calc Af Amer: 24 mL/min — ABNORMAL LOW (ref >60)
GFR calc non Af Amer: 21 mL/min — ABNORMAL LOW (ref >60)
Glucose, Bld: 192 mg/dL — ABNORMAL HIGH (ref 70–99)
Potassium: 4 mmol/L (ref 3.5–5.1)
Sodium: 142 mmol/L (ref 135–145)

## 2019-09-26 NOTE — TOC Progression Note (Signed)
Transition of Care [facility]) - Progression Note    Patient Details  Name: RYDGE TEXIDOR MRN: [id number] Date of Birth: 08/23/1932  Transition of Care [facility]) CM/SW Contact  Barrie Dunker, RN Phone Number: 09/26/2019, 12:27 PM  Clinical Narrative:    Niece called me and notified me that they do want a hospital bed also, I Notified Brad with [employer]  Expected Discharge Plan: Home w Home Health Services Barriers to Discharge: Continued Medical Work up  Expected Discharge Plan and Services Expected Discharge Plan: Home w Home Health Services   Discharge Planning Services: CM Consult Post Acute Care Choice: Home Health Living arrangements for the past 2 months: Single Family Home                 DME Arranged: 3-N-1, Walker rolling DME Agency: [facility] Date DME Agency Contacted: 09/26/19 Time DME Agency Contacted: [phone] Representative spoke with at DME Agency: Nida Boatman HH Arranged: PT, OT, Nurse's Aide, Social Work [employer] Agency: [facility] Date HH Agency Contacted: 09/26/19 Time HH Agency Contacted: [phone] Representative spoke with at [facility] Agency: Barbara Cower   Social Determinants of Health (SDOH) Interventions    Readmission Risk Interventions No flowsheet data found.

## 2019-09-26 NOTE — TOC Progression Note (Signed)
Transition of Care [facility]) - Progression Note    Patient Details  Name: Jerry Barrett MRN: [id number] Date of Birth: [birth year]  Transition of Care [facility]) CM/SW [place], RN Phone Number: 09/26/2019, 9:34 AM  Clinical Narrative:    Met with the patient and the family including his wife and niece at the bedside, They want to take the patient home when ready, he will have [employer] with [facility], want palliative to follow outpatient at home, he need a RW and a 3 in 1, they do not want him to be comfort care, he is alert this morning and feeding himself, he was able to tell me his name.  They have arranged private nursing at home. I notified the physician of the conversation and the family's requests.      Expected Discharge Plan and Services                                                 Social Determinants of Health (SDOH) Interventions    Readmission Risk Interventions No flowsheet data found.

## 2019-09-26 NOTE — Progress Notes (Signed)
[facility]  ROUNDING NOTE   Subjective:  Renal function does appear to be improving slowly. Creatinine now down to 2.6. Wife at the bedside.  Objective:  Vital signs in last 24 hours:     Weight change:  Filed Weights   09/22/19 0026  Weight: 83.9 kg    Intake/Output: I/O last 3 completed shifts: In: -  Out: 2175 [Urine:2175]   Intake/Output this shift:  Total I/O In: 240 [P.O.:240] Out: 200 [Urine:200]  Physical Exam: General: No acute distress  Head: Normocephalic, atraumatic. Moist oral mucosal membranes  Eyes: Anicteric  Neck: Supple, trachea midline  Lungs:  Clear to auscultation, normal effort  Heart: S1S2 no rubs  Abdomen:  Soft, nontender, bowel sounds present  Extremities: No peripheral edema.  Neurologic: Awake, alert, following commands  Skin: No acute rash       Basic Metabolic Panel: Recent Labs  Lab 09/22/19 0039 09/22/19 0039 09/22/19 0540 09/22/19 0540 09/23/19 0444 09/23/19 0444 09/24/19 0512 09/25/19 0944 09/26/19 0938  NA 139   < > 142  --  143  --  142 142 142  K 6.1*   < > 4.7  --  5.3*  --  5.0 4.3 4.0  CL 105   < > 112*  --  113*  --  115* 117* 112*  CO2 20*   < > 19*  --  17*  --  18* 16* 18*  GLUCOSE 137*   < > 123*  --  99  --  122* 172* 192*  BUN 109*   < > 102*  --  94*  --  78* 61* 53*  CREATININE 6.52*   < > 6.12*  --  5.36*  --  4.43* 3.26* 2.69*  CALCIUM 9.4   < > 8.9   < > 8.7*   < > 7.9* 7.8* 8.1*  MG 2.8*  --   --   --   --   --   --   --   --   PHOS 6.5*  --   --   --   --   --   --   --   --    < > = values in this interval not displayed.    Liver Function Tests: Recent Labs  Lab 09/22/19 0039  AST 17  ALT 17  ALKPHOS 69  BILITOT 1.2  PROT 7.9  ALBUMIN 4.5   Recent Labs  Lab 09/22/19 0039  LIPASE 43   No results for input(s): AMMONIA in the last 168 hours.  CBC: Recent Labs  Lab 09/22/19 0039 09/22/19 0540 09/23/19 0444 09/24/19 0512  WBC 8.6 8.2 9.5 13.1*  NEUTROABS 6.0   --   --   --   HGB 13.7 12.3* 12.9* 11.7*  HCT 39.5 36.2* 36.9* 34.0*  MCV 95.6 97.8 95.6 96.3  PLT 215 173 164 149*    Cardiac Enzymes: No results for input(s): CKTOTAL, CKMB, CKMBINDEX, TROPONINI in the last 168 hours.  BNP: Invalid input(s): POCBNP  CBG: Recent Labs  Lab 09/23/19 2135 09/23/19 2204 09/24/19 0806 09/24/19 1138 09/24/19 1711  GLUCAP 62* 91 95 99 133*    Microbiology: Results for orders placed or performed during the hospital encounter of 09/22/19  Urine Culture     Status: None   Collection Time: 09/22/19 12:39 AM   Specimen: Urine, Clean Catch  Result Value Ref Range Status   Specimen Description   Final    URINE, CLEAN CATCH Performed at [facility], [address], [place], [place] [address]    Special Requests   Final    Normal Performed at [facility], [address] [place], [place] [address]    Culture   Final    NO GROWTH Performed at [facility], [place] [address] [place], [place] [address]    Report Status 09/22/2019 FINAL  Final  SARS Coronavirus 2 by RT PCR     Status: None   Collection Time: 09/22/19 12:39 AM  Result Value Ref Range Status   SARS Coronavirus 2 NEGATIVE NEGATIVE Final    Comment: (NOTE) Result indicates the ABSENCE of SARS-CoV-2 RNA in the patient specimen.  The lowest concentration of SARS-CoV-2 viral copies this assay can detect in nasopharyngeal swab specimens is 500 copies / mL.  A negative result does not preclude SARS-CoV-2 infection and should not be used as the sole basis for patient management decisions. A negative result may occur with improper specimen collection / handling, submission of a specimen other than nasopharyngeal swab, presence of viral mutation(s) within the areas targeted by this assay, and inadequate number of viral copies (<500 copies / mL) present.  Negative results must be combined with clinical observations, patient history, and epidemiological  information.  The expected result is NEGATIVE.  Patient Fact Sheet:  BlogSelections.co.uk   Provider Fact Sheet:  https://lucas.com/   This test is not yet approved or cleared by the [place] FDA and  has been authorized for  detection and/or diagnosis of SARS-CoV-2 by FDA under an Emergency Use Authorization (EUA).  This EUA will remain in effect (meaning this test can be used) for the duration of  the COVID-19 declaration under Section 564(b)(1) of the Act, 21 U.S.C. section 360bbb-3(b)(1), unless the authorization is terminated or revoked sooner Performed at [facility], [place] [address], [place], [place] [address]   CULTURE, BLOOD (ROUTINE X 2) w Reflex to ID Panel     Status: None (Preliminary result)   Collection Time: 09/24/19  3:39 PM   Specimen: BLOOD  Result Value Ref Range Status   Specimen Description BLOOD BLOOD LEFT HAND  Final   Special Requests   Final    BOTTLES DRAWN AEROBIC AND ANAEROBIC Blood Culture adequate volume   Culture   Final    NO GROWTH 2 DAYS Performed at [facility], [address] [place], [place] [address]    Report Status PENDING  Incomplete  CULTURE, BLOOD (ROUTINE X 2) w Reflex to ID Panel     Status: None (Preliminary result)   Collection Time: 09/24/19  3:47 PM   Specimen: BLOOD  Result Value Ref Range Status   Specimen Description BLOOD LEFT ANTECUBITAL  Final   Special Requests   Final    BOTTLES DRAWN AEROBIC AND ANAEROBIC Blood Culture results may not be optimal due to an excessive volume of blood received in culture bottles   Culture   Final    NO GROWTH 2 DAYS Performed at [facility], [address] [place], [place] [address]    Report Status PENDING  Incomplete    Coagulation Studies: No results for input(s): LABPROT, INR in the last 72 hours.  Urinalysis: No results for input(s): COLORURINE, LABSPEC, PHURINE, GLUCOSEU, HGBUR, BILIRUBINUR,  KETONESUR, PROTEINUR, UROBILINOGEN, NITRITE, LEUKOCYTESUR in the last 72 hours.  Invalid input(s): APPERANCEUR    Imaging: DG Chest Port 1 View  Result Date: 09/24/2019 CLINICAL DATA:  Fever, tachycardia. EXAM: PORTABLE CHEST 1 VIEW  COMPARISON:  July 19, 2017. FINDINGS: The heart size and mediastinal contours are within normal limits. No pneumothorax or pleural effusion is noted. Both lungs are clear. The visualized skeletal structures are unremarkable. IMPRESSION: No active disease. Aortic Atherosclerosis (ICD10-I70.0). Electronically Signed   By: Marijo Conception M.D.   On: 09/24/2019 15:47     Medications:     acetaminophen **OR** acetaminophen, diphenhydrAMINE, glycopyrrolate **OR** glycopyrrolate **OR** glycopyrrolate, ondansetron **OR** ondansetron (ZOFRAN) IV, polyvinyl alcohol  Assessment/ Plan:  86 y.o. male with past medical history of dementia, diabetes mellitus type 2, hypertension, history of CVA with residual left-sided weakness, aphasia now admitted with acute kidney injury, chronic kidney disease stage IIIa  1.  Acute kidney injury/chronic kidney disease stage IIIa baseline EGFR 55 from 09/10/2019.  Imaging upon admission negative for renal obstruction.  Patient was on lisinopril as an outpatient. -Creatinine down further to 2.6.  Renal function still worse than his baseline however.  Continue to monitor renal parameters as outpatient.  2.  Hyperkalemia.  Resolved with conservative measures.  Continue to monitor serum potassium.  3.  Anemia of chronic kidney disease.  Continue to monitor hemoglobin as an outpatient.   LOS: 4 Braison Snoke 4/19/20212:54 PM

## 2019-09-26 NOTE — Progress Notes (Signed)
PT Cancellation Note  Patient Details Name: AZAD CALAME MRN: [id number] DOB: 26-Dec-1932   Cancelled Treatment:    Reason Eval/Treat Not Completed: Other (comment). Pt currently transitioned to comfort care. Will dc current orders.    Erdine Hulen 09/26/2019, 8:18 AM Elizabeth Palau, PT, DPT [phone]

## 2019-09-26 NOTE — Care Management Important Message (Signed)
Important Message  Patient Details  Name: Jerry Barrett MRN: [id number] Date of Birth: 1932/09/26   Medicare Important Message Given:  Yes     Olegario Messier A Katelyn Broadnax 09/26/2019, 11:08 AM

## 2019-09-26 NOTE — Progress Notes (Signed)
Patient is alert this shift, verbal at times, when this writer asked " How are you?" , Patient verbalized " I am good I ate a banana sandwich"! , Wife and daughter at bedside and report his condition much improved from yesterday

## 2019-09-26 NOTE — Progress Notes (Signed)
[place] at [facility]   PATIENT NAME: Jerry Barrett    MR#:  [id number]  DATE OF BIRTH:  08-Dec-1932  SUBJECTIVE:  CHIEF COMPLAINT:   Chief Complaint  Patient presents with  . Abnormal Lab  Much more alert today.  Talkative.  Family now would like to keep him home under palliative care and not hospice REVIEW OF SYSTEMS:  Review of Systems  Unable to perform ROS: Acuity of condition   DRUG ALLERGIES:  No Known Allergies VITALS:  Blood pressure (!) 165/117, pulse (!) 104, temperature 98.2 F (36.8 C), temperature source Oral, resp. rate 16, height 6' (1.829 m), weight 83.9 kg, SpO2 99 %. PHYSICAL EXAMINATION:  Physical Exam HENT:     Head: Normocephalic and atraumatic.  Eyes:     Conjunctiva/sclera: Conjunctivae normal.     Pupils: Pupils are equal, round, and reactive to light.  Neck:     Thyroid: No thyromegaly.     Trachea: No tracheal deviation.  Cardiovascular:     Rate and Rhythm: Normal rate and regular rhythm.     Heart sounds: Normal heart sounds.  Pulmonary:     Effort: Pulmonary effort is normal. No respiratory distress.     Breath sounds: Normal breath sounds. No wheezing.  Chest:     Chest wall: No tenderness.  Abdominal:     General: Bowel sounds are normal. There is no distension.     Palpations: Abdomen is soft.     Tenderness: There is no abdominal tenderness.  Musculoskeletal:        General: Normal range of motion.     Cervical back: Normal range of motion and neck supple.  Skin:    General: Skin is warm and dry.     Findings: No rash.  Neurological:     Mental Status: He is alert. He is confused.     Cranial Nerves: No cranial nerve deficit.    LABORATORY PANEL:  Male CBC Recent Labs  Lab 09/24/19 0512  WBC 13.1*  HGB 11.7*  HCT 34.0*  PLT 149*   ------------------------------------------------------------------------------------------------------------------ Chemistries  Recent Labs  Lab 09/22/19 0039 09/22/19 0540  09/26/19 0938  NA 139   < > 142  K 6.1*   < > 4.0  CL 105   < > 112*  CO2 20*   < > 18*  GLUCOSE 137*   < > 192*  BUN 109*   < > 53*  CREATININE 6.52*   < > 2.69*  CALCIUM 9.4   < > 8.1*  MG 2.8*  --   --   AST 17  --   --   ALT 17  --   --   ALKPHOS 69  --   --   BILITOT 1.2  --   --    < > = values in this interval not displayed.   RADIOLOGY:  No results found. ASSESSMENT AND PLAN:   Sepsis - ruled out Last 24 hours patient has been afebrile -Negative UA and chest x-ray, 2 sets of blood culture so far not growing anything -Likely viral in nature  AKI (acute kidney injury) (HCC) Hyperkalemia -Likely prerenal, related to decreased oral intake. No reports of nausea vomiting.Urinalysis negative -Wife shows report from [facility] primary - concerning for protein in the urine in the lab in January 2021 -CT abdomen and pelvis neg for obstruction -CT head neg for acute pathology -improving with IV hydration. 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  They are leaning towards keeping him comfortable and have palliative care instead of hospice follow at home.  They understand overall poor prognosis and would like to keep him comfortable in the home setting.  Niece is working on setting up private caregivers at home.  Likely discharge Tuesday   Need home health PT OT RN nursing aide in hospital bed at discharge along with palliative care  Status is: Inpatient  Remains inpatient appropriate because:IV treatments appropriate due to intensity of illness or inability to take PO   Dispo: The  patient is from: SNF              Anticipated d/c is to: Home              Anticipated d/c date is: 1 day              Patient currently is not medically stable to d/c.   DVT prophylaxis: Comfort care Family Communication: updated wife and niece at bedside   All the records are reviewed and case discussed with Care Management/Social Worker. Management plans discussed with the patient, family and they are in agreement.  CODE STATUS: DNR  TOTAL TIME TAKING CARE OF THIS PATIENT: 35 minutes.   More than 50% of the time was spent in counseling/coordination of care: YES  POSSIBLE D/C IN 1 DAYS, DEPENDING ON CLINICAL CONDITION.   Max Sane M.D on 09/26/2019 at 4:41 PM  [facility]   CC: Primary care physician; [facility]  Note: This dictation was prepared with Dragon dictation along with smaller phrase technology. Any transcriptional errors that result from this process are unintentional.

## 2019-09-26 NOTE — Progress Notes (Cosign Needed)
Patient spends 85 % of the time in bed. Weakness, confusion and history of stroke causes frequent choking and gagging. Torso required to be elevated at least 30% degrees to prevent gagging and choking and aspiration. Bed wedges do not provide adequate elevation to resolve issues with aspiration. This condition requires frequesnt changes in body position which cannot be achieved with a normal bed.

## 2019-09-26 NOTE — Progress Notes (Signed)
Patient with uneventful night. Rested comfortably in bed although patient remained awake during the shift. Comfort care in place. Patient offered drink but refused. Low bed remains in use.

## 2019-09-27 DIAGNOSIS — E869 Volume depletion, unspecified: Secondary | ICD-10-CM

## 2019-09-27 LAB — BASIC METABOLIC PANEL
Anion gap: 6 (ref 5–15)
BUN: 49 mg/dL — ABNORMAL HIGH (ref 8–23)
CO2: 23 mmol/L (ref 22–32)
Calcium: 8.2 mg/dL — ABNORMAL LOW (ref 8.9–10.3)
Chloride: 117 mmol/L — ABNORMAL HIGH (ref 98–111)
Creatinine, Ser: 2.58 mg/dL — ABNORMAL HIGH (ref 0.61–1.24)
GFR calc Af Amer: 25 mL/min — ABNORMAL LOW (ref >60)
GFR calc non Af Amer: 22 mL/min — ABNORMAL LOW (ref >60)
Glucose, Bld: 151 mg/dL — ABNORMAL HIGH (ref 70–99)
Potassium: 3.9 mmol/L (ref 3.5–5.1)
Sodium: 146 mmol/L — ABNORMAL HIGH (ref 135–145)

## 2019-09-27 LAB — CBC
HCT: 36 % — ABNORMAL LOW (ref 39.0–52.0)
Hemoglobin: 12.6 g/dL — ABNORMAL LOW (ref 13.0–17.0)
MCH: 33.2 pg (ref 26.0–34.0)
MCHC: 35 g/dL (ref 30.0–36.0)
MCV: 94.7 fL (ref 80.0–100.0)
Platelets: 179 10*3/uL (ref 150–400)
RBC: 3.8 MIL/uL — ABNORMAL LOW (ref 4.22–5.81)
RDW: 13.5 % (ref 11.5–15.5)
WBC: 9.3 10*3/uL (ref 4.0–10.5)
nRBC: 0 % (ref 0.0–0.2)

## 2019-09-27 NOTE — TOC Progression Note (Addendum)
Transition of Care [facility]) - Progression Note    Patient Details  Name: Jerry Barrett MRN: [id number] Date of Birth: [birth year]  Transition of [facility]) CM/SW Contact  Barrie Dunker, RN Phone Number: 09/27/2019, 9:46 AM  Clinical Narrative:    Received a call from Junious Dresser the patient's niece, she was asking what time the patient will be coming home today, I explained the order has not been written yet but I would let her know when it is, He will need EMS transport home, He will have a Rolling Walker a Bedside commode and a hospital bed, they will be delivered to the home, awaiting orders and will call the family back   Expected Discharge Plan: Home w Home Health Services Barriers to Discharge: Continued Medical Work up  Expected Discharge Plan and Services Expected Discharge Plan: Home w Home Health Services   Discharge Planning Services: CM Consult Post Acute Care Choice: Home Health Living arrangements for the past 2 months: Single Family Home                 DME Arranged: 3-N-1, Walker rolling DME Agency: [facility] Date DME Agency Contacted: 09/26/19 Time DME Agency Contacted: [phone] Representative spoke with at DME Agency: Nida Boatman HH Arranged: PT, OT, Nurse's Aide, Social Work [employer] Agency: [facility] Date HH Agency Contacted: 09/26/19 Time HH Agency Contacted: [phone] Representative spoke with at [facility] Agency: Barbara Cower   Social Determinants of Health (SDOH) Interventions    Readmission Risk Interventions No flowsheet data found.

## 2019-09-27 NOTE — TOC Transition Note (Addendum)
Transition of Care [facility]) - CM/SW Discharge Note   Patient Details  Name: Jerry Barrett MRN: [id number] Date of Birth: November 10, 1932  Transition of Care [facility]) CM/SW Contact:  Barrie Dunker, RN Phone Number: 09/27/2019, 10:08 AM   Clinical Narrative:    Patient to DC home with EMS for transport, Spoke with Junious Dresser the niece she is there awating him coming home, [facility] to see patient at Home, DC papers on the chart. RNCM called EMS and bedside nurse aware, patient ready   Final next level of care: Home w Home Health Services Barriers to Discharge: Barriers Resolved   Patient Goals and CMS Choice Patient states their goals for this hospitalization and ongoing recovery are:: go home CMS Medicare.gov Compare Post Acute Care list provided to:: Patient Choice offered to / list presented to : Patient, Spouse  Discharge Placement                       Discharge Plan and Services   Discharge Planning Services: CM Consult Post Acute Care Choice: Home Health          DME Arranged: 3-N-1, Walker rolling DME Agency: [facility] Date DME Agency Contacted: 09/26/19 Time DME Agency Contacted: [phone] Representative spoke with at DME Agency: Nida Boatman HH Arranged: PT, OT, Nurse's Aide, Social Work [employer] Agency: [facility] Date HH Agency Contacted: 09/26/19 Time HH Agency Contacted: [phone] Representative spoke with at [facility] Agency: Barbara Cower  Social Determinants of Health (SDOH) Interventions     Readmission Risk Interventions No flowsheet data found.

## 2019-09-27 NOTE — Progress Notes (Signed)
[facility]  ROUNDING NOTE   Subjective:  Renal function slightly better today as BUN down to 49 with a creatinine of 2.5. Sitting up in bed and resting comfortably.  Objective:  Vital signs in last 24 hours:  Temp:  [97.7 F (36.5 C)-98.6 F (37 C)] 97.7 F (36.5 C) (04/20 1032) Pulse Rate:  [67-74] 74 (04/20 1032) Resp:  [16] 16 (04/20 0030) BP: (147-175)/(77-89) 147/77 (04/20 1032) SpO2:  [96 %-100 %] 96 % (04/20 1032)  Weight change:  Filed Weights   09/22/19 0026  Weight: 83.9 kg    Intake/Output: I/O last 3 completed shifts: In: 240 [P.O.:240] Out: 1610 [Urine:1245]   Intake/Output this shift:  Total I/O In: 120 [P.O.:120] Out: -   Physical Exam: General: No acute distress  Head: Normocephalic, atraumatic. Moist oral mucosal membranes  Eyes: Anicteric  Neck: Supple, trachea midline  Lungs:  Clear to auscultation, normal effort  Heart: S1S2 no rubs  Abdomen:  Soft, nontender, bowel sounds present  Extremities: No peripheral edema.  Neurologic: Awake, alert, following commands  Skin: No acute rash       Basic Metabolic Panel: Recent Labs  Lab 09/22/19 0039 09/22/19 0540 09/23/19 0444 09/23/19 0444 09/24/19 0512 09/24/19 0512 09/25/19 0944 09/26/19 0938 09/27/19 0409  NA 139   < > 143  --  142  --  142 142 146*  K 6.1*   < > 5.3*  --  5.0  --  4.3 4.0 3.9  CL 105   < > 113*  --  115*  --  117* 112* 117*  CO2 20*   < > 17*  --  18*  --  16* 18* 23  GLUCOSE 137*   < > 99  --  122*  --  172* 192* 151*  BUN 109*   < > 94*  --  78*  --  61* 53* 49*  CREATININE 6.52*   < > 5.36*  --  4.43*  --  3.26* 2.69* 2.58*  CALCIUM 9.4   < > 8.7*   < > 7.9*   < > 7.8* 8.1* 8.2*  MG 2.8*  --   --   --   --   --   --   --   --   PHOS 6.5*  --   --   --   --   --   --   --   --    < > = values in this interval not displayed.    Liver Function Tests: Recent Labs  Lab 09/22/19 0039  AST 17  ALT 17  ALKPHOS 69  BILITOT 1.2  PROT 7.9  ALBUMIN  4.5   Recent Labs  Lab 09/22/19 0039  LIPASE 43   No results for input(s): AMMONIA in the last 168 hours.  CBC: Recent Labs  Lab 09/22/19 0039 09/22/19 0540 09/23/19 0444 09/24/19 0512 09/27/19 0409  WBC 8.6 8.2 9.5 13.1* 9.3  NEUTROABS 6.0  --   --   --   --   HGB 13.7 12.3* 12.9* 11.7* 12.6*  HCT 39.5 36.2* 36.9* 34.0* 36.0*  MCV 95.6 97.8 95.6 96.3 94.7  PLT 215 173 164 149* 179    Cardiac Enzymes: No results for input(s): CKTOTAL, CKMB, CKMBINDEX, TROPONINI in the last 168 hours.  BNP: Invalid input(s): POCBNP  CBG: Recent Labs  Lab 09/23/19 2135 09/23/19 2204 09/24/19 0806 09/24/19 1138 09/24/19 1711  GLUCAP 62* 91 95 99 133*    Microbiology: Results  for orders placed or performed during the hospital encounter of 09/22/19  Urine Culture     Status: None   Collection Time: 09/22/19 12:39 AM   Specimen: Urine, Clean Catch  Result Value Ref Range Status   Specimen Description   Final    URINE, CLEAN CATCH Performed at [facility], [address] [place], [place] [address]    Special Requests   Final    Normal Performed at [facility], [address] [place], [place] [address]    Culture   Final    NO GROWTH Performed at [facility], [place] [address] [place], [place] [address]    Report Status 09/22/2019 FINAL  Final  SARS Coronavirus 2 by RT PCR     Status: None   Collection Time: 09/22/19 12:39 AM  Result Value Ref Range Status   SARS Coronavirus 2 NEGATIVE NEGATIVE Final    Comment: (NOTE) Result indicates the ABSENCE of SARS-CoV-2 RNA in the patient specimen.  The lowest concentration of SARS-CoV-2 viral copies this assay can detect in nasopharyngeal swab specimens is 500 copies / mL.  A negative result does not preclude SARS-CoV-2 infection and should not be used as the sole basis for patient management decisions. A negative result may occur with improper specimen collection / handling, submission of a  specimen other than nasopharyngeal swab, presence of viral mutation(s) within the areas targeted by this assay, and inadequate number of viral copies (<500 copies / mL) present.  Negative results must be combined with clinical observations, patient history, and epidemiological information.  The expected result is NEGATIVE.  Patient Fact Sheet:  BlogSelections.co.uk   Provider Fact Sheet:  https://lucas.com/   This test is not yet approved or cleared by the [place] FDA and  has been authorized for  detection and/or diagnosis of SARS-CoV-2 by FDA under an Emergency Use Authorization (EUA).  This EUA will remain in effect (meaning this test can be used) for the duration of  the COVID-19 declaration under Section 564(b)(1) of the Act, 21 U.S.C. section 360bbb-3(b)(1), unless the authorization is terminated or revoked sooner Performed at [facility], [place] [address] [place], [place] [address]   CULTURE, BLOOD (ROUTINE X 2) w Reflex to ID Panel     Status: None (Preliminary result)   Collection Time: 09/24/19  3:39 PM   Specimen: BLOOD  Result Value Ref Range Status   Specimen Description BLOOD BLOOD LEFT HAND  Final   Special Requests   Final    BOTTLES DRAWN AEROBIC AND ANAEROBIC Blood Culture adequate volume   Culture   Final    NO GROWTH 3 DAYS Performed at [facility], [address] [place], [place] [address]    Report Status PENDING  Incomplete  CULTURE, BLOOD (ROUTINE X 2) w Reflex to ID Panel     Status: None (Preliminary result)   Collection Time: 09/24/19  3:47 PM   Specimen: BLOOD  Result Value Ref Range Status   Specimen Description BLOOD LEFT ANTECUBITAL  Final   Special Requests   Final    BOTTLES DRAWN AEROBIC AND ANAEROBIC Blood Culture results may not be optimal due to an excessive volume of blood received in culture bottles   Culture   Final    NO GROWTH 3 DAYS Performed at [facility], [address], [place], [place] [address]    Report Status PENDING  Incomplete    Coagulation Studies: No results for input(s): LABPROT,  INR in the last 72 hours.  Urinalysis: No results for input(s): COLORURINE, LABSPEC, PHURINE, GLUCOSEU, HGBUR, BILIRUBINUR, KETONESUR, PROTEINUR, UROBILINOGEN, NITRITE, LEUKOCYTESUR in the last 72 hours.  Invalid input(s): APPERANCEUR    Imaging: No results found.   Medications:     acetaminophen **OR** acetaminophen, diphenhydrAMINE, glycopyrrolate **OR** glycopyrrolate **OR** glycopyrrolate, ondansetron **OR** ondansetron (ZOFRAN) IV, polyvinyl alcohol  Assessment/ Plan:  86 y.o. male with past medical history of dementia, diabetes mellitus type 2, hypertension, history of CVA with residual left-sided weakness, aphasia now admitted with acute kidney injury, chronic kidney disease stage IIIa  1.  Acute kidney injury/chronic kidney disease stage IIIa baseline EGFR 55 from 09/10/2019.  Imaging upon admission negative for renal obstruction.  Patient was on lisinopril as an outpatient. -Renal function slowly improving.  BUN down to 49 with a creatinine 2.5.  Encourage the patient to maintain adequate fluid hydration status.  2.  Hyperkalemia.  Potassium now within acceptable range at 3.9.  3.  Anemia of chronic kidney disease.  Hemoglobin currently up to 12.6.  No indication for Procrit.   LOS: 5 Jerry Barrett 4/20/202111:58 AM

## 2019-09-27 NOTE — Discharge Instructions (Signed)

## 2019-09-28 NOTE — Discharge Summary (Signed)
[facility] at [facility]   PATIENT NAME: Jerry Barrett    MR#:  [id number]  DATE OF BIRTH:  16-Nov-1932  DATE OF ADMISSION:  09/22/2019   ADMITTING PHYSICIAN: Andris Baumann, MD  DATE OF DISCHARGE: 09/27/2019  1:45 PM  PRIMARY CARE PHYSICIAN: [place]   ADMISSION DIAGNOSIS:  Hyperkalemia [E87.5] Hypermagnesemia [E83.41] Hyperphosphatemia [E83.39] DNR (do not resuscitate) [Z66] AKI (acute kidney injury) (HCC) [N17.9] Volume depletion [E86.9] Acute renal failure, unspecified acute renal failure type (HCC) [N17.9] Dementia without behavioral disturbance, unspecified dementia type (HCC) [F03.90] DISCHARGE DIAGNOSIS:  Principal Problem:   Acute renal failure (HCC) Active Problems:   Diabetes (HCC)   HTN (hypertension)   Hypothyroidism   History of CVA with residual deficit   Hemiparesis and other late effects of cerebrovascular accident (HCC)   Hyperkalemia   Goals of care, counseling/discussion   Palliative care by specialist   Volume depletion  SECONDARY DIAGNOSIS:   Past Medical History:  Diagnosis Date  . Dementia (HCC)   . Diabetes mellitus without complication (HCC)   . Hyperglycemia   . Hypertension   . Thyroid disease    HOSPITAL COURSE:   Sepsis - ruled out AKI (acute kidney injury) (HCC) - improved with hydration. Likely prerenal, creat 2.58 at D/C Hyperkalemia - resolved Diabetes (HCC)-hold off Metformin due to acute kidney failure HTN (hypertension) -stop lisinopril due to acute renal failure Hypothyroidism -continue levothyroxine History of CVA on3/26/21 treated withtPA with complication of small intraparenchymal bleed Hemiparesis and other late effects of cerebrovascular accident (HCC)   overall very poor prognosis. Family agreeable for keeping him comfortable. outpt palliative care with consideration of transition to Hospice if and when appropriate. DISCHARGE CONDITIONS:  fair CONSULTS OBTAINED:   DRUG ALLERGIES:    No Known Allergies DISCHARGE MEDICATIONS:   Allergies as of 09/27/2019   No Known Allergies     Medication List    STOP taking these medications   acetaminophen 325 MG tablet Commonly known as: TYLENOL   aspirin EC 81 MG tablet   atorvastatin 20 MG tablet Commonly known as: LIPITOR   bisacodyl 10 MG suppository Commonly known as: DULCOLAX   doxycycline 100 MG capsule Commonly known as: VIBRAMYCIN   enoxaparin 40 MG/0.4ML injection Commonly known as: LOVENOX   Glucagon HCl 1 MG Solr   HumaLOG KwikPen 100 UNIT/ML KwikPen Generic drug: insulin lispro   lisinopril 10 MG tablet Commonly known as: ZESTRIL   lovastatin 40 MG tablet Commonly known as: MEVACOR   melatonin 3 MG Tabs tablet   metFORMIN 500 MG tablet Commonly known as: GLUCOPHAGE   ondansetron 4 MG tablet Commonly known as: ZOFRAN   sennosides-docusate sodium 8.6-50 MG tablet Commonly known as: SENOKOT-S   sildenafil 100 MG tablet Commonly known as: VIAGRA     TAKE these medications   levothyroxine 50 MCG tablet Commonly known as: SYNTHROID Take 50 mcg by mouth daily before breakfast.      DISCHARGE INSTRUCTIONS:   DIET:  Regular diet DISCHARGE CONDITION:  Fair ACTIVITY:  Activity as tolerated OXYGEN:  Home Oxygen: No.  Oxygen Delivery: room air DISCHARGE LOCATION:  Home with HHPT, OT, RN, N.aid. and Palliative care to follow   If you experience worsening of your admission symptoms, develop shortness of breath, life threatening emergency, suicidal or homicidal thoughts you must seek medical attention immediately by calling 911 or calling your MD immediately  if symptoms less severe.  You Must read complete instructions/literature along with all the possible adverse  reactions/side effects for all the Medicines you take and that have been prescribed to you. Take any new Medicines after you have completely understood and accpet all the possible adverse reactions/side effects.   Please  note  You were cared for by a hospitalist during your hospital stay. If you have any questions about your discharge medications or the care you received while you were in the hospital after you are discharged, you can call the unit and asked to speak with the hospitalist on call if the hospitalist that took care of you is not available. Once you are discharged, your primary care physician will handle any further medical issues. Please note that NO REFILLS for any discharge medications will be authorized once you are discharged, as it is imperative that you return to your primary care physician (or establish a relationship with a primary care physician if you do not have one) for your aftercare needs so that they can reassess your need for medications and monitor your lab values.    On the day of Discharge:  VITAL SIGNS:  Blood pressure (!) 147/77, pulse 74, temperature 97.7 F (36.5 C), temperature source Oral, resp. rate 16, height 6' (1.829 m), weight 83.9 kg, SpO2 96 %. PHYSICAL EXAMINATION:  GENERAL:  86 y.o.-year-old patient lying in the bed with no acute distress.  EYES: Pupils equal, round, reactive to light and accommodation. No scleral icterus. Extraocular muscles intact.  HEENT: Head atraumatic, normocephalic. Oropharynx and nasopharynx clear.  NECK:  Supple, no jugular venous distention. No thyroid enlargement, no tenderness.  LUNGS: Normal breath sounds bilaterally, no wheezing, rales,rhonchi or crepitation. No use of accessory muscles of respiration.  CARDIOVASCULAR: S1, S2 normal. No murmurs, rubs, or gallops.  ABDOMEN: Soft, non-tender, non-distended. Bowel sounds present. No organomegaly or mass.  EXTREMITIES: No pedal edema, cyanosis, or clubbing.  NEUROLOGIC: Cranial nerves II through XII are intact. Muscle strength 5/5 in all extremities. Sensation intact. Gait not checked.  PSYCHIATRIC: The patient is alert   SKIN: No obvious rash, lesion, or ulcer.  DATA REVIEW:    CBC Recent Labs  Lab 09/27/19 0409  WBC 9.3  HGB 12.6*  HCT 36.0*  PLT 179    Chemistries  Recent Labs  Lab 09/22/19 0039 09/22/19 0540 09/27/19 0409  NA 139   < > 146*  K 6.1*   < > 3.9  CL 105   < > 117*  CO2 20*   < > 23  GLUCOSE 137*   < > 151*  BUN 109*   < > 49*  CREATININE 6.52*   < > 2.58*  CALCIUM 9.4   < > 8.2*  MG 2.8*  --   --   AST 17  --   --   ALT 17  --   --   ALKPHOS 69  --   --   BILITOT 1.2  --   --    < > = values in this interval not displayed.     Outpatient follow-up Follow-up Information    [place]. Go on 10/13/2019.   Why: at 11:00 am Contact information: [address] [place] [address] [phone]            Management plans discussed with the patient, family and they are in agreement.  CODE STATUS: DNR  TOTAL TIME TAKING CARE OF THIS PATIENT: 45 minutes.    Delfino Lovett M.D on 09/28/2019 at 3:50 PM  [facility]   CC: Primary care physician;  [place]   Note: This dictation was prepared with Dragon dictation along with smaller phrase technology. Any transcriptional errors that result from this process are unintentional.

## 2019-09-29 LAB — CULTURE, BLOOD (ROUTINE X 2)
Culture: NO GROWTH
Culture: NO GROWTH
Special Requests: ADEQUATE

## 2019-10-06 ENCOUNTER — Telehealth: Payer: Self-pay | Admitting: Primary Care

## 2019-10-06 NOTE — Telephone Encounter (Signed)
Called patient to schedule the Palliative Consult, no answer - left message with reason for call along with my name and contact number.  I also spoke with patient's niece, Katheran Awe (listed contact) and talked with her about Palliative services.  She lives out of town and is planning on coming in to go to MD appointment with patient that is scheduled on 10/13/19 with Dr. Greggory Stallion.  She will discuss Palliative services with patient and MD at that time to make sure that patient understands and is in agreement with this.  I have also emailed Junious Dresser a Palliative brochure for her to review and share with patient.  She will contact the end of next to let me know what patient has decided to do.

## 2019-10-18 ENCOUNTER — Telehealth: Payer: Self-pay | Admitting: Primary Care

## 2019-10-18 NOTE — Telephone Encounter (Signed)
Called patient's daughter Junious Dresser, to f/u with her to see if she discussed Palliative services with the patient and to see if he was in agreement with this, no answer - left message with my name and contact number.

## 2019-10-20 ENCOUNTER — Emergency Department: Payer: Medicare Other

## 2019-10-20 ENCOUNTER — Emergency Department
Admission: EM | Admit: 2019-10-20 | Discharge: 2019-10-21 | Disposition: A | Discharge status: Home / Self Care | Payer: Medicare Other | Attending: Emergency Medicine | Admitting: Emergency Medicine

## 2019-10-20 DIAGNOSIS — I69359 Hemiplegia and hemiparesis following cerebral infarction affecting unspecified side: Secondary | ICD-10-CM | POA: Diagnosis not present

## 2019-10-20 DIAGNOSIS — F0390 Unspecified dementia without behavioral disturbance: Secondary | ICD-10-CM | POA: Diagnosis not present

## 2019-10-20 DIAGNOSIS — E039 Hypothyroidism, unspecified: Secondary | ICD-10-CM | POA: Insufficient documentation

## 2019-10-20 DIAGNOSIS — I10 Essential (primary) hypertension: Secondary | ICD-10-CM | POA: Diagnosis not present

## 2019-10-20 DIAGNOSIS — N3000 Acute cystitis without hematuria: Secondary | ICD-10-CM | POA: Insufficient documentation

## 2019-10-20 DIAGNOSIS — Z79899 Other long term (current) drug therapy: Secondary | ICD-10-CM | POA: Diagnosis not present

## 2019-10-20 DIAGNOSIS — E119 Type 2 diabetes mellitus without complications: Secondary | ICD-10-CM | POA: Diagnosis not present

## 2019-10-20 DIAGNOSIS — R4182 Altered mental status, unspecified: Secondary | ICD-10-CM | POA: Insufficient documentation

## 2019-10-20 NOTE — ED Provider Notes (Signed)
[facility] Emergency Department Provider Note  ____________________________________________  Time seen: Approximately 11:37 PM  I have reviewed the triage vital signs and the nursing notes.   HISTORY  Chief Complaint Altered Mental Status  Level 5 caveat:  Portions of the history and physical were unable to be obtained due to dementia   HPI Jerry Barrett is a 86 y.o. male with a history of dementia, diabetes, hypertension, hyperlipidemia, recent stroke in March 2021 status post TPA complicated by  small intraparenchymal bleed who presents for evaluation of altered mental status.  According to the wife, patient was sleeping this evening and she could not tell if he was breathing or if he had a pulse. She tried to wake him up unsuccessfully which made her call 911. She is concerned because they took him off of metformin during last admission for AKI and he has not been on anything for DM. She was able to speak with his doctor yesterday and they were supposed to send glipizide to the pharmacy but it was not there yesterday. She will call the office again in the morning.  No fever, cough, vomiting, or diarrhea. Patient is able to say his whole name and knows he is in the hospital.  He is not sure why he is here.  He denies headache, chest pain, abdominal pain, shortness of breath.  Patient will answer most questions by moving his head yes or no.  Past Medical History:  Diagnosis Date  . Dementia (HCC)   . Diabetes mellitus without complication (HCC)   . Hyperglycemia   . Hypertension   . Thyroid disease     Patient Active Problem List   Diagnosis Date Noted  . Volume depletion   . Goals of care, counseling/discussion   . Palliative care by specialist   . Acute renal failure (HCC) 09/22/2019  . History of CVA with residual deficit 09/22/2019  . Hemiparesis and other late effects of cerebrovascular accident (HCC) 09/22/2019  . Hyperkalemia 09/22/2019  .  SIRS (systemic inflammatory response syndrome) (HCC) 07/19/2017  . Influenza A 07/19/2017  . Diabetes (HCC) 07/19/2017  . HTN (hypertension) 07/19/2017  . Hypothyroidism 07/19/2017  . HLD (hyperlipidemia) 07/19/2017    Past Surgical History:  Procedure Laterality Date  . EYE SURGERY      Prior to Admission medications   Medication Sig Start Date End Date Taking? Authorizing Provider  cephALEXin (KEFLEX) 500 MG capsule Take 1 capsule (500 mg total) by mouth 3 (three) times daily for 7 days. 10/21/19 10/28/19  Nita Sickle, MD  levothyroxine (SYNTHROID, LEVOTHROID) 50 MCG tablet Take 50 mcg by mouth daily before breakfast.    [provider]    Allergies Patient has no known allergies.  History reviewed. No pertinent family history.  Social History Social History   Tobacco Use  . Smoking status: Never Smoker  . Smokeless tobacco: Never Used  Substance Use Topics  . Alcohol use: No  . Drug use: Never    Review of Systems  Constitutional: Negative for fever. + AMS Eyes: Negative for visual changes. ENT: Negative for sore throat. Neck: No neck pain  Cardiovascular: Negative for chest pain. Respiratory: Negative for shortness of breath. Gastrointestinal: Negative for abdominal pain, vomiting or diarrhea. Genitourinary: Negative for dysuria. Musculoskeletal: Negative for back pain. Skin: Negative for rash. Neurological: Negative for headaches, weakness or numbness. Psych: No SI or HI  ____________________________________________   PHYSICAL EXAM:  VITAL SIGNS: ED Triage Vitals  Enc Vitals Group  BP 10/20/19 2301 (!) 153/77     Pulse Rate 10/20/19 2301 72     Resp 10/20/19 2301 15     Temp 10/20/19 2301 98.1 F (36.7 C)     Temp Source 10/20/19 2301 Oral     SpO2 10/20/19 2301 98 %     Weight 10/20/19 2310 176 lb 5.9 oz (80 kg)     Height 10/20/19 2310 6' (1.829 m)     Head Circumference --      Peak Flow --      Pain Score 10/20/19 2310 0       Pain Loc --      Pain Edu? --      Excl. in GC? --     Constitutional: Alert and oriented x 2, no apparent distress. HEENT:      Head: Normocephalic and atraumatic.         Eyes: Conjunctivae are normal. Sclera is non-icteric.       Mouth/Throat: Mucous membranes are moist.       Neck: Supple with no signs of meningismus. Cardiovascular: Regular rate and rhythm. No murmurs, gallops, or rubs.  Respiratory: Normal respiratory effort. Lungs are clear to auscultation bilaterally.  Gastrointestinal: Soft, non tender. Musculoskeletal: No edema, cyanosis, or erythema of extremities. Neurologic: Face is symmetric. Moving all extremities. No gross focal neurologic deficits are appreciated. Skin: Skin is warm, dry and intact. No rash noted. Psychiatric: Mood and affect are normal. Speech and behavior are normal.  ____________________________________________   LABS (all labs ordered are listed, but only abnormal results are displayed)  Labs Reviewed  CBC WITH DIFFERENTIAL/PLATELET - Abnormal; Notable for the following components:      Result Value   RBC 3.85 (*)    Hemoglobin 12.5 (*)    HCT 36.6 (*)    All other components within normal limits  COMPREHENSIVE METABOLIC PANEL - Abnormal; Notable for the following components:   Glucose, Bld 199 (*)    BUN 25 (*)    Creatinine, Ser 1.83 (*)    Calcium 8.8 (*)    GFR calc non Af Amer 33 (*)    GFR calc Af Amer 38 (*)    All other components within normal limits  URINALYSIS, COMPLETE (UACMP) WITH MICROSCOPIC - Abnormal; Notable for the following components:   Color, Urine YELLOW (*)    APPearance CLOUDY (*)    Glucose, UA 50 (*)    Leukocytes,Ua SMALL (*)    WBC, UA >50 (*)    Bacteria, UA MANY (*)    All other components within normal limits  URINE CULTURE   ____________________________________________  EKG  ED ECG REPORT I, Nita Sickle, the attending physician, personally viewed and interpreted this ECG.  Normal  sinus rhythm, rate of 66, first-degree AV block, normal QTC, right bundle branch block, no ST elevations or depressions.  Unchanged from prior. ____________________________________________  RADIOLOGY  I have personally reviewed the images performed during this visit and I agree with the Radiologist's read.   Interpretation by Radiologist:  CT Head Wo Contrast  Result Date: 10/20/2019 CLINICAL DATA:  Altered mental status EXAM: CT HEAD WITHOUT CONTRAST TECHNIQUE: Contiguous axial images were obtained from the base of the skull through the vertex without intravenous contrast. COMPARISON:  09/22/2019 FINDINGS: Brain: Mild atrophic changes and chronic white matter ischemic changes are seen. No findings to suggest acute hemorrhage, acute infarction or space-occupying mass lesion are noted. Vascular: No hyperdense vessel or unexpected calcification. Skull: Normal. Negative for fracture  or focal lesion. Sinuses/Orbits: No acute finding. Other: None. IMPRESSION: Mild atrophic and chronic white matter ischemic changes. No acute abnormality noted. Electronically Signed   By: Alcide Clever M.D.   On: 10/20/2019 23:29     ____________________________________________   PROCEDURES  Procedure(s) performed:yes .1-3 Lead EKG Interpretation Performed by: Nita Sickle, MD Authorized by: Nita Sickle, MD     Interpretation: normal     ECG rate assessment: normal     Rhythm: sinus rhythm     Ectopy: none     Critical Care performed:  None ____________________________________________   INITIAL IMPRESSION / ASSESSMENT AND PLAN / ED COURSE   86 y.o. male with a history of dementia, diabetes, hypertension, hyperlipidemia, recent stroke in March 2021 status post TPA complicated by  small intraparenchymal bleed who presents for evaluation of altered mental status.  Patient does not really speak much.  Is able to tell me his name and that he is in the hospital.  He is not sure why he is here.   Will answer most questions yes or no by shaking his head.  Denies any pain at this time.  Vitals are within normal limits.  Strength is intact x4, face is symmetric, speech does not sound slurred.  EKG showing no signs of acute ischemic changes or dysrhythmias.  Patient placed on telemetry for close monitoring.  Old medical records reviewed.  History gathered mostly from wife.   Ddx delirium, infection, intracranial bleed, dehydration, electrolyte derangements, AKI, stroke  Plan for labs, UA, repeat head CT.  _________________________ 2:20 AM on 10/21/2019 -----------------------------------------  Patient back to baseline per wife. Remains stable and with no complaints. Head CT visualized by me showing no acute intracranial abnormality which is confirmed by radiology read.  Labs showing UA positive for UTI.  Review of epic show no prior positive urine cultures in the past therefore will treat with Keflex and send for culture.  No signs of sepsis with no fever, tachycardia, or leukocytosis.  Labs also showing mild stable anemia, mild leukocytosis with no DKA, creatinine is improved significantly.  At this time since patient is back to baseline and work-up is reassuring but will discharge him home on Keflex with close follow-up with PCP.  Wife is comfortable with this plan.       _____________________________________________ Please note:  Patient was evaluated in Emergency Department today for the symptoms described in the history of present illness. Patient was evaluated in the context of the global COVID-19 pandemic, which necessitated consideration that the patient might be at risk for infection with the SARS-CoV-2 virus that causes COVID-19. Institutional protocols and algorithms that pertain to the evaluation of patients at risk for COVID-19 are in a state of rapid change based on information released by regulatory bodies including the CDC and federal and state organizations. These policies and  algorithms were followed during the patient's care in the ED.  Some ED evaluations and interventions may be delayed as a result of limited staffing during the pandemic.   [place] Controlled Substance Database was reviewed by me. ____________________________________________   FINAL CLINICAL IMPRESSION(S) / ED DIAGNOSES   Final diagnoses:  Altered mental status, unspecified altered mental status type  Acute cystitis without hematuria      NEW MEDICATIONS STARTED DURING THIS VISIT:  ED Discharge Orders         Ordered    cephALEXin (KEFLEX) 500 MG capsule  3 times daily     10/21/19 0217  Note:  This document was prepared using Dragon voice recognition software and may include unintentional dictation errors.    Alfred Levins, [place], MD 10/21/19 Rogene [place]

## 2019-10-20 NOTE — ED Notes (Signed)
Pt spouse reports inability to rouse pt today, which is not normal for him  Pt spouse reports that one doctor told them to d/c all medications except the thyroid medication, which they did

## 2019-10-20 NOTE — ED Triage Notes (Signed)
Pt intermittently responsive. Pt will answer questions appropriately when pressed, but seems reluctant to engage with staff  Pt is oriented x4 at this time.

## 2019-10-20 NOTE — ED Triage Notes (Signed)
Pt arrives from home via [facility] with complaint of AMS. Pt was recently seen this past Sunday for same.   Pt spouse reports pt does not "respond appropriately"   Pt spouse reports AMS worsens towards the end of the day. Pt has history of ministroke approx 4 weeks ago.   Pt spouse reports he has not been compliant with his diabetes medication CBG 250 per EMS

## 2019-10-21 DIAGNOSIS — R4182 Altered mental status, unspecified: Secondary | ICD-10-CM | POA: Diagnosis not present

## 2019-10-21 LAB — URINALYSIS, COMPLETE (UACMP) WITH MICROSCOPIC
Bilirubin Urine: NEGATIVE
Glucose, UA: 50 mg/dL — AB
Hgb urine dipstick: NEGATIVE
Ketones, ur: NEGATIVE mg/dL
Nitrite: NEGATIVE
Protein, ur: NEGATIVE mg/dL
Specific Gravity, Urine: 1.02 (ref 1.005–1.030)
WBC, UA: >50 WBC/hpf — ABNORMAL HIGH (ref 0–5)
pH: 5 (ref 5.0–8.0)

## 2019-10-21 LAB — CBC WITH DIFFERENTIAL/PLATELET
Abs Immature Granulocytes: 0.04 10*3/uL (ref 0.00–0.07)
Basophils Absolute: 0.1 10*3/uL (ref 0.0–0.1)
Basophils Relative: 1 %
Eosinophils Absolute: 0.1 10*3/uL (ref 0.0–0.5)
Eosinophils Relative: 2 %
HCT: 36.6 % — ABNORMAL LOW (ref 39.0–52.0)
Hemoglobin: 12.5 g/dL — ABNORMAL LOW (ref 13.0–17.0)
Immature Granulocytes: 1 %
Lymphocytes Relative: 27 %
Lymphs Abs: 1.9 10*3/uL (ref 0.7–4.0)
MCH: 32.5 pg (ref 26.0–34.0)
MCHC: 34.2 g/dL (ref 30.0–36.0)
MCV: 95.1 fL (ref 80.0–100.0)
Monocytes Absolute: 0.7 10*3/uL (ref 0.1–1.0)
Monocytes Relative: 9 %
Neutro Abs: 4.5 10*3/uL (ref 1.7–7.7)
Neutrophils Relative %: 60 %
Platelets: 198 10*3/uL (ref 150–400)
RBC: 3.85 MIL/uL — ABNORMAL LOW (ref 4.22–5.81)
RDW: 12.8 % (ref 11.5–15.5)
WBC: 7.3 10*3/uL (ref 4.0–10.5)
nRBC: 0 % (ref 0.0–0.2)

## 2019-10-21 LAB — COMPREHENSIVE METABOLIC PANEL
ALT: 17 U/L (ref 0–44)
AST: 20 U/L (ref 15–41)
Albumin: 3.6 g/dL (ref 3.5–5.0)
Alkaline Phosphatase: 77 U/L (ref 38–126)
Anion gap: 7 (ref 5–15)
BUN: 25 mg/dL — ABNORMAL HIGH (ref 8–23)
CO2: 25 mmol/L (ref 22–32)
Calcium: 8.8 mg/dL — ABNORMAL LOW (ref 8.9–10.3)
Chloride: 104 mmol/L (ref 98–111)
Creatinine, Ser: 1.83 mg/dL — ABNORMAL HIGH (ref 0.61–1.24)
GFR calc Af Amer: 38 mL/min — ABNORMAL LOW (ref >60)
GFR calc non Af Amer: 33 mL/min — ABNORMAL LOW (ref >60)
Glucose, Bld: 199 mg/dL — ABNORMAL HIGH (ref 70–99)
Potassium: 3.6 mmol/L (ref 3.5–5.1)
Sodium: 136 mmol/L (ref 135–145)
Total Bilirubin: 0.7 mg/dL (ref 0.3–1.2)
Total Protein: 6.7 g/dL (ref 6.5–8.1)

## 2019-10-21 MED ORDER — LACTATED RINGERS IV BOLUS
500.0000 mL | Freq: Once | INTRAVENOUS | Status: AC
  Administered 2019-10-21: 500 mL via INTRAVENOUS

## 2019-10-21 MED ORDER — CEPHALEXIN 500 MG PO CAPS: 500.0000 mg | ORAL_CAPSULE | Freq: Three times a day (TID) | ORAL | 0 refills | Status: AC

## 2019-10-21 MED ORDER — CEPHALEXIN 500 MG PO CAPS
500.0000 mg | ORAL_CAPSULE | Freq: Once | ORAL | Status: AC
  Administered 2019-10-21: 500 mg via ORAL
  Filled 2019-10-21: qty 1

## 2019-10-23 LAB — URINE CULTURE: Culture: >=100000 — AB

## 2019-10-26 ENCOUNTER — Telehealth: Payer: Self-pay | Admitting: Primary Care

## 2019-10-26 NOTE — Telephone Encounter (Signed)
Called patient's niece back, Katheran Awe, to f/u with her to see if they wished to pursue Palliative services or not - no answer, left message with my contact information requesting a call back.

## 2019-10-30 ENCOUNTER — Ambulatory Visit
Admission: EM | Admit: 2019-10-30 | Discharge: 2019-10-30 | Disposition: A | Discharge status: Home / Self Care | Payer: Medicare Other

## 2019-10-30 ENCOUNTER — Other Ambulatory Visit: Payer: Self-pay

## 2019-10-30 ENCOUNTER — Encounter: Payer: Self-pay | Admitting: Gynecology

## 2019-10-30 DIAGNOSIS — M25475 Effusion, left foot: Secondary | ICD-10-CM | POA: Diagnosis not present

## 2019-10-30 DIAGNOSIS — L270 Generalized skin eruption due to drugs and medicaments taken internally: Secondary | ICD-10-CM

## 2019-10-30 DIAGNOSIS — M25471 Effusion, right ankle: Secondary | ICD-10-CM

## 2019-10-30 DIAGNOSIS — M25472 Effusion, left ankle: Secondary | ICD-10-CM

## 2019-10-30 DIAGNOSIS — M25474 Effusion, right foot: Secondary | ICD-10-CM

## 2019-10-30 DIAGNOSIS — T50905A Adverse effect of unspecified drugs, medicaments and biological substances, initial encounter: Secondary | ICD-10-CM

## 2019-10-30 HISTORY — DX: Cerebral infarction, unspecified: I63.9

## 2019-10-30 NOTE — Discharge Instructions (Signed)
It was very nice seeing you today in clinic. Thank you for entrusting me with your care.   Suspect symptoms related to recent antibiotic use. Should resolve as medication clears system. Encourage to elevate legs to help with swelling. Unsure if UTI has cleared as he was unable to give specimen. I will enter order and you can drop sample off for testing.   Make arrangements to follow up with your regular doctor in 1 week for re-evaluation if not improving.  If your symptoms/condition worsens, please seek follow up care either here or in the ER. Please remember, our [facility] providers are "right here with you" when you need [place].   Again, it was my pleasure to take care of you today. Thank you for choosing our clinic. I hope that you start to feel better quickly.   Quentin Mulling, MSN, APRN, FNP-C, CEN Advanced Practice Provider [facility] [place] [facility]

## 2019-10-30 NOTE — ED Triage Notes (Signed)
Per wife , pt had an uti x 1 week ago. Pt was given 7 days if antibiotic. Per wife patient only take 5 days of his antibiotic because both foot swollen and break out in rash.

## 2019-10-31 ENCOUNTER — Other Ambulatory Visit: Payer: Self-pay

## 2019-10-31 ENCOUNTER — Emergency Department: Payer: Medicare Other

## 2019-10-31 ENCOUNTER — Emergency Department
Admission: EM | Admit: 2019-10-31 | Discharge: 2019-10-31 | Disposition: A | Discharge status: Home / Self Care | Payer: Medicare Other | Attending: Emergency Medicine | Admitting: Emergency Medicine

## 2019-10-31 DIAGNOSIS — Y92014 Private driveway to single-family (private) house as the place of occurrence of the external cause: Secondary | ICD-10-CM | POA: Insufficient documentation

## 2019-10-31 DIAGNOSIS — I10 Essential (primary) hypertension: Secondary | ICD-10-CM | POA: Diagnosis not present

## 2019-10-31 DIAGNOSIS — S51002A Unspecified open wound of left elbow, initial encounter: Secondary | ICD-10-CM | POA: Insufficient documentation

## 2019-10-31 DIAGNOSIS — Y939 Activity, unspecified: Secondary | ICD-10-CM | POA: Insufficient documentation

## 2019-10-31 DIAGNOSIS — N3000 Acute cystitis without hematuria: Secondary | ICD-10-CM | POA: Diagnosis not present

## 2019-10-31 DIAGNOSIS — F0390 Unspecified dementia without behavioral disturbance: Secondary | ICD-10-CM | POA: Diagnosis not present

## 2019-10-31 DIAGNOSIS — Z79899 Other long term (current) drug therapy: Secondary | ICD-10-CM | POA: Insufficient documentation

## 2019-10-31 DIAGNOSIS — Z8673 Personal history of transient ischemic attack (TIA), and cerebral infarction without residual deficits: Secondary | ICD-10-CM | POA: Diagnosis not present

## 2019-10-31 DIAGNOSIS — W19XXXA Unspecified fall, initial encounter: Secondary | ICD-10-CM

## 2019-10-31 DIAGNOSIS — Y999 Unspecified external cause status: Secondary | ICD-10-CM | POA: Diagnosis not present

## 2019-10-31 DIAGNOSIS — E119 Type 2 diabetes mellitus without complications: Secondary | ICD-10-CM | POA: Insufficient documentation

## 2019-10-31 DIAGNOSIS — W010XXA Fall on same level from slipping, tripping and stumbling without subsequent striking against object, initial encounter: Secondary | ICD-10-CM | POA: Insufficient documentation

## 2019-10-31 DIAGNOSIS — S51012A Laceration without foreign body of left elbow, initial encounter: Secondary | ICD-10-CM

## 2019-10-31 LAB — BASIC METABOLIC PANEL
Anion gap: 7 (ref 5–15)
BUN: 18 mg/dL (ref 8–23)
CO2: 27 mmol/L (ref 22–32)
Calcium: 8.6 mg/dL — ABNORMAL LOW (ref 8.9–10.3)
Chloride: 105 mmol/L (ref 98–111)
Creatinine, Ser: 1.55 mg/dL — ABNORMAL HIGH (ref 0.61–1.24)
GFR calc Af Amer: 47 mL/min — ABNORMAL LOW (ref >60)
GFR calc non Af Amer: 40 mL/min — ABNORMAL LOW (ref >60)
Glucose, Bld: 192 mg/dL — ABNORMAL HIGH (ref 70–99)
Potassium: 4 mmol/L (ref 3.5–5.1)
Sodium: 139 mmol/L (ref 135–145)

## 2019-10-31 LAB — URINALYSIS, COMPLETE (UACMP) WITH MICROSCOPIC
Bilirubin Urine: NEGATIVE
Glucose, UA: 50 mg/dL — AB
Hgb urine dipstick: NEGATIVE
Ketones, ur: NEGATIVE mg/dL
Nitrite: POSITIVE — AB
Protein, ur: 30 mg/dL — AB
Specific Gravity, Urine: 1.017 (ref 1.005–1.030)
Squamous Epithelial / HPF: NONE SEEN (ref 0–5)
pH: 6 (ref 5.0–8.0)

## 2019-10-31 LAB — CBC WITH DIFFERENTIAL/PLATELET
Abs Immature Granulocytes: 0.05 10*3/uL (ref 0.00–0.07)
Basophils Absolute: 0.1 10*3/uL (ref 0.0–0.1)
Basophils Relative: 0 %
Eosinophils Absolute: 0.1 10*3/uL (ref 0.0–0.5)
Eosinophils Relative: 1 %
HCT: 35.7 % — ABNORMAL LOW (ref 39.0–52.0)
Hemoglobin: 12.7 g/dL — ABNORMAL LOW (ref 13.0–17.0)
Immature Granulocytes: 0 %
Lymphocytes Relative: 17 %
Lymphs Abs: 2.1 10*3/uL (ref 0.7–4.0)
MCH: 33 pg (ref 26.0–34.0)
MCHC: 35.6 g/dL (ref 30.0–36.0)
MCV: 92.7 fL (ref 80.0–100.0)
Monocytes Absolute: 1 10*3/uL (ref 0.1–1.0)
Monocytes Relative: 8 %
Neutro Abs: 9.1 10*3/uL — ABNORMAL HIGH (ref 1.7–7.7)
Neutrophils Relative %: 74 %
Platelets: 173 10*3/uL (ref 150–400)
RBC: 3.85 MIL/uL — ABNORMAL LOW (ref 4.22–5.81)
RDW: 13.1 % (ref 11.5–15.5)
WBC: 12.4 10*3/uL — ABNORMAL HIGH (ref 4.0–10.5)
nRBC: 0 % (ref 0.0–0.2)

## 2019-10-31 MED ORDER — AMOXICILLIN-POT CLAVULANATE 875-125 MG PO TABS: 1.0000 | ORAL_TABLET | Freq: Two times a day (BID) | ORAL | 0 refills | Status: AC

## 2019-10-31 MED ORDER — SODIUM CHLORIDE 0.9 % IV SOLN
1.0000 g | Freq: Once | INTRAVENOUS | Status: AC
  Administered 2019-10-31: 1 g via INTRAVENOUS
  Filled 2019-10-31: qty 10

## 2019-10-31 NOTE — ED Notes (Signed)
Pt alert and oriented X 4, stable for discharge. RR even and unlabored, color WNL. Discussed discharge instructions and follow up when appropriate. Instructed to follow up with ER for any life threatening symptoms or concerns that patient or family of patient may have. Left with wife. 

## 2019-10-31 NOTE — ED Provider Notes (Signed)
[place], [place]   Name: Jerry Barrett DOB: [birth year] MRN: [id number] CSN: [id number] PCP: [facility]  Arrival date and time:  10/30/19 1300  Chief Complaint:  Allergic Reaction  NOTE: Prior to seeing the patient today, I have reviewed the triage nursing documentation and vital signs. Clinical staff has updated patient's PMH/PSHx, current medication list, and drug allergies/intolerances to ensure comprehensive history available to assist in medical decision making.   History:   HPI: Jerry Barrett is a 86 y.o. male who presents today with complaints of swelling a focal rash to his feet. Patient is HOH and has a PMH (+) for dementia; wife providing history. She advises that she just appreciated the symptoms yesterday. Patient denies pruritis and pain. Wife has been applying topical diphenhydramine and rubbing alcohol to his feet. Symptoms felt to be related to an allergic reaction to cephalexin, which patient is taking for a urinary tract infection. Wife advising that patient was prescribed a 7 day course of ABX on 10/20/2019, however he only took 5 days of the medication. She is unsure when the last dose was for sure, however she believed that he took it on Wednesday. Patient denies urinary symptoms at present. Wife notes that his AMS has improved and that his mentation seems to be at baseline. Patient has no other signs of allergic reaction; no facial swelling, SOB, stidor, wheezing, or dysphagia. Wife reports that they have attempted to contact patient's PCP office, but she has not been called back. Notes in [facility] indicate that palliative care consult has been placed and they have been trying to contact patient. Wife state, "we just found out our phone may have been off the hook and we didn't know it".   Past Medical History:  Diagnosis Date  . Dementia ([place])   . Diabetes mellitus without complication ([place])   . Hyperglycemia   . Hypertension   . Stroke ([place])   . Thyroid disease       Past Surgical History:  Procedure Laterality Date  . EYE SURGERY      History reviewed. No pertinent family history.  Social History   Tobacco Use  . Smoking status: Never Smoker  . Smokeless tobacco: Never Used  Substance Use Topics  . Alcohol use: No  . Drug use: Never    Patient Active Problem List   Diagnosis Date Noted  . Volume depletion   . Goals of care, counseling/discussion   . Palliative care by specialist   . Acute renal failure ([place]) 09/22/2019  . History of CVA with residual deficit 09/22/2019  . Hemiparesis and other late effects of cerebrovascular accident ([place]) 09/22/2019  . Hyperkalemia 09/22/2019  . SIRS (systemic inflammatory response syndrome) ([place]) 07/19/2017  . Influenza A 07/19/2017  . Diabetes ([place]) 07/19/2017  . HTN (hypertension) 07/19/2017  . Hypothyroidism 07/19/2017  . HLD (hyperlipidemia) 07/19/2017    Home Medications:    Current Meds  Medication Sig  . glipiZIDE (GLUCOTROL XL) 2.5 MG 24 hr tablet Take by mouth.  . levothyroxine (SYNTHROID, LEVOTHROID) 50 MCG tablet Take 50 mcg by mouth daily before breakfast.    Allergies:   Patient has no known allergies.  Review of Systems (ROS):  Review of systems NEGATIVE unless otherwise noted in narrative H&P section.   Vital Signs: Today's Vitals   10/30/19 1334 10/30/19 1335 10/30/19 1444  BP: 128/76    Pulse: 78    Resp: 16    Temp: 98.7 F (37.1 C)  TempSrc: Oral    SpO2: 100%    Weight:  165 lb (74.8 kg)   PainSc:  0-No pain 0-No pain    Physical Exam: Physical Exam  Constitutional: He is well-developed, well-nourished, and in no distress.  HENT:  Head: Normocephalic and atraumatic.  (+) HOH  Eyes: Pupils are equal, round, and reactive to light.  Cardiovascular: Normal rate, regular rhythm, normal heart sounds and intact distal pulses.  Pulmonary/Chest: Effort normal and breath sounds normal.  Musculoskeletal:        General: Edema (2+ non-pitting to BILATERAL  feet and ankles) present.  Neurological: He is alert. He is disoriented (has dementia Dx; oriented to person only). Abnormal gait: shuffling.  Skin: Skin is warm and dry. No rash noted. He is not diaphoretic.  Non-specific rash to feet only; rash is blanchable. No drainage, warmth, or tenderness. Denies pruritis.   Psychiatric: Mood, memory, affect and judgment normal.  Nursing note and vitals reviewed.   Urgent Care Treatments / Results:   Orders Placed This Encounter  Procedures  . Urinalysis, Complete w Microscopic    LABS: PLEASE NOTE: all labs that were ordered this encounter are listed, however only abnormal results are displayed. Labs Reviewed - No data to display  EKG: -None  RADIOLOGY: -None  PROCEDURES: Procedures  MEDICATIONS RECEIVED THIS VISIT: Medications - No data to display  PERTINENT CLINICAL COURSE NOTES/UPDATES:   Initial Impression / Assessment and Plan / Urgent Care Course:  Pertinent labs & imaging results that were available during my care of the patient were personally reviewed by me and considered in my medical decision making (see lab/imaging section of note for values and interpretations).  Jerry Barrett is a 86 y.o. male who presents to [facility] today with complaints of Allergic Reaction  Patient is well appearing overall in clinic today. He does not appear to be in any acute distress. Presenting symptoms (see HPI) and exam as documented above. Presenting symptoms potentially related to recent use of cephalexin. Medication has been stopped at this point. Rash is not pruritic or painful. Advised should resolve/improve as medication clears system. Advised to avoid topical cream to feet as this may increase risk for falls. Encouraged to elevate legs and consider compression stocking to help with edema. If not improving, will need to see PCP to discuss further evaluation for cardiac function. With the edema being acute, a cardiac etiology is  less likely.   Regarding the urinary tract infection. It is unclear if it has resolved. Patient unable to provide sample. We attempted to have him void several times in clinic today. I have not way to catheterize him as I do not have the proper equipment. Given that therapy was discontinued prior to completion of appropriate course, it is possible that he still has the infection. Standing order placed in [facility] and patient sent home with collection supplies; wife to return sample for processing.   Discussed follow up with primary care physician in 1 week for re-evaluation. I have reviewed the follow up and strict return precautions for any new or worsening symptoms. Patient is aware of symptoms that would be deemed urgent/emergent, and would thus require further evaluation either here or in the emergency department. At the time of discharge, he verbalized understanding and consent with the discharge plan as it was reviewed with him. All questions were fielded by provider and/or clinic staff prior to patient discharge.    Final Clinical Impressions / Urgent Care Diagnoses:   Final  diagnoses:  Bilateral swelling of feet and ankles  Drug rash  Adverse drug effect, initial encounter    New Prescriptions:  [place] Controlled Substance Registry consulted? Not Applicable  No orders of the defined types were placed in this encounter.   Recommended Follow up Care:  Patient encouraged to follow up with the following provider within the specified time frame, or sooner as dictated by the severity of his symptoms. As always, he was instructed that for any urgent/emergent care needs, he should seek care either here or in the emergency department for more immediate evaluation.  Follow-up Information    [place] In 1 week.   Why: General reassessment of symptoms if not improving Contact information: [address] [place] [address] [phone]         NOTE: This note was prepared  using Dragon dictation software along with smaller phrase technology. Despite my best ability to proofread, there is the potential that transcriptional errors may still occur from this process, and are completely unintentional.    Verlee Monte, NP 10/31/19 1842

## 2019-10-31 NOTE — ED Provider Notes (Signed)
[place]  [facility] Emergency Department Provider Note  ____________________________________________  Time seen: Approximately 7:38 AM  I have reviewed the triage vital signs and the nursing notes.   HISTORY  Chief Complaint Fall  Level 5 caveat:  Portions of the history and physical were unable to be obtained due to dementia   HPI Jerry Barrett is a 86 y.o. male with history of pretty advanced dementia, diabetes, hypertension, hyperlipidemia who presents for evaluation after a fall. Patient sustained a mechanical fall at home which was witnessed by his wife. Wife describes that he had a fall in the carport. She says "his foot did quite making."  He fell backwards onto the cement. No LOC. He is not on any blood thinners. He was here a week ago for confusion and diagnosed with a UTI. He took a couple of days of the antibiotics but did not finish it. He has had no vomiting or fever. Patient denies any pain. He has no recollection of the fall. History is gathered from wife and patient    Past Medical History:  Diagnosis Date  . Dementia (HCC)   . Diabetes mellitus without complication (HCC)   . Hyperglycemia   . Hypertension   . Stroke (HCC)   . Thyroid disease     Patient Active Problem List   Diagnosis Date Noted  . Volume depletion   . Goals of care, counseling/discussion   . Palliative care by specialist   . Acute renal failure (HCC) 09/22/2019  . History of CVA with residual deficit 09/22/2019  . Hemiparesis and other late effects of cerebrovascular accident (HCC) 09/22/2019  . Hyperkalemia 09/22/2019  . SIRS (systemic inflammatory response syndrome) (HCC) 07/19/2017  . Influenza A 07/19/2017  . Diabetes (HCC) 07/19/2017  . HTN (hypertension) 07/19/2017  . Hypothyroidism 07/19/2017  . HLD (hyperlipidemia) 07/19/2017    Past Surgical History:  Procedure Laterality Date  . EYE SURGERY      Prior to Admission medications   Medication Sig  Start Date End Date Taking? Authorizing Provider  amoxicillin-clavulanate (AUGMENTIN) 875-125 MG tablet Take 1 tablet by mouth 2 (two) times daily for 7 days. 10/31/19 11/07/19  Nita Sickle, MD  glipiZIDE (GLUCOTROL XL) 2.5 MG 24 hr tablet Take by mouth. 10/21/19 10/20/20  [provider]  levothyroxine (SYNTHROID, LEVOTHROID) 50 MCG tablet Take 50 mcg by mouth daily before breakfast.    [provider]    Allergies Patient has no known allergies.  History reviewed. No pertinent family history.  Social History Social History   Tobacco Use  . Smoking status: Never Smoker  . Smokeless tobacco: Never Used  Substance Use Topics  . Alcohol use: No  . Drug use: Never    Review of Systems  Constitutional: Negative for fever. Eyes: Negative for visual changes. ENT: Negative for facial injury or neck injury Cardiovascular: Negative for chest injury. Respiratory: Negative for shortness of breath. Negative for chest wall injury. Gastrointestinal: Negative for abdominal pain or injury. Genitourinary: Negative for dysuria. Musculoskeletal: Negative for back injury, negative for arm or leg pain. Skin: Negative for laceration. + L elbow abrasion Neurological: Negative for head injury.   ____________________________________________   PHYSICAL EXAM:  VITAL SIGNS: ED Triage Vitals  Enc Vitals Group     BP 10/31/19 0523 (!) 158/89     Pulse Rate 10/31/19 0523 91     Resp 10/31/19 0523 13     Temp 10/31/19 0712 98 F (36.7 C)     Temp  Source 10/31/19 0712 Oral     SpO2 10/31/19 0523 97 %     Weight 10/31/19 0524 165 lb (74.8 kg)     Height 10/31/19 0524 6' (1.829 m)     Head Circumference --      Peak Flow --      Pain Score 10/31/19 0712 0     Pain Loc --      Pain Edu? --      Excl. in GC? --     Full spinal precautions maintained throughout the trauma exam. Constitutional: Alert and oriented x 2. No acute distress. Does not appear intoxicated. HEENT  Head: Normocephalic and atraumatic. Face: No facial bony tenderness. Stable midface Ears: No hemotympanum bilaterally. No Battle sign Eyes: No eye injury. PERRL. No raccoon eyes Nose: Nontender. No epistaxis. No rhinorrhea Mouth/Throat: Mucous membranes are moist. No oropharyngeal blood. No dental injury. Airway patent without stridor. Normal voice. Neck: no C-collar. No midline c-spine tenderness.  Cardiovascular: Normal rate, regular rhythm. Normal and symmetric distal pulses are present in all extremities. Pulmonary/Chest: Chest wall is stable and nontender to palpation/compression. Normal respiratory effort. Breath sounds are normal. No crepitus.  Abdominal: Soft, nontender, non distended. Musculoskeletal: Left elbow skin tear, left knee abrasion. Nontender with normal full range of motion in all extremities. No deformities. No thoracic or lumbar midline spinal tenderness. Pelvis is stable. Skin: Skin is warm, dry and intact.  Psychiatric: Speech and behavior are appropriate. Neurological: Normal speech and language. Moves all extremities to command. No gross focal neurologic deficits are appreciated.  Glascow Coma Score: 4 - Opens eyes on own 6 - Follows simple motor commands 5 - Alert and oriented GCS: 14   ____________________________________________   LABS (all labs ordered are listed, but only abnormal results are displayed)  Labs Reviewed  URINALYSIS, COMPLETE (UACMP) WITH MICROSCOPIC - Abnormal; Notable for the following components:      Result Value   Color, Urine YELLOW (*)    APPearance HAZY (*)    Glucose, UA 50 (*)    Protein, ur 30 (*)    Nitrite POSITIVE (*)    Leukocytes,Ua SMALL (*)    Bacteria, UA MANY (*)    All other components within normal limits  CBC WITH DIFFERENTIAL/PLATELET - Abnormal; Notable for the following components:   WBC 12.4 (*)    RBC 3.85 (*)    Hemoglobin 12.7 (*)    HCT 35.7 (*)    Neutro Abs 9.1 (*)    All other components within  normal limits  BASIC METABOLIC PANEL - Abnormal; Notable for the following components:   Glucose, Bld 192 (*)    Creatinine, Ser 1.55 (*)    Calcium 8.6 (*)    GFR calc non Af Amer 40 (*)    GFR calc Af Amer 47 (*)    All other components within normal limits  URINE CULTURE   ____________________________________________  EKG  ED ECG REPORT I, Nita Sickle, the attending physician, personally viewed and interpreted this ECG.  Normal sinus rhythm, rate of 89, right bundle branch block, no ST elevations or depressions. Unchanged from prior.   ____________________________________________  RADIOLOGY  I have personally reviewed the images performed during this visit and I agree with the Radiologist's read.   Interpretation by Radiologist:  DG Elbow Complete Left  Result Date: 10/31/2019 CLINICAL DATA:  Fall, skin tears EXAM: LEFT ELBOW - COMPLETE 3+ VIEW COMPARISON:  None. FINDINGS: There is a fairly well corticated fragment seen adjacent the  tip of the coronoid which may reflect a remote injury though should correlate for point tenderness given the acute setting of trauma. No other acute fracture or traumatic osseous injury or malalignment is evident. There are mild degenerative changes about the elbow with periarticular spurring. No sizeable joint effusion though there is a slightly suboptimal projection of the lateral radiograph. Enthesopathic spur noted upon the olecranon. There is posterior soft tissue swelling likely reflecting some contusive change with laceration along the posterior soft tissues likely corresponding to reported skin tears. IMPRESSION: 1. Well corticated fragment seen adjacent the tip of the coronoid which may reflect a remote injury though should correlate for point tenderness. 2. No other acute fracture or traumatic osseous injury or malalignment. 3. Posterior soft tissue swelling and cutaneous irregularity likely reflecting some contusive change with reported  skin tears. Electronically Signed   By: Lovena Le M.D.   On: 10/31/2019 06:06   CT Head Wo Contrast  Result Date: 10/31/2019 CLINICAL DATA:  Fall EXAM: CT HEAD WITHOUT CONTRAST TECHNIQUE: Contiguous axial images were obtained from the base of the skull through the vertex without intravenous contrast. COMPARISON:  CT head 10/20/2019 FINDINGS: Brain: No evidence of acute infarction, hemorrhage, hydrocephalus, extra-axial collection or mass lesion/mass effect. Symmetric prominence of the ventricles, cisterns and sulci compatible with parenchymal volume loss. Patchy areas of white matter hypoattenuation are most compatible with chronic microvascular angiopathy. Benign dural calcifications and a small parafalcine lipoma are similar to prior. Vascular: Atherosclerotic calcification of the carotid siphons. No hyperdense vessel. Skull: No significant scalp swelling or hematoma. No calvarial fracture or suspicious osseous lesion. Sinuses/Orbits: Minimal thickening in the ethmoids. Benign osteoma in the right frontal sinus. Remaining paranasal sinuses and mastoids are predominantly clear. Orbital structures are unremarkable aside from prior lens extractions. Other: None IMPRESSION: 1. No acute intracranial abnormality. No significant scalp swelling or calvarial fracture. 2. Stable parenchymal volume loss and chronic microvascular angiopathy. Electronically Signed   By: Lovena Le M.D.   On: 10/31/2019 06:09     ____________________________________________   PROCEDURES  Procedure(s) performed:yes .1-3 Lead EKG Interpretation Performed by: Rudene Re, MD Authorized by: Rudene Re, MD     Interpretation: abnormal     ECG rate assessment: normal     Rhythm: sinus rhythm     Ectopy: none     Conduction normal: RBBB.     Critical Care performed:  None ____________________________________________   INITIAL IMPRESSION / ASSESSMENT AND PLAN / ED COURSE  86 y.o. male with history of  pretty advanced dementia, diabetes, hypertension, hyperlipidemia who presents for evaluation after a fall. The fall seems to be mechanical in nature per wife history who witnessed it. Patient has no recollection complains of no pain. He has a skin tear of his left elbow and an abrasion of the left knee but no other injuries on exam. Head CT with no acute intracranial bleed, elbow x-ray showing questionable coronoid fracture patient has no tenderness on exam therefore most likely not acute. These images were confirmed by radiology. Vitals within normal limits. Labs showing mild stable anemia, mild leukocytosis with white count of 12.4 with left shift, no significant electrolyte derangements, stable creatinine. EKG with no acute ischemia or dysrhythmias. Since patient stopped taking antibiotics for his UTI too soon and does have a white count, will repeat a urine to rule out persistent infection. Review of old medical records show that a UA from last visit grew pansensitive E. coli. History is gathered from patient and his wife  was at bedside. Old medical records reviewed. Anticipate discharge home after urinalysis.  _________________________ 8:03 AM on 10/31/2019 ----------------------------------------- UA is positive for UTI.  According to the wife, patient stopped Keflex earlier because of a rash in his lower extremities.  Are growing E. coli pansensitive.  Will switch to Augmentin.  Will give a dose of Rocephin.  Recommended follow-up with PCP.      ____________________________________________  Please note:  Patient was evaluated in Emergency Department today for the symptoms described in the history of present illness. Patient was evaluated in the context of the global COVID-19 pandemic, which necessitated consideration that the patient might be at risk for infection with the SARS-CoV-2 virus that causes COVID-19. Institutional protocols and algorithms that pertain to the evaluation of patients at  risk for COVID-19 are in a state of rapid change based on information released by regulatory bodies including the CDC and federal and state organizations. These policies and algorithms were followed during the patient's care in the ED.  Some ED evaluations and interventions may be delayed as a result of limited staffing during the pandemic.   ____________________________________________   FINAL CLINICAL IMPRESSION(S) / ED DIAGNOSES   Final diagnoses:  Fall, initial encounter  Skin tear of left elbow without complication, initial encounter  Acute cystitis without hematuria      NEW MEDICATIONS STARTED DURING THIS VISIT:  ED Discharge Orders         Ordered    amoxicillin-clavulanate (AUGMENTIN) 875-125 MG tablet  2 times daily     10/31/19 0802           Note:  This document was prepared using Dragon voice recognition software and may include unintentional dictation errors.    Don Perking, [place], MD 10/31/19 [phone]

## 2019-10-31 NOTE — ED Notes (Signed)
Pt has 3 skin tears to the left elbow. Dressings removed by MD. Wounds cleaned and vaseline guaze dressing applied and wrapped in kerlix.

## 2019-10-31 NOTE — ED Triage Notes (Signed)
Pt to ED via EMS. Pt's wife called EMS d/t pt falling this AM into car port "his foot didn't quite make it". EMS reports pt has L shoulder pain and skin tears on L elbow from fall. Pt states he fell backwards onto cement.  Per EMS pt had UTI was given meds, but did not finish them (2 days left) but went to urgent care yesterday d/t swelling and rash.  EMS VS: 160/84 BP

## 2019-10-31 NOTE — Discharge Instructions (Signed)

## 2019-11-02 LAB — URINE CULTURE: Culture: >=100000 — AB

## 2019-11-03 ENCOUNTER — Telehealth: Payer: Self-pay | Admitting: Primary Care

## 2019-11-03 NOTE — Telephone Encounter (Signed)
Spoke with patient's niece, Katheran Awe and rec'd verbal consent for Palliative services.  I have scheduled an In-person Consult for 11/09/19 @ 8:30 AM.

## 2019-11-08 ENCOUNTER — Telehealth: Payer: Self-pay | Admitting: Primary Care

## 2019-11-08 NOTE — Telephone Encounter (Signed)
Rec'd call from patient's niece Katheran Awe stating that the wife wanted to cancel the Palliative Consult that was scheduled for tomorrow, 11/09/19 @ 8:30 AM.  Niece will speak with patient and wife this Thursday and let me know if they want to reschedule or cancel the referral.

## 2019-11-09 ENCOUNTER — Other Ambulatory Visit: Payer: Self-pay | Admitting: Primary Care

## 2019-11-24 ENCOUNTER — Telehealth: Payer: Self-pay | Admitting: Primary Care

## 2019-11-24 NOTE — Telephone Encounter (Signed)
Called patient's niece to see if she was able to speak with patient and/or wife to see if they wanted to reschedule the Palliative Consult or cancel.  Left me name and contact number requesting a call back.  I also called and spoke with patient's wife to see if she wanted to reschedule or cancel the referral and she wanted to speak with their niece Junious Dresser first and either the wife or Junious Dresser would call me back.

## 2019-11-27 ENCOUNTER — Emergency Department: Payer: Medicare Other

## 2019-11-27 ENCOUNTER — Other Ambulatory Visit: Payer: Self-pay

## 2019-11-27 ENCOUNTER — Inpatient Hospital Stay
Admission: EM | Admit: 2019-11-27 | Discharge: 2019-11-30 | DRG: 871 | Disposition: A | Discharge status: Home Health | Payer: Medicare Other | Attending: Internal Medicine | Admitting: Internal Medicine

## 2019-11-27 DIAGNOSIS — A419 Sepsis, unspecified organism: Principal | ICD-10-CM | POA: Diagnosis present

## 2019-11-27 DIAGNOSIS — Z8673 Personal history of transient ischemic attack (TIA), and cerebral infarction without residual deficits: Secondary | ICD-10-CM | POA: Diagnosis not present

## 2019-11-27 DIAGNOSIS — E039 Hypothyroidism, unspecified: Secondary | ICD-10-CM | POA: Diagnosis present

## 2019-11-27 DIAGNOSIS — N136 Pyonephrosis: Secondary | ICD-10-CM | POA: Diagnosis present

## 2019-11-27 DIAGNOSIS — N390 Urinary tract infection, site not specified: Secondary | ICD-10-CM | POA: Diagnosis present

## 2019-11-27 DIAGNOSIS — J189 Pneumonia, unspecified organism: Secondary | ICD-10-CM | POA: Diagnosis present

## 2019-11-27 DIAGNOSIS — N1832 Chronic kidney disease, stage 3b: Secondary | ICD-10-CM | POA: Diagnosis present

## 2019-11-27 DIAGNOSIS — Z20822 Contact with and (suspected) exposure to covid-19: Secondary | ICD-10-CM | POA: Diagnosis present

## 2019-11-27 DIAGNOSIS — I129 Hypertensive chronic kidney disease with stage 1 through stage 4 chronic kidney disease, or unspecified chronic kidney disease: Secondary | ICD-10-CM | POA: Diagnosis present

## 2019-11-27 DIAGNOSIS — H9190 Unspecified hearing loss, unspecified ear: Secondary | ICD-10-CM | POA: Diagnosis present

## 2019-11-27 DIAGNOSIS — I10 Essential (primary) hypertension: Secondary | ICD-10-CM | POA: Diagnosis not present

## 2019-11-27 DIAGNOSIS — Y92009 Unspecified place in unspecified non-institutional (private) residence as the place of occurrence of the external cause: Secondary | ICD-10-CM | POA: Diagnosis not present

## 2019-11-27 DIAGNOSIS — Z794 Long term (current) use of insulin: Secondary | ICD-10-CM | POA: Diagnosis not present

## 2019-11-27 DIAGNOSIS — F0390 Unspecified dementia without behavioral disturbance: Secondary | ICD-10-CM | POA: Diagnosis present

## 2019-11-27 DIAGNOSIS — A4150 Gram-negative sepsis, unspecified: Secondary | ICD-10-CM | POA: Diagnosis present

## 2019-11-27 DIAGNOSIS — E119 Type 2 diabetes mellitus without complications: Secondary | ICD-10-CM | POA: Diagnosis not present

## 2019-11-27 DIAGNOSIS — W19XXXA Unspecified fall, initial encounter: Secondary | ICD-10-CM | POA: Diagnosis not present

## 2019-11-27 DIAGNOSIS — E1122 Type 2 diabetes mellitus with diabetic chronic kidney disease: Secondary | ICD-10-CM | POA: Diagnosis present

## 2019-11-27 DIAGNOSIS — Y998 Other external cause status: Secondary | ICD-10-CM | POA: Diagnosis not present

## 2019-11-27 DIAGNOSIS — R6520 Severe sepsis without septic shock: Secondary | ICD-10-CM | POA: Diagnosis not present

## 2019-11-27 DIAGNOSIS — S0990XA Unspecified injury of head, initial encounter: Secondary | ICD-10-CM | POA: Diagnosis present

## 2019-11-27 DIAGNOSIS — Z7984 Long term (current) use of oral hypoglycemic drugs: Secondary | ICD-10-CM | POA: Diagnosis not present

## 2019-11-27 DIAGNOSIS — E785 Hyperlipidemia, unspecified: Secondary | ICD-10-CM | POA: Diagnosis present

## 2019-11-27 DIAGNOSIS — Y9389 Activity, other specified: Secondary | ICD-10-CM | POA: Diagnosis not present

## 2019-11-27 DIAGNOSIS — N179 Acute kidney failure, unspecified: Secondary | ICD-10-CM | POA: Diagnosis present

## 2019-11-27 DIAGNOSIS — R4182 Altered mental status, unspecified: Secondary | ICD-10-CM | POA: Diagnosis present

## 2019-11-27 DIAGNOSIS — Z79899 Other long term (current) drug therapy: Secondary | ICD-10-CM | POA: Diagnosis not present

## 2019-11-27 DIAGNOSIS — Z66 Do not resuscitate: Secondary | ICD-10-CM | POA: Diagnosis present

## 2019-11-27 DIAGNOSIS — N189 Chronic kidney disease, unspecified: Secondary | ICD-10-CM | POA: Diagnosis not present

## 2019-11-27 DIAGNOSIS — Z79890 Hormone replacement therapy (postmenopausal): Secondary | ICD-10-CM | POA: Diagnosis not present

## 2019-11-27 DIAGNOSIS — N170 Acute kidney failure with tubular necrosis: Secondary | ICD-10-CM | POA: Diagnosis not present

## 2019-11-27 DIAGNOSIS — Z881 Allergy status to other antibiotic agents status: Secondary | ICD-10-CM

## 2019-11-27 LAB — CBC WITH DIFFERENTIAL/PLATELET
Abs Immature Granulocytes: 0.06 10*3/uL (ref 0.00–0.07)
Basophils Absolute: 0.1 10*3/uL (ref 0.0–0.1)
Basophils Relative: 1 %
Eosinophils Absolute: 0.2 10*3/uL (ref 0.0–0.5)
Eosinophils Relative: 2 %
HCT: 34.9 % — ABNORMAL LOW (ref 39.0–52.0)
Hemoglobin: 12.4 g/dL — ABNORMAL LOW (ref 13.0–17.0)
Immature Granulocytes: 1 %
Lymphocytes Relative: 15 %
Lymphs Abs: 2 10*3/uL (ref 0.7–4.0)
MCH: 32.9 pg (ref 26.0–34.0)
MCHC: 35.5 g/dL (ref 30.0–36.0)
MCV: 92.6 fL (ref 80.0–100.0)
Monocytes Absolute: 1 10*3/uL (ref 0.1–1.0)
Monocytes Relative: 7 %
Neutro Abs: 9.8 10*3/uL — ABNORMAL HIGH (ref 1.7–7.7)
Neutrophils Relative %: 74 %
Platelets: 186 10*3/uL (ref 150–400)
RBC: 3.77 MIL/uL — ABNORMAL LOW (ref 4.22–5.81)
RDW: 12.9 % (ref 11.5–15.5)
WBC: 13 10*3/uL — ABNORMAL HIGH (ref 4.0–10.5)
nRBC: 0 % (ref 0.0–0.2)

## 2019-11-27 LAB — COMPREHENSIVE METABOLIC PANEL
ALT: 16 U/L (ref 0–44)
AST: 23 U/L (ref 15–41)
Albumin: 3.3 g/dL — ABNORMAL LOW (ref 3.5–5.0)
Alkaline Phosphatase: 73 U/L (ref 38–126)
Anion gap: 8 (ref 5–15)
BUN: 20 mg/dL (ref 8–23)
CO2: 25 mmol/L (ref 22–32)
Calcium: 8.2 mg/dL — ABNORMAL LOW (ref 8.9–10.3)
Chloride: 106 mmol/L (ref 98–111)
Creatinine, Ser: 1.63 mg/dL — ABNORMAL HIGH (ref 0.61–1.24)
GFR calc Af Amer: 44 mL/min — ABNORMAL LOW (ref >60)
GFR calc non Af Amer: 38 mL/min — ABNORMAL LOW (ref >60)
Glucose, Bld: 113 mg/dL — ABNORMAL HIGH (ref 70–99)
Potassium: 3.7 mmol/L (ref 3.5–5.1)
Sodium: 139 mmol/L (ref 135–145)
Total Bilirubin: 0.7 mg/dL (ref 0.3–1.2)
Total Protein: 6.8 g/dL (ref 6.5–8.1)

## 2019-11-27 LAB — URINALYSIS, ROUTINE W REFLEX MICROSCOPIC
Bilirubin Urine: NEGATIVE
Glucose, UA: NEGATIVE mg/dL
Hgb urine dipstick: NEGATIVE
Ketones, ur: NEGATIVE mg/dL
Leukocytes,Ua: NEGATIVE
Nitrite: NEGATIVE
Protein, ur: NEGATIVE mg/dL
Specific Gravity, Urine: 1.008 (ref 1.005–1.030)
pH: 7 (ref 5.0–8.0)

## 2019-11-27 LAB — PROTIME-INR
INR: 1 (ref 0.8–1.2)
Prothrombin Time: 12.8 seconds (ref 11.4–15.2)

## 2019-11-27 LAB — LACTIC ACID, PLASMA
Lactic Acid, Venous: 1.9 mmol/L (ref 0.5–1.9)
Lactic Acid, Venous: 1.7 mmol/L (ref 0.5–1.9)

## 2019-11-27 LAB — APTT: aPTT: 27 seconds (ref 24–36)

## 2019-11-27 MED ORDER — PIPERACILLIN-TAZOBACTAM 3.375 G IVPB 30 MIN
3.3750 g | Freq: Once | INTRAVENOUS | Status: AC
  Administered 2019-11-27: 3.375 g via INTRAVENOUS
  Filled 2019-11-27: qty 50

## 2019-11-27 MED ORDER — SODIUM CHLORIDE 0.9 % IV BOLUS
1000.0000 mL | Freq: Once | INTRAVENOUS | Status: AC
  Administered 2019-11-27: 1000 mL via INTRAVENOUS

## 2019-11-27 MED ORDER — SODIUM CHLORIDE 0.9 % IV SOLN: 2.0000 g | Freq: Once | INTRAVENOUS | Status: DC

## 2019-11-27 MED ORDER — ACETAMINOPHEN 650 MG RE SUPP
650.0000 mg | Freq: Once | RECTAL | Status: AC
  Administered 2019-11-27: 650 mg via RECTAL
  Filled 2019-11-27: qty 1

## 2019-11-27 NOTE — ED Provider Notes (Signed)
[facility] Emergency Department Provider Note  ____________________________________________   First MD Initiated Contact with Patient 11/27/19 2007     (approximate)  I have reviewed the triage vital signs and the nursing notes.   HISTORY  Chief Complaint Altered Mental Status    HPI Jerry Barrett is a 86 y.o. male with past medical history of hypertension, hyperlipidemia, dementia, diabetes, here with fever, chills, weakness.  The patient reportedly has been treated as an outpatient for UTI for the last 2 weeks.  He has been on 2 courses of antibiotics.  He finished his most recent course several days ago.  Since then, he has had an increasing confusion, rigors, and poor appetite.  He began having severe shakes, fever today so wife called EMS.  Patient has a history of recurrent UTIs.  It is unknown what antibiotics he was on.  History limited on my assessment due to confusion.  Level 5 caveat invoked as remainder of history, ROS, and physical exam limited due to patient's mental status/dementia/confusion.         Past Medical History:  Diagnosis Date  . Dementia (HCC)   . Diabetes mellitus without complication (HCC)   . Hyperglycemia   . Hypertension   . Stroke (HCC)   . Thyroid disease     Patient Active Problem List   Diagnosis Date Noted  . Sepsis due to gram-negative UTI (HCC) 11/27/2019  . Volume depletion   . Goals of care, counseling/discussion   . Palliative care by specialist   . Acute renal failure (HCC) 09/22/2019  . History of CVA with residual deficit 09/22/2019  . Hemiparesis and other late effects of cerebrovascular accident (HCC) 09/22/2019  . Hyperkalemia 09/22/2019  . SIRS (systemic inflammatory response syndrome) (HCC) 07/19/2017  . Influenza A 07/19/2017  . Diabetes (HCC) 07/19/2017  . HTN (hypertension) 07/19/2017  . Hypothyroidism 07/19/2017  . HLD (hyperlipidemia) 07/19/2017    Past Surgical History:    Procedure Laterality Date  . EYE SURGERY      Prior to Admission medications   Medication Sig Start Date End Date Taking? Authorizing Provider  glipiZIDE (GLUCOTROL XL) 2.5 MG 24 hr tablet Take by mouth. 10/21/19 10/20/20  [provider]  levothyroxine (SYNTHROID, LEVOTHROID) 50 MCG tablet Take 50 mcg by mouth daily before breakfast.    [provider]    Allergies Cephalexin  History reviewed. No pertinent family history.  Social History Social History   Tobacco Use  . Smoking status: Never Smoker  . Smokeless tobacco: Never Used  Vaping Use  . Vaping Use: Never used  Substance Use Topics  . Alcohol use: No  . Drug use: Never    Review of Systems  Review of Systems  Unable to perform ROS: Mental status change     ____________________________________________  PHYSICAL EXAM:      VITAL SIGNS: ED Triage Vitals  Enc Vitals Group     BP 11/27/19 2011 (!) 167/113     Pulse Rate 11/27/19 2011 (!) 112     Resp 11/27/19 2011 18     Temp 11/27/19 2011 99.7 F (37.6 C)     Temp Source 11/27/19 2011 Oral     SpO2 11/27/19 2011 100 %     Weight 11/27/19 2012 164 lb 15.9 oz (74.8 kg)     Height 11/27/19 2012 6' (1.829 m)     Head Circumference --      Peak Flow --      Pain  Score 11/27/19 2012 0     Pain Loc --      Pain Edu? --      Excl. in [place]? --      Physical Exam Vitals and nursing note reviewed.  Constitutional:      General: He is not in acute distress.    Appearance: He is well-developed. He is ill-appearing.  HENT:     Head: Normocephalic and atraumatic.     Mouth/Throat:     Mouth: Mucous membranes are dry.  Eyes:     Conjunctiva/sclera: Conjunctivae normal.  Cardiovascular:     Rate and Rhythm: Regular rhythm. Tachycardia present.     Heart sounds: Normal heart sounds.  Pulmonary:     Effort: Pulmonary effort is normal. No respiratory distress.     Breath sounds: No wheezing.  Abdominal:     General: There is no distension.      Tenderness: There is abdominal tenderness (mild suprapubic).  Musculoskeletal:     Cervical back: Neck supple.  Skin:    General: Skin is warm.     Capillary Refill: Capillary refill takes less than 2 seconds.     Findings: No rash.  Neurological:     Mental Status: He is alert. He is disoriented.     Motor: No abnormal muscle tone.     Comments: Disoriented, but follows commands. No CN deficits appreciated. MAE.       ____________________________________________   LABS (all labs ordered are listed, but only abnormal results are displayed)  Labs Reviewed  COMPREHENSIVE METABOLIC PANEL - Abnormal; Notable for the following components:      Result Value   Glucose, Bld 113 (*)    Creatinine, Ser 1.63 (*)    Calcium 8.2 (*)    Albumin 3.3 (*)    GFR calc non Af Amer 38 (*)    GFR calc Af Amer 44 (*)    All other components within normal limits  CBC WITH DIFFERENTIAL/PLATELET - Abnormal; Notable for the following components:   WBC 13.0 (*)    RBC 3.77 (*)    Hemoglobin 12.4 (*)    HCT 34.9 (*)    Neutro Abs 9.8 (*)    All other components within normal limits  URINALYSIS, ROUTINE W REFLEX MICROSCOPIC - Abnormal; Notable for the following components:   Color, Urine STRAW (*)    APPearance CLEAR (*)    All other components within normal limits  SARS CORONAVIRUS 2 BY RT PCR (HOSPITAL ORDER, [facility])  CULTURE, BLOOD (ROUTINE X 2)  CULTURE, BLOOD (ROUTINE X 2)  URINE CULTURE  CULTURE, BLOOD (ROUTINE X 2)  CULTURE, BLOOD (ROUTINE X 2)  EXPECTORATED SPUTUM ASSESSMENT W REFEX TO RESP CULTURE  LACTIC ACID, PLASMA  LACTIC ACID, PLASMA  APTT  PROTIME-INR  TSH  HEMOGLOBIN A1C  HIV ANTIBODY (ROUTINE TESTING W REFLEX)  PROTIME-INR  CORTISOL-AM, BLOOD  PROCALCITONIN  LEGIONELLA PNEUMOPHILA SEROGP 1 UR AG  STREP PNEUMONIAE URINARY ANTIGEN  BASIC METABOLIC PANEL  CBC    ____________________________________________  EKG: Sinus tachycardia,  VR 108. QRS 104, QTc 449. No acute ST elevations or depressions. Repol abnormality with RAD.   ________________________________________  RADIOLOGY All imaging, including plain films, CT scans, and ultrasounds, independently reviewed by me, and interpretations confirmed via formal radiology reads.  ED MD interpretation:   CT Head: NAICA CT Stone: Hydronephrosis noted but no obstructing stone, no surgical abnormality CXR: Mild left basilar atelectasis  Official radiology report(s): CT Head  Wo Contrast  Result Date: 11/27/2019 CLINICAL DATA:  86 year old male with encephalopathy. EXAM: CT HEAD WITHOUT CONTRAST TECHNIQUE: Contiguous axial images were obtained from the base of the skull through the vertex without intravenous contrast. COMPARISON:  Head CT dated 10/31/2019. FINDINGS: Brain: Mild age-related atrophy and chronic microvascular ischemic changes. There is no acute intracranial hemorrhage. No mass effect or midline shift. No extra-axial fluid collection. Vascular: No hyperdense vessel or unexpected calcification. Skull: Normal. Negative for fracture or focal lesion. Sinuses/Orbits: No acute finding. Other: None IMPRESSION: 1. No acute intracranial pathology. 2. Mild age-related atrophy and chronic microvascular ischemic changes. Electronically Signed   By: Elgie CollardArash  Radparvar M.D.   On: 11/27/2019 22:33   DG Chest Port 1 View  Result Date: 11/27/2019 CLINICAL DATA:  Urinary tract infection. EXAM: PORTABLE CHEST 1 VIEW COMPARISON:  September 24, 2019 FINDINGS: Decreased lung volumes are seen which is likely secondary to the degree of patient inspiration. Very mild left basilar atelectasis and/or infiltrate is noted. There is no evidence of a pleural effusion or pneumothorax. The heart size and mediastinal contours are within normal limits. There is marked severity calcification of the aortic arch. The visualized skeletal structures are unremarkable. IMPRESSION: Very mild left basilar atelectasis  and/or infiltrate. Electronically Signed   By: Aram Candelahaddeus  Houston M.D.   On: 11/27/2019 20:40   CT Renal Stone Study  Result Date: 11/27/2019 CLINICAL DATA:  Urinary tract infection. EXAM: CT ABDOMEN AND PELVIS WITHOUT CONTRAST TECHNIQUE: Multidetector CT imaging of the abdomen and pelvis was performed following the standard protocol without IV contrast. COMPARISON:  September 22, 2018 FINDINGS: Lower chest: Mild atelectasis is seen within the posterior aspects of the bilateral lung bases. Hepatobiliary: No focal liver abnormality is seen. No gallstones, gallbladder wall thickening, or biliary dilatation. Pancreas: Unremarkable. No pancreatic ductal dilatation or surrounding inflammatory changes. Spleen: Normal in size without focal abnormality. Adrenals/Urinary Tract: Adrenal glands are unremarkable. Kidneys are normal in size, without renal calculi or focal lesions. Mild bilateral hydronephrosis and proximal hydroureter are seen. Bladder is unremarkable. Stomach/Bowel: There is a moderate-sized fat containing hiatal hernia. Stomach is within normal limits. Appendix appears normal. No evidence of bowel wall thickening, distention, or inflammatory changes. Vascular/Lymphatic: There is marked severity calcification of the abdominal aorta and bilateral common iliac arteries. No enlarged abdominal or pelvic lymph nodes. Reproductive: There is mild to moderate severity enlargement of the prostate gland. Other: No abdominal wall hernia or abnormality. No abdominopelvic ascites. Musculoskeletal: Multilevel degenerative changes are seen throughout the lumbar spine. IMPRESSION: 1. Mild bilateral hydronephrosis and proximal hydroureter without evidence of renal or ureteral calculi. 2. Moderate-sized fat containing hiatal hernia. 3. Mild to moderate severity enlargement of the prostate gland. 4. Aortic atherosclerosis. Aortic Atherosclerosis (ICD10-I70.0). Electronically Signed   By: Aram Candelahaddeus  Houston M.D.   On: 11/27/2019  22:35    ____________________________________________  PROCEDURES   Procedure(s) performed (including Critical Care):  .Critical Care Performed by: Shaune PollackIsaacs, Puneet Masoner, MD Authorized by: Shaune PollackIsaacs, Stepahnie Campo, MD   Critical care provider statement:    Critical care time (minutes):  35   Critical care time was exclusive of:  Separately billable procedures and treating other patients and teaching time   Critical care was necessary to treat or prevent imminent or life-threatening deterioration of the following conditions:  Cardiac failure, circulatory failure, respiratory failure and sepsis   Critical care was time spent personally by me on the following activities:  Development of treatment plan with patient or surrogate, discussions with consultants, evaluation of patient's response  to treatment, examination of patient, obtaining history from patient or surrogate, ordering and performing treatments and interventions, ordering and review of laboratory studies, ordering and review of radiographic studies, pulse oximetry, re-evaluation of patient's condition and review of old charts   I assumed direction of critical care for this patient from another provider in my specialty: no   .1-3 Lead EKG Interpretation Performed by: Shaune Pollack, MD Authorized by: Shaune Pollack, MD     Interpretation: normal     ECG rate:  90-110   ECG rate assessment: tachycardic     Rhythm: sinus tachycardia     Ectopy: none     Conduction: normal   Comments:     Indication: Sepsis    ____________________________________________  INITIAL IMPRESSION / MDM / ASSESSMENT AND PLAN / ED COURSE  As part of my medical decision making, I reviewed the following data within the electronic MEDICAL RECORD NUMBER Nursing notes reviewed and incorporated, Old chart reviewed, Notes from prior ED visits, and [place] Controlled Substance Database       *Jerry Barrett was evaluated in Emergency Department on 11/28/2019 for the symptoms  described in the history of present illness. He was evaluated in the context of the global COVID-19 pandemic, which necessitated consideration that the patient might be at risk for infection with the SARS-CoV-2 virus that causes COVID-19. Institutional protocols and algorithms that pertain to the evaluation of patients at risk for COVID-19 are in a state of rapid change based on information released by regulatory bodies including the CDC and federal and state organizations. These policies and algorithms were followed during the patient's care in the ED.  Some ED evaluations and interventions may be delayed as a result of limited staffing during the pandemic.*     Medical Decision Making:  86 yo M here with sepsis, possibly from partially-treated UTI versus PNA based on CXR. Pt febrile, confused on arrival so broad-spectrum ABX and fluids started. No focal neuro deficits. CT head is negative. Given his age, fever, and failure of outpt abx for his UTI, CT scan obtained and interestingly does show hydronephrosis, though no obstructing stone. UA pending. Case discussed with Dr. Annabell Howells, who recommends foley placement but does not feel pt need urgent intervention. Will admit for further work-up and monitoring. Wife updated at bedside.  ____________________________________________  FINAL CLINICAL IMPRESSION(S) / ED DIAGNOSES  Final diagnoses:  Sepsis, due to unspecified organism, unspecified whether acute organ dysfunction present (HCC)     MEDICATIONS GIVEN DURING THIS VISIT:  Medications  levothyroxine (SYNTHROID) tablet 50 mcg (has no administration in time range)  insulin aspart (novoLOG) injection 0-9 Units (has no administration in time range)  enoxaparin (LOVENOX) injection 40 mg (has no administration in time range)  0.9 %  sodium chloride infusion (has no administration in time range)  acetaminophen (TYLENOL) tablet 650 mg (has no administration in time range)    Or  acetaminophen (TYLENOL)  suppository 650 mg (has no administration in time range)  traZODone (DESYREL) tablet 25 mg (has no administration in time range)  magnesium hydroxide (MILK OF MAGNESIA) suspension 30 mL (has no administration in time range)  ondansetron (ZOFRAN) tablet 4 mg (has no administration in time range)    Or  ondansetron (ZOFRAN) injection 4 mg (has no administration in time range)  guaiFENesin (MUCINEX) 12 hr tablet 600 mg (has no administration in time range)  chlorpheniramine-HYDROcodone (TUSSIONEX) 10-8 MG/5ML suspension 5 mL (has no administration in time range)  ipratropium-albuterol (DUONEB) 0.5-2.5 (3) MG/3ML  nebulizer solution 3 mL (has no administration in time range)  cefTRIAXone (ROCEPHIN) 1 g in sodium chloride 0.9 % 100 mL IVPB (has no administration in time range)  methylPREDNISolone sodium succinate (SOLU-MEDROL) 40 mg/mL injection 40 mg (has no administration in time range)  sodium chloride 0.9 % bolus 1,000 mL (0 mLs Intravenous Stopped 11/27/19 2232)  acetaminophen (TYLENOL) suppository 650 mg (650 mg Rectal Given 11/27/19 2052)  piperacillin-tazobactam (ZOSYN) IVPB 3.375 g (0 g Intravenous Stopped 11/27/19 2232)     ED Discharge Orders    None       Note:  This document was prepared using Dragon voice recognition software and may include unintentional dictation errors.   Shaune Pollack, MD 11/28/19 [phone]

## 2019-11-27 NOTE — Consult Note (Signed)
PHARMACY -  BRIEF ANTIBIOTIC NOTE   Pharmacy has received consult(s) for Zosyn from an ED provider.  The patient's profile has been reviewed for ht/wt/allergies/indication/available labs.    One time order(s) placed for Zosyn 3.375g x1   Further antibiotics/pharmacy consults should be ordered by admitting physician if indicated.                       Thank you,  Albina Billet, PharmD, BCPS Clinical Pharmacist 11/27/2019 9:25 PM

## 2019-11-27 NOTE — ED Triage Notes (Signed)
Pt to ED via EMS. Pt on 3rd round abx for UTI. Pt's wife reports pt is altered from baseline.  EMS VS: 101.7 axillary, 96% RA, CBG 124. Pt given 1L NS by EMS.

## 2019-11-27 NOTE — Consult Note (Signed)
CODE SEPSIS - PHARMACY COMMUNICATION  **Broad Spectrum Antibiotics should be administered within 1 hour of Sepsis diagnosis**  Time Code Sepsis Called/Page Received: 2016  Antibiotics Ordered: Zosyn  Time of 1st antibiotic administration: 2056  Additional action taken by pharmacy: None  If necessary, Name of Provider/Nurse Contacted: N/A    Albina Billet ,PharmD Clinical Pharmacist  11/27/2019  8:37 PM

## 2019-11-28 DIAGNOSIS — J189 Pneumonia, unspecified organism: Secondary | ICD-10-CM

## 2019-11-28 DIAGNOSIS — N189 Chronic kidney disease, unspecified: Secondary | ICD-10-CM

## 2019-11-28 DIAGNOSIS — A419 Sepsis, unspecified organism: Secondary | ICD-10-CM | POA: Diagnosis present

## 2019-11-28 DIAGNOSIS — E785 Hyperlipidemia, unspecified: Secondary | ICD-10-CM

## 2019-11-28 DIAGNOSIS — N179 Acute kidney failure, unspecified: Secondary | ICD-10-CM

## 2019-11-28 DIAGNOSIS — N170 Acute kidney failure with tubular necrosis: Secondary | ICD-10-CM

## 2019-11-28 DIAGNOSIS — R6520 Severe sepsis without septic shock: Secondary | ICD-10-CM

## 2019-11-28 LAB — BASIC METABOLIC PANEL
Anion gap: 6 (ref 5–15)
BUN: 18 mg/dL (ref 8–23)
CO2: 23 mmol/L (ref 22–32)
Calcium: 7.8 mg/dL — ABNORMAL LOW (ref 8.9–10.3)
Chloride: 109 mmol/L (ref 98–111)
Creatinine, Ser: 1.45 mg/dL — ABNORMAL HIGH (ref 0.61–1.24)
GFR calc Af Amer: 51 mL/min — ABNORMAL LOW (ref >60)
GFR calc non Af Amer: 44 mL/min — ABNORMAL LOW (ref >60)
Glucose, Bld: 95 mg/dL (ref 70–99)
Potassium: 3.5 mmol/L (ref 3.5–5.1)
Sodium: 138 mmol/L (ref 135–145)

## 2019-11-28 LAB — GLUCOSE, CAPILLARY
Glucose-Capillary: 155 mg/dL — ABNORMAL HIGH (ref 70–99)
Glucose-Capillary: 198 mg/dL — ABNORMAL HIGH (ref 70–99)
Glucose-Capillary: 194 mg/dL — ABNORMAL HIGH (ref 70–99)
Glucose-Capillary: 185 mg/dL — ABNORMAL HIGH (ref 70–99)

## 2019-11-28 LAB — HIV ANTIBODY (ROUTINE TESTING W REFLEX): HIV Screen 4th Generation wRfx: NONREACTIVE

## 2019-11-28 LAB — CBC
HCT: 33.1 % — ABNORMAL LOW (ref 39.0–52.0)
Hemoglobin: 11.6 g/dL — ABNORMAL LOW (ref 13.0–17.0)
MCH: 33 pg (ref 26.0–34.0)
MCHC: 35 g/dL (ref 30.0–36.0)
MCV: 94 fL (ref 80.0–100.0)
Platelets: 162 10*3/uL (ref 150–400)
RBC: 3.52 MIL/uL — ABNORMAL LOW (ref 4.22–5.81)
RDW: 12.9 % (ref 11.5–15.5)
WBC: 16 10*3/uL — ABNORMAL HIGH (ref 4.0–10.5)
nRBC: 0 % (ref 0.0–0.2)

## 2019-11-28 LAB — PROTIME-INR
INR: 1.2 (ref 0.8–1.2)
Prothrombin Time: 14.3 seconds (ref 11.4–15.2)

## 2019-11-28 LAB — HEMOGLOBIN A1C
Hgb A1c MFr Bld: 6.9 % — ABNORMAL HIGH (ref 4.8–5.6)
Mean Plasma Glucose: 151 mg/dL

## 2019-11-28 LAB — CORTISOL-AM, BLOOD: Cortisol - AM: 9 ug/dL (ref 6.7–22.6)

## 2019-11-28 LAB — SARS CORONAVIRUS 2 BY RT PCR (HOSPITAL ORDER, PERFORMED IN ~~LOC~~ HOSPITAL LAB): SARS Coronavirus 2: NEGATIVE

## 2019-11-28 LAB — STREP PNEUMONIAE URINARY ANTIGEN: Strep Pneumo Urinary Antigen: NEGATIVE

## 2019-11-28 LAB — TSH: TSH: 1.456 u[IU]/mL (ref 0.350–4.500)

## 2019-11-28 LAB — PROCALCITONIN: Procalcitonin: 0.77 ng/mL

## 2019-11-28 MED ORDER — METHYLPREDNISOLONE SODIUM SUCC 40 MG IJ SOLR
40.0000 mg | Freq: Three times a day (TID) | INTRAMUSCULAR | Status: DC
  Administered 2019-11-28 – 2019-11-29 (×4): 40 mg via INTRAVENOUS
  Filled 2019-11-28 (×4): qty 1

## 2019-11-28 MED ORDER — GLIPIZIDE ER 2.5 MG PO TB24: 2.5000 mg | ORAL_TABLET | Freq: Every day | ORAL | Status: DC

## 2019-11-28 MED ORDER — LEVOTHYROXINE SODIUM 50 MCG PO TABS
50.0000 ug | ORAL_TABLET | Freq: Every day | ORAL | Status: DC
  Administered 2019-11-28 – 2019-11-30 (×3): 50 ug via ORAL
  Filled 2019-11-28 (×3): qty 1

## 2019-11-28 MED ORDER — MAGNESIUM HYDROXIDE 400 MG/5ML PO SUSP: 30.0000 mL | Freq: Every day | ORAL | Status: DC | PRN

## 2019-11-28 MED ORDER — LEVOFLOXACIN IN D5W 750 MG/150ML IV SOLN
750.0000 mg | INTRAVENOUS | Status: DC
  Administered 2019-11-28: 750 mg via INTRAVENOUS
  Filled 2019-11-28 (×2): qty 150

## 2019-11-28 MED ORDER — SODIUM CHLORIDE 0.9 % IV SOLN: 1.0000 g | INTRAVENOUS | Status: DC

## 2019-11-28 MED ORDER — TRAZODONE HCL 50 MG PO TABS
25.0000 mg | ORAL_TABLET | Freq: Every evening | ORAL | Status: DC | PRN
  Filled 2019-11-28: qty 1

## 2019-11-28 MED ORDER — ONDANSETRON HCL 4 MG PO TABS: 4.0000 mg | ORAL_TABLET | Freq: Four times a day (QID) | ORAL | Status: DC | PRN

## 2019-11-28 MED ORDER — INSULIN ASPART 100 UNIT/ML ~~LOC~~ SOLN
0.0000 [IU] | Freq: Three times a day (TID) | SUBCUTANEOUS | Status: DC
  Administered 2019-11-28 – 2019-11-29 (×5): 2 [IU] via SUBCUTANEOUS
  Administered 2019-11-29: 3 [IU] via SUBCUTANEOUS
  Administered 2019-11-29: 5 [IU] via SUBCUTANEOUS
  Administered 2019-11-29: 1 [IU] via SUBCUTANEOUS
  Filled 2019-11-28 (×8): qty 1

## 2019-11-28 MED ORDER — IPRATROPIUM-ALBUTEROL 0.5-2.5 (3) MG/3ML IN SOLN: 3.0000 mL | RESPIRATORY_TRACT | Status: DC | PRN

## 2019-11-28 MED ORDER — GUAIFENESIN ER 600 MG PO TB12
600.0000 mg | ORAL_TABLET | Freq: Two times a day (BID) | ORAL | Status: DC
  Administered 2019-11-28 – 2019-11-30 (×5): 600 mg via ORAL
  Filled 2019-11-28 (×6): qty 1

## 2019-11-28 MED ORDER — ENOXAPARIN SODIUM 40 MG/0.4ML ~~LOC~~ SOLN
40.0000 mg | SUBCUTANEOUS | Status: DC
  Administered 2019-11-28 – 2019-11-30 (×2): 40 mg via SUBCUTANEOUS
  Filled 2019-11-28 (×3): qty 0.4

## 2019-11-28 MED ORDER — SODIUM CHLORIDE 0.9 % IV SOLN: INTRAVENOUS | Status: DC

## 2019-11-28 MED ORDER — ACETAMINOPHEN 325 MG PO TABS: 650.0000 mg | ORAL_TABLET | Freq: Four times a day (QID) | ORAL | Status: DC | PRN

## 2019-11-28 MED ORDER — HYDROCOD POLST-CPM POLST ER 10-8 MG/5ML PO SUER: 5.0000 mL | Freq: Two times a day (BID) | ORAL | Status: DC | PRN

## 2019-11-28 MED ORDER — LEVOFLOXACIN IN D5W 750 MG/150ML IV SOLN
750.0000 mg | Freq: Once | INTRAVENOUS | Status: DC
Start: 2019-11-28 — End: 2019-11-28

## 2019-11-28 MED ORDER — ONDANSETRON HCL 4 MG/2ML IJ SOLN: 4.0000 mg | Freq: Four times a day (QID) | INTRAMUSCULAR | Status: DC | PRN

## 2019-11-28 MED ORDER — ACETAMINOPHEN 650 MG RE SUPP: 650.0000 mg | Freq: Four times a day (QID) | RECTAL | Status: DC | PRN

## 2019-11-28 NOTE — ED Notes (Signed)
Pt repositioned in bed with pillow placed behind his head. Breakfast tray sat up for pt. Wife at bedside assisting pt to eat. Call light within reach. Pt has no further needs at this time.

## 2019-11-28 NOTE — Progress Notes (Signed)
[facility]  - [place] at [facility]   PATIENT NAME: Jerry Barrett    MR#:  [id number]  DATE OF BIRTH:  December 06, 1932  SUBJECTIVE:  patient is very hard on hearing. He does not have his hearing aid. Unable to give any history review of systems. Wife at bedside and discussed with her patient came in with fever 101 and altered mental status, weakness and poor PO intake per wife for 1 to 2 days.  Patient hemodynamically stable. Ate good lunch.  REVIEW OF SYSTEMS:   Review of Systems  Constitutional: Negative for chills, fever and weight loss.  HENT: Positive for hearing loss. Negative for ear discharge, ear pain and nosebleeds.   Eyes: Negative for blurred vision, pain and discharge.  Respiratory: Negative for sputum production, shortness of breath, wheezing and stridor.   Cardiovascular: Negative for chest pain, palpitations, orthopnea and PND.  Gastrointestinal: Negative for abdominal pain, diarrhea, nausea and vomiting.  Genitourinary: Negative for frequency and urgency.  Musculoskeletal: Negative for back pain and joint pain.  Neurological: Positive for weakness. Negative for sensory change, speech change and focal weakness.  Psychiatric/Behavioral: Negative for depression and hallucinations. The patient is not nervous/anxious.    Tolerating Diet:weakness Tolerating PT: pending  Patient ambulates by himself. He is able to carry out ADLs without difficulty.  DRUG ALLERGIES:   Allergies  Allergen Reactions  . Cephalexin Swelling and Rash    VITALS:  Blood pressure (!) 143/70, pulse 66, temperature 99.2 F (37.3 C), temperature source Oral, resp. rate 18, height 6' (1.829 m), weight 74.8 kg, SpO2 98 %.  PHYSICAL EXAMINATION:   Physical Exam  GENERAL:  86 y.o.-year-old patient lying in the bed with no acute distress.  HEENT: Head atraumatic, normocephalic. Oropharynx and nasopharynx clear. Hard on hearing LUNGS: Normal breath sounds bilaterally, no wheezing,  rales, rhonchi. No use of accessory muscles of respiration.  CARDIOVASCULAR: S1, S2 normal. No murmurs, rubs, or gallops.  ABDOMEN: Soft, nontender, nondistended. Bowel sounds present. No organomegaly or mass.  EXTREMITIES: No cyanosis, clubbing or edema b/l.    NEUROLOGIC: Cranial nerves II through XII are intact. No focal Motor or sensory deficits b/l.   PSYCHIATRIC:  patient is alert and awake.  SKIN: No obvious rash, lesion, or ulcer.   LABORATORY PANEL:  CBC Recent Labs  Lab 11/28/19 1002  WBC 16.0*  HGB 11.6*  HCT 33.1*  PLT 162    Chemistries  Recent Labs  Lab 11/27/19 2015 11/27/19 2015 11/28/19 0651  NA 139   < > 138  K 3.7   < > 3.5  CL 106   < > 109  CO2 25   < > 23  GLUCOSE 113*   < > 95  BUN 20   < > 18  CREATININE 1.63*   < > 1.45*  CALCIUM 8.2*   < > 7.8*  AST 23  --   --   ALT 16  --   --   ALKPHOS 73  --   --   BILITOT 0.7  --   --    < > = values in this interval not displayed.   Cardiac Enzymes No results for input(s): TROPONINI in the last 168 hours. RADIOLOGY:  CT Head Wo Contrast  Result Date: 11/27/2019 CLINICAL DATA:  86 year old male with encephalopathy. EXAM: CT HEAD WITHOUT CONTRAST TECHNIQUE: Contiguous axial images were obtained from the base of the skull through the vertex without intravenous contrast. COMPARISON:  Head CT dated 10/31/2019. FINDINGS: Brain:  Mild age-related atrophy and chronic microvascular ischemic changes. There is no acute intracranial hemorrhage. No mass effect or midline shift. No extra-axial fluid collection. Vascular: No hyperdense vessel or unexpected calcification. Skull: Normal. Negative for fracture or focal lesion. Sinuses/Orbits: No acute finding. Other: None IMPRESSION: 1. No acute intracranial pathology. 2. Mild age-related atrophy and chronic microvascular ischemic changes. Electronically Signed   By: Elgie Collard M.D.   On: 11/27/2019 22:33   DG Chest Port 1 View  Result Date: 11/27/2019 CLINICAL DATA:   Urinary tract infection. EXAM: PORTABLE CHEST 1 VIEW COMPARISON:  September 24, 2019 FINDINGS: Decreased lung volumes are seen which is likely secondary to the degree of patient inspiration. Very mild left basilar atelectasis and/or infiltrate is noted. There is no evidence of a pleural effusion or pneumothorax. The heart size and mediastinal contours are within normal limits. There is marked severity calcification of the aortic arch. The visualized skeletal structures are unremarkable. IMPRESSION: Very mild left basilar atelectasis and/or infiltrate. Electronically Signed   By: Aram Candela M.D.   On: 11/27/2019 20:40   CT Renal Stone Study  Result Date: 11/27/2019 CLINICAL DATA:  Urinary tract infection. EXAM: CT ABDOMEN AND PELVIS WITHOUT CONTRAST TECHNIQUE: Multidetector CT imaging of the abdomen and pelvis was performed following the standard protocol without IV contrast. COMPARISON:  September 22, 2018 FINDINGS: Lower chest: Mild atelectasis is seen within the posterior aspects of the bilateral lung bases. Hepatobiliary: No focal liver abnormality is seen. No gallstones, gallbladder wall thickening, or biliary dilatation. Pancreas: Unremarkable. No pancreatic ductal dilatation or surrounding inflammatory changes. Spleen: Normal in size without focal abnormality. Adrenals/Urinary Tract: Adrenal glands are unremarkable. Kidneys are normal in size, without renal calculi or focal lesions. Mild bilateral hydronephrosis and proximal hydroureter are seen. Bladder is unremarkable. Stomach/Bowel: There is a moderate-sized fat containing hiatal hernia. Stomach is within normal limits. Appendix appears normal. No evidence of bowel wall thickening, distention, or inflammatory changes. Vascular/Lymphatic: There is marked severity calcification of the abdominal aorta and bilateral common iliac arteries. No enlarged abdominal or pelvic lymph nodes. Reproductive: There is mild to moderate severity enlargement of the  prostate gland. Other: No abdominal wall hernia or abnormality. No abdominopelvic ascites. Musculoskeletal: Multilevel degenerative changes are seen throughout the lumbar spine. IMPRESSION: 1. Mild bilateral hydronephrosis and proximal hydroureter without evidence of renal or ureteral calculi. 2. Moderate-sized fat containing hiatal hernia. 3. Mild to moderate severity enlargement of the prostate gland. 4. Aortic atherosclerosis. Aortic Atherosclerosis (ICD10-I70.0). Electronically Signed   By: Aram Candela M.D.   On: 11/27/2019 22:35   ASSESSMENT AND PLAN:  Jerry Barrett  is a 86 y.o. male with a known history of dementia, type 2 diabetes mellitus, hypertension and CVA, who presented to the emergency room with acute onset of tactile fever and chills with associated recent urinary urgency without dysuria or oliguria or hematuria or flank pain.  No chest pain or dyspnea however is been having recent cough that is mainly dry without significant wheezing. Upon presentation to the emergency room, heart rate was 105 respiratory rate was 21 with a temperature of 101   1.  Sepsis with acute on chronic renal failure stage IIIB prerenal azotemia and secondary to community-acquired pneumonia. --Continue antibiotic therapy with IV Levaquin--renal dose -- patient had urine culture there was positive for E. coli. He had he had developed some rash and swelling. Stopped Keflex. Amoxicillin was given and he finished the prescription. -Mucolytic therapy,Bronchodilator therapy prn -Pro calcitonin .77  2.  Type  2 diabetes mellitus without complications. -We will continue Glucotrol XL and place the patient on supplemental coverage with NovoLog. -Metformin will be held off-- due to creatinine  3.  Hypertension. -We will continue Zestril.  4.  Hypothyroidism. -We will check TSH and continue Synthroid.  5.  DVT prophylaxis. -Subcutaneous Lovenox.  6. Acute on chronic renal failure stage IIIB -continue IV  fluids -monitor input output -baseline creatinine around 1.3 -came in with creatinine of 1.6--- down to 1.45  TOC for discharge planning CODE STATUS: Full code  Status is: Inpatient   Dispo: The patient is from: Home  Anticipated d/c is to: Home  Anticipated d/c date is: 1-2 days  Patient currently is not medically stable to d/c. Admitted with sepsis secondary to community acquired pneumonia and possible UTI. Currently getting IV fluids and IV antibiotics. Physical therapy pending.  Discussed with patient's wife in the room. Patient is a DNR DNI per wife       TOTAL TIME TAKING CARE OF THIS PATIENT: *30 minutes.  >50% time spent on counselling and coordination of care  Note: This dictation was prepared with Dragon dictation along with smaller phrase technology. Any transcriptional errors that result from this process are unintentional.  Fritzi Mandes M.D    [facility]   CC: Primary care physician; Shari Prows, [facility]Patient ID: Jerry Barrett, male   DOB: 1932/11/06, 86 y.o.   MRN: [id number]

## 2019-11-28 NOTE — ED Notes (Signed)
Patient attempting to get out of the bed.  Wife at bedside.  This RN re-oriented patient and wife to call bell and assisted patient to become more comfortable in the bed.  Also changed patient's incontinence pad.  Patient resting quietly at this time.

## 2019-11-28 NOTE — Progress Notes (Signed)
Pharmacy Antibiotic Note  Jerry Barrett is a 86 y.o. male admitted on 11/27/2019 with UTI.  Pharmacy has been consulted for ceftriaxone dosing. Patient has a rash allx to cephalexin but has received ceftriaxone back on 10/31/19 w/o documentation of any issue.  Plan: Will start patient ceftriaxone 1g IV q24h for pyelonephritis  Height: 6' (182.9 cm) Weight: 74.8 kg (164 lb 15.9 oz) IBW/kg (Calculated) : 77.6  Temp (24hrs), Avg:99.5 F (37.5 C), Min:98.5 F (36.9 C), Max:101 F (38.3 C)  Recent Labs  Lab 11/27/19 2015 11/27/19 2257  WBC 13.0*  --   CREATININE 1.63*  --   LATICACIDVEN 1.9 1.7    Estimated Creatinine Clearance: 35.1 mL/min (A) (by C-G formula based on SCr of 1.63 mg/dL (H)).    Allergies  Allergen Reactions  . Cephalexin Swelling and Rash    Thank you for allowing pharmacy to be a part of this patient's care.  Thomasene Ripple, PharmD, BCPS Clinical Pharmacist 11/28/2019 1:49 AM

## 2019-11-28 NOTE — H&P (Addendum)
[place] at [facility]   PATIENT NAME: Jerry Barrett    MR#:  [id number]  DATE OF BIRTH:  1932-12-06  DATE OF ADMISSION:  11/27/2019  PRIMARY CARE PHYSICIAN: [facility]   REQUESTING/REFERRING PHYSICIAN: Shaune Pollack, MD CHIEF COMPLAINT:   Chief Complaint  Patient presents with  . Altered Mental Status    HISTORY OF PRESENT ILLNESS:  Pleas Carneal  is a 86 y.o. male with a known history of dementia, type 2 diabetes mellitus, hypertension and CVA, who presented to the emergency room with acute onset of tactile fever and chills with associated recent urinary urgency without dysuria or oliguria or hematuria or flank pain.  No chest pain or dyspnea however is been having recent cough that is mainly dry without significant wheezing.  He was febrile here in the ER with a temperature of 99.7 and later 101.  No nausea or vomiting or abdominal pain.  Upon presentation to the emergency room, heart rate was 105 respiratory rate was 21 with a temperature of 101.  Labs were remarkable for creatinine of 1.63 albumin of 3.3 and leukocytosis 13 with neutrophilia and lactic acid of 1.9.  Urinalysis was unremarkable and urine culture was sent earlier.  Blood cultures were sent.  The patient's heart rate was 105 and respiratory 21 portable chest x-ray showed very mild left basal atelectasis and/or infiltrate.  The patient was given 650 mg of PR Tylenol as well as IV Zosyn and 1 L bolus of IV normal saline.  He will be admitted to a progressive unit bed for further evaluation and management PAST MEDICAL HISTORY:   Past Medical History:  Diagnosis Date  . Dementia (HCC)   . Diabetes mellitus without complication (HCC)   . Hyperglycemia   . Hypertension   . Stroke (HCC)   . Thyroid disease     PAST SURGICAL HISTORY:   Past Surgical History:  Procedure Laterality Date  . EYE SURGERY      SOCIAL HISTORY:   Social History   Tobacco Use  . Smoking status: Never  Smoker  . Smokeless tobacco: Never Used  Substance Use Topics  . Alcohol use: No    FAMILY HISTORY:  No reported pertinent familial diseases.  DRUG ALLERGIES:   Allergies  Allergen Reactions  . Cephalexin Swelling and Rash    REVIEW OF SYSTEMS:   ROS As per history of present illness. All pertinent systems were reviewed above. Constitutional,  HEENT, cardiovascular, respiratory, GI, GU, musculoskeletal, neuro, psychiatric, endocrine,  integumentary and hematologic systems were reviewed and are otherwise  negative/unremarkable except for positive findings mentioned above in the HPI.   MEDICATIONS AT HOME:   Prior to Admission medications   Medication Sig Start Date End Date Taking? Authorizing Provider  glipiZIDE (GLUCOTROL XL) 2.5 MG 24 hr tablet Take 2.5 mg by mouth daily with breakfast.  10/21/19 10/20/20 Yes [provider]  levothyroxine (SYNTHROID, LEVOTHROID) 50 MCG tablet Take 50 mcg by mouth daily before breakfast.   Yes [provider]  lisinopril (ZESTRIL) 2.5 MG tablet Take 2.5 mg by mouth daily. 11/24/19  Yes [provider]      VITAL SIGNS:  Blood pressure (!) 111/57, pulse 72, temperature 99.2 F (37.3 C), temperature source Oral, resp. rate 15, height 6' (1.829 m), weight 74.8 kg, SpO2 99 %.  PHYSICAL EXAMINATION:  Physical Exam  GENERAL:  86 y.o.-year-old Caucasian male patient lying in the bed with no acute distress.  EYES: Pupils  equal, round, reactive to light and accommodation. No scleral icterus. Extraocular muscles intact.  HEENT: Head atraumatic, normocephalic. Oropharynx and nasopharynx clear.  NECK:  Supple, no jugular venous distention. No thyroid enlargement, no tenderness.  LUNGS: Slightly diminished bibasal breath sounds. CARDIOVASCULAR: Regular rate and rhythm, S1, S2 normal. No murmurs, rubs, or gallops.  ABDOMEN: Soft, nondistended, nontender. Bowel sounds present. No organomegaly or mass.  EXTREMITIES: No pedal  edema, cyanosis, or clubbing.  NEUROLOGIC: Cranial nerves II through XII are intact. Muscle strength 5/5 in all extremities. Sensation intact. Gait not checked.  PSYCHIATRIC: The patient is alert and oriented x 3.  Normal affect and good eye contact. SKIN: No obvious rash, lesion, or ulcer.   LABORATORY PANEL:   CBC Recent Labs  Lab 11/27/19 2015  WBC 13.0*  HGB 12.4*  HCT 34.9*  PLT 186   ------------------------------------------------------------------------------------------------------------------  Chemistries  Recent Labs  Lab 11/27/19 2015  NA 139  K 3.7  CL 106  CO2 25  GLUCOSE 113*  BUN 20  CREATININE 1.63*  CALCIUM 8.2*  AST 23  ALT 16  ALKPHOS 73  BILITOT 0.7   ------------------------------------------------------------------------------------------------------------------  Cardiac Enzymes No results for input(s): TROPONINI in the last 168 hours. ------------------------------------------------------------------------------------------------------------------  RADIOLOGY:  CT Head Wo Contrast  Result Date: 11/27/2019 CLINICAL DATA:  86 year old male with encephalopathy. EXAM: CT HEAD WITHOUT CONTRAST TECHNIQUE: Contiguous axial images were obtained from the base of the skull through the vertex without intravenous contrast. COMPARISON:  Head CT dated 10/31/2019. FINDINGS: Brain: Mild age-related atrophy and chronic microvascular ischemic changes. There is no acute intracranial hemorrhage. No mass effect or midline shift. No extra-axial fluid collection. Vascular: No hyperdense vessel or unexpected calcification. Skull: Normal. Negative for fracture or focal lesion. Sinuses/Orbits: No acute finding. Other: None IMPRESSION: 1. No acute intracranial pathology. 2. Mild age-related atrophy and chronic microvascular ischemic changes. Electronically Signed   By: Anner Crete M.D.   On: 11/27/2019 22:33   DG Chest Port 1 View  Result Date: 11/27/2019 CLINICAL  DATA:  Urinary tract infection. EXAM: PORTABLE CHEST 1 VIEW COMPARISON:  September 24, 2019 FINDINGS: Decreased lung volumes are seen which is likely secondary to the degree of patient inspiration. Very mild left basilar atelectasis and/or infiltrate is noted. There is no evidence of a pleural effusion or pneumothorax. The heart size and mediastinal contours are within normal limits. There is marked severity calcification of the aortic arch. The visualized skeletal structures are unremarkable. IMPRESSION: Very mild left basilar atelectasis and/or infiltrate. Electronically Signed   By: Virgina Norfolk M.D.   On: 11/27/2019 20:40   CT Renal Stone Study  Result Date: 11/27/2019 CLINICAL DATA:  Urinary tract infection. EXAM: CT ABDOMEN AND PELVIS WITHOUT CONTRAST TECHNIQUE: Multidetector CT imaging of the abdomen and pelvis was performed following the standard protocol without IV contrast. COMPARISON:  September 22, 2018 FINDINGS: Lower chest: Mild atelectasis is seen within the posterior aspects of the bilateral lung bases. Hepatobiliary: No focal liver abnormality is seen. No gallstones, gallbladder wall thickening, or biliary dilatation. Pancreas: Unremarkable. No pancreatic ductal dilatation or surrounding inflammatory changes. Spleen: Normal in size without focal abnormality. Adrenals/Urinary Tract: Adrenal glands are unremarkable. Kidneys are normal in size, without renal calculi or focal lesions. Mild bilateral hydronephrosis and proximal hydroureter are seen. Bladder is unremarkable. Stomach/Bowel: There is a moderate-sized fat containing hiatal hernia. Stomach is within normal limits. Appendix appears normal. No evidence of bowel wall thickening, distention, or inflammatory changes. Vascular/Lymphatic: There is marked severity calcification of the  abdominal aorta and bilateral common iliac arteries. No enlarged abdominal or pelvic lymph nodes. Reproductive: There is mild to moderate severity enlargement of the  prostate gland. Other: No abdominal wall hernia or abnormality. No abdominopelvic ascites. Musculoskeletal: Multilevel degenerative changes are seen throughout the lumbar spine. IMPRESSION: 1. Mild bilateral hydronephrosis and proximal hydroureter without evidence of renal or ureteral calculi. 2. Moderate-sized fat containing hiatal hernia. 3. Mild to moderate severity enlargement of the prostate gland. 4. Aortic atherosclerosis. Aortic Atherosclerosis (ICD10-I70.0). Electronically Signed   By: Aram Candela M.D.   On: 11/27/2019 22:35      IMPRESSION AND PLAN:  1.  Sepsis secondary to community-acquired pneumonia. -The patient will be admitted to a progressive unit bed. -Continue antibiotic therapy with IV Levaquin. -Mucolytic therapy will be provided. -Bronchodilator therapy will be provided. -We will follow sputum and blood cultures.  2.  Type 2 diabetes mellitus without complications. -We will continue Glucotrol XL and place the patient on supplemental coverage with NovoLog. -Metformin will be held off.  3.  Hypertension. -We will continue Zestril.  4.  Hypothyroidism. -We will check TSH and continue Synthroid.  5.  DVT prophylaxis. -Subcutaneous Lovenox.   All the records are reviewed and case discussed with ED provider. The plan of care was discussed in details with the patient (and family). I answered all questions. The patient agreed to proceed with the above mentioned plan. Further management will depend upon hospital course.   CODE STATUS: The patient is DNR/DNI.  Status is: Inpatient  Remains inpatient appropriate because:Ongoing diagnostic testing needed not appropriate for outpatient work up, Unsafe d/c plan, IV treatments appropriate due to intensity of illness or inability to take PO and Inpatient level of care appropriate due to severity of illness   Dispo: The patient is from: Home              Anticipated d/c is to: Home              Anticipated d/c date  is: 2 days              Patient currently is not medically stable to d/c.    TOTAL TIME TAKING CARE OF THIS PATIENT: 55  minutes.    Hannah Beat M.D on 11/28/2019 at 4:38 AM  [facility]   From 7 PM-7 AM, contact night-coverage [email or web address]  CC: Primary care physician; Dan Humphreys, [facility]   Note: This dictation was prepared with Dragon dictation along with smaller phrase technology. Any transcriptional typo errors that result from this process are unintentional.

## 2019-11-29 LAB — GLUCOSE, CAPILLARY
Glucose-Capillary: 256 mg/dL — ABNORMAL HIGH (ref 70–99)
Glucose-Capillary: 213 mg/dL — ABNORMAL HIGH (ref 70–99)
Glucose-Capillary: 198 mg/dL — ABNORMAL HIGH (ref 70–99)
Glucose-Capillary: 130 mg/dL — ABNORMAL HIGH (ref 70–99)

## 2019-11-29 LAB — LEGIONELLA PNEUMOPHILA SEROGP 1 UR AG: L. pneumophila Serogp 1 Ur Ag: NEGATIVE

## 2019-11-29 MED ORDER — HALOPERIDOL LACTATE 5 MG/ML IJ SOLN
5.0000 mg | INTRAMUSCULAR | Status: AC
  Administered 2019-11-29: 5 mg via INTRAVENOUS
  Filled 2019-11-29: qty 1

## 2019-11-29 NOTE — Progress Notes (Signed)
Pt became very confused and agitated. Pt making verbal threat toward staff members. Pt gram Urinal with urine inside and attempt to throw it on staff. Primary nurse informed Steward Drone NP and called code 300. Orders received for Haldol 5 mg once. Medication given. Pt remained agitated. Steward Drone NP was notified one more and orders were received for another dose of Haldol and a 1 to 1 sitter. Primary nurse to continue to monitor.

## 2019-11-29 NOTE — Progress Notes (Signed)
[facility]  - [place] at [facility]   PATIENT NAME: Jerry Barrett    MR#:  [id number]  DATE OF BIRTH:  1932/06/29  SUBJECTIVE:  patient is very hard on hearing. He does have his left hearing aid. Unable to give any history review of systems. Wife at bedside and discussed   Patient hemodynamically stable. Per nursing notes patient was confused last night. Agitated. This happened after his wife left to go home. Sitter overnight. Right now calm. Denies any complaints.  REVIEW OF SYSTEMS:   Review of Systems  Constitutional: Negative for chills, fever and weight loss.  HENT: Positive for hearing loss. Negative for ear discharge, ear pain and nosebleeds.   Eyes: Negative for blurred vision, pain and discharge.  Respiratory: Negative for sputum production, shortness of breath, wheezing and stridor.   Cardiovascular: Negative for chest pain, palpitations, orthopnea and PND.  Gastrointestinal: Negative for abdominal pain, diarrhea, nausea and vomiting.  Genitourinary: Negative for frequency and urgency.  Musculoskeletal: Negative for back pain and joint pain.  Neurological: Positive for weakness. Negative for sensory change, speech change and focal weakness.  Psychiatric/Behavioral: Negative for depression and hallucinations. The patient is not nervous/anxious.    Tolerating Diet:weakness Tolerating PT: pending  Patient ambulates by himself. He is able to carry out ADLs without difficulty.  DRUG ALLERGIES:   Allergies  Allergen Reactions  . Cephalexin Swelling and Rash    VITALS:  Blood pressure (!) 147/80, pulse 82, temperature 97.7 F (36.5 C), resp. rate 18, height 6' (1.829 m), weight 74.8 kg, SpO2 96 %.  PHYSICAL EXAMINATION:   Physical Exam  GENERAL:  86 y.o.-year-old patient lying in the bed with no acute distress.  HEENT: Head atraumatic, normocephalic. Oropharynx and nasopharynx clear. Hard on hearing LUNGS: Normal breath sounds bilaterally, no  wheezing, rales, rhonchi. No use of accessory muscles of respiration.  CARDIOVASCULAR: S1, S2 normal. No murmurs, rubs, or gallops.  ABDOMEN: Soft, nontender, nondistended. Bowel sounds present. No organomegaly or mass.  EXTREMITIES: No cyanosis, clubbing or edema b/l.    NEUROLOGIC: Cranial nerves II through XII are intact. No focal Motor or sensory deficits b/l.   PSYCHIATRIC:  patient is alert and awake.  SKIN: No obvious rash, lesion, or ulcer.   LABORATORY PANEL:  CBC Recent Labs  Lab 11/28/19 1002  WBC 16.0*  HGB 11.6*  HCT 33.1*  PLT 162    Chemistries  Recent Labs  Lab 11/27/19 2015 11/27/19 2015 11/28/19 0651  NA 139   < > 138  K 3.7   < > 3.5  CL 106   < > 109  CO2 25   < > 23  GLUCOSE 113*   < > 95  BUN 20   < > 18  CREATININE 1.63*   < > 1.45*  CALCIUM 8.2*   < > 7.8*  AST 23  --   --   ALT 16  --   --   ALKPHOS 73  --   --   BILITOT 0.7  --   --    < > = values in this interval not displayed.   Cardiac Enzymes No results for input(s): TROPONINI in the last 168 hours. RADIOLOGY:  CT Head Wo Contrast  Result Date: 11/27/2019 CLINICAL DATA:  86 year old male with encephalopathy. EXAM: CT HEAD WITHOUT CONTRAST TECHNIQUE: Contiguous axial images were obtained from the base of the skull through the vertex without intravenous contrast. COMPARISON:  Head CT dated 10/31/2019. FINDINGS: Brain: Mild age-related atrophy  and chronic microvascular ischemic changes. There is no acute intracranial hemorrhage. No mass effect or midline shift. No extra-axial fluid collection. Vascular: No hyperdense vessel or unexpected calcification. Skull: Normal. Negative for fracture or focal lesion. Sinuses/Orbits: No acute finding. Other: None IMPRESSION: 1. No acute intracranial pathology. 2. Mild age-related atrophy and chronic microvascular ischemic changes. Electronically Signed   By: Anner Crete M.D.   On: 11/27/2019 22:33   DG Chest Port 1 View  Result Date:  11/27/2019 CLINICAL DATA:  Urinary tract infection. EXAM: PORTABLE CHEST 1 VIEW COMPARISON:  September 24, 2019 FINDINGS: Decreased lung volumes are seen which is likely secondary to the degree of patient inspiration. Very mild left basilar atelectasis and/or infiltrate is noted. There is no evidence of a pleural effusion or pneumothorax. The heart size and mediastinal contours are within normal limits. There is marked severity calcification of the aortic arch. The visualized skeletal structures are unremarkable. IMPRESSION: Very mild left basilar atelectasis and/or infiltrate. Electronically Signed   By: Virgina Norfolk M.D.   On: 11/27/2019 20:40   CT Renal Stone Study  Result Date: 11/27/2019 CLINICAL DATA:  Urinary tract infection. EXAM: CT ABDOMEN AND PELVIS WITHOUT CONTRAST TECHNIQUE: Multidetector CT imaging of the abdomen and pelvis was performed following the standard protocol without IV contrast. COMPARISON:  September 22, 2018 FINDINGS: Lower chest: Mild atelectasis is seen within the posterior aspects of the bilateral lung bases. Hepatobiliary: No focal liver abnormality is seen. No gallstones, gallbladder wall thickening, or biliary dilatation. Pancreas: Unremarkable. No pancreatic ductal dilatation or surrounding inflammatory changes. Spleen: Normal in size without focal abnormality. Adrenals/Urinary Tract: Adrenal glands are unremarkable. Kidneys are normal in size, without renal calculi or focal lesions. Mild bilateral hydronephrosis and proximal hydroureter are seen. Bladder is unremarkable. Stomach/Bowel: There is a moderate-sized fat containing hiatal hernia. Stomach is within normal limits. Appendix appears normal. No evidence of bowel wall thickening, distention, or inflammatory changes. Vascular/Lymphatic: There is marked severity calcification of the abdominal aorta and bilateral common iliac arteries. No enlarged abdominal or pelvic lymph nodes. Reproductive: There is mild to moderate severity  enlargement of the prostate gland. Other: No abdominal wall hernia or abnormality. No abdominopelvic ascites. Musculoskeletal: Multilevel degenerative changes are seen throughout the lumbar spine. IMPRESSION: 1. Mild bilateral hydronephrosis and proximal hydroureter without evidence of renal or ureteral calculi. 2. Moderate-sized fat containing hiatal hernia. 3. Mild to moderate severity enlargement of the prostate gland. 4. Aortic atherosclerosis. Aortic Atherosclerosis (ICD10-I70.0). Electronically Signed   By: Virgina Norfolk M.D.   On: 11/27/2019 22:35   ASSESSMENT AND PLAN:  Jerry Barrett  is a 86 y.o. male with a known history of dementia, type 2 diabetes mellitus, hypertension and CVA, who presented to the emergency room with acute onset of tactile fever and chills with associated recent urinary urgency without dysuria or oliguria or hematuria or flank pain.  No chest pain or dyspnea however is been having recent cough that is mainly dry without significant wheezing. Upon presentation to the emergency room, heart rate was 105 respiratory rate was 21 with a temperature of 101   1.Sepsis with acute on chronic renal failure stage IIIB prerenal azotemia and secondary to community-acquired pneumonia. --Continue antibiotic therapy with IV Levaquin--renal dose -- patient had urine culture there was positive for E. coli. He had he had developed some rash and swelling. Stopped Keflex. Amoxicillin was given and he finished the prescription. -Mucolytic therapy,Bronchodilator therapy prn -Pro calcitonin .77  2.  Type 2 diabetes mellitus without complications. -  We will continue Glucotrol XL and place the patient on supplemental coverage with NovoLog. -Metformin will be held off-- due to creatinine  3.  Hypertension. -We will continue Zestril.  4.  Hypothyroidism. -We will check TSH and continue Synthroid.  5.  DVT prophylaxis. -Subcutaneous Lovenox.  6. Acute on chronic renal failure stage  IIIB -continue IV fluids -monitor input output -baseline creatinine around 1.3 -came in with creatinine of 1.6--- down to 1.45 \ 7. Mild confusion suspected mild cognitive decline with history of impaired hearing. -Patient became irritable agitated after his wife left -this morning very calm -will discontinue citrated present. Continue safety rounds you sitter as needed.  TOC for discharge planning CODE STATUS: Full code  Status is: Inpatient   Dispo: The patient is from: Home  Anticipated d/c is to: Home  Anticipated d/c date is: 1-2 days  Patient currently is not medically stable to d/c. Admitted with sepsis secondary to community acquired pneumonia and possible UTI. Currently getting IV fluids and IV antibiotics. Physical therapy pending.  Discussed with patient's wife in the room. Patient is a DNR DNI per wife       TOTAL TIME TAKING CARE OF THIS PATIENT: *30 minutes.  >50% time spent on counselling and coordination of care  Note: This dictation was prepared with Dragon dictation along with smaller phrase technology. Any transcriptional errors that result from this process are unintentional.  Enedina Finner M.D    [facility]   CC: Primary care physician; Dan Humphreys, [facility] CarePatient ID: Jerry Barrett, male   DOB: [birth year], 86 y.o.   MRN: [id number]

## 2019-11-29 NOTE — Evaluation (Signed)
Physical Therapy Evaluation Patient Details Name: Jerry Barrett MRN: [id number] DOB: 08/01/1932 Today's Date: 11/29/2019   History of Present Illness  Per MD notes: Pt is an 86 y.o. male with a known history of dementia, type 2 diabetes mellitus, hypertension and CVA, who presented to the emergency room with acute onset of tactile fever and chills with associated recent urinary urgency.  MD assessment includes: Sepsis with acute on chronic renal failure stage IIIB, prerenal azotemia secondary to community-acquired pneumonia, HTN, hypothyroidism, and mild confusion with suspected mild cognitive decline.    Clinical Impression  Pt pleasant and motivated to participate during the session.  Pt performed very well during the session and was Ind with bed mobility and transfers and SBA with ambulation including dynamic gait training with both a RW and without an AD with no noted instability or LOB.  Will keep patient on acute care caseload to prevent functional decline but no follow up PT services required at discharge.       Follow Up Recommendations No PT follow up    Equipment Recommendations  None recommended by PT    Recommendations for Other Services       Precautions / Restrictions Precautions Precautions: Fall Restrictions Weight Bearing Restrictions: No      Mobility  Bed Mobility Overal bed mobility: Independent             General bed mobility comments: Good speed and effort with bed mobility tasks  Transfers Overall transfer level: Modified independent Equipment used: Rolling walker (2 wheeled)             General transfer comment: Good eccentric and concentric control  Ambulation/Gait Ambulation/Gait assistance: Supervision Gait Distance (Feet): 200 Feet Assistive device: Rolling walker (2 wheeled);None Gait Pattern/deviations: Step-through pattern;Decreased step length - left;Decreased step length - right Gait velocity: mildly decreased   General Gait  Details: Pt able to amb 200+ feet with a combination of a RW and without an AD with good stability including during turns and start/stops  Careers information officer    Modified Rankin (Stroke Patients Only)       Balance Overall balance assessment: No apparent balance deficits (not formally assessed)                                           Pertinent Vitals/Pain Pain Assessment: No/denies pain    Home Living Family/patient expects to be discharged to:: Private residence Living Arrangements: Spouse/significant other Available Help at Discharge: Family;Available 24 hours/day Type of Home: House Home Access: Stairs to enter Entrance Stairs-Rails: Left Entrance Stairs-Number of Steps: 3 Home Layout: Two level;Able to live on main level with bedroom/bathroom Home Equipment: Bedside commode;Walker - 2 wheels      Prior Function Level of Independence: Needs assistance   Gait / Transfers Assistance Needed: Ind amb in the home without an AD, HHA with limited community ambulation, 1 fall in the last 6 months tripped in hole in the yard  ADL's / Homemaking Assistance Needed: Ind with dressing and with sink baths        Hand Dominance        Extremity/Trunk Assessment   Upper Extremity Assessment Upper Extremity Assessment: Overall WFL for tasks assessed    Lower Extremity Assessment Lower Extremity Assessment: Overall WFL for tasks assessed  Communication   Communication: HOH  Cognition Arousal/Alertness: Awake/alert   Overall Cognitive Status: History of cognitive impairments - at baseline                                        General Comments      Exercises Other Exercises Other Exercises: Dynamic gait training with start/stops, 180 deg turns, and head turns   Assessment/Plan    PT Assessment Patient needs continued PT services  PT Problem List Decreased strength       PT Treatment  Interventions DME instruction;Gait training;Stair training;Functional mobility training;Therapeutic activities;Therapeutic exercise;Balance training;Patient/family education    PT Goals (Current goals can be found in the Care Plan section)  Acute Rehab PT Goals PT Goal Formulation: Patient unable to participate in goal setting Time For Goal Achievement: 12/12/19 Potential to Achieve Goals: Good    Frequency Min 2X/week   Barriers to discharge        Co-evaluation               AM-PAC PT "6 Clicks" Mobility  Outcome Measure Help needed turning from your back to your side while in a flat bed without using bedrails?: None Help needed moving from lying on your back to sitting on the side of a flat bed without using bedrails?: None Help needed moving to and from a bed to a chair (including a wheelchair)?: None Help needed standing up from a chair using your arms (e.g., wheelchair or bedside chair)?: None Help needed to walk in hospital room?: A Little Help needed climbing 3-5 steps with a railing? : A Little 6 Click Score: 22    End of Session Equipment Utilized During Treatment: Gait belt Activity Tolerance: Patient tolerated treatment well Patient left: in chair;with call bell/phone within reach;with chair alarm set;with family/visitor present Nurse Communication: Mobility status PT Visit Diagnosis: Muscle weakness (generalized) (M62.81)    Time: 2130-8657 PT Time Calculation (min) (ACUTE ONLY): 32 min   Charges:   PT Evaluation $PT Eval Low Complexity: 1 Low PT Treatments $Gait Training: 8-22 mins        D. Royetta Asal PT, DPT 11/29/19, 3:55 PM

## 2019-11-30 ENCOUNTER — Other Ambulatory Visit: Payer: Self-pay

## 2019-11-30 ENCOUNTER — Emergency Department
Admission: EM | Admit: 2019-11-30 | Discharge: 2019-11-30 | Disposition: A | Discharge status: Home / Self Care | Payer: Medicare Other | Attending: Student | Admitting: Student

## 2019-11-30 ENCOUNTER — Emergency Department: Payer: Medicare Other

## 2019-11-30 DIAGNOSIS — Y998 Other external cause status: Secondary | ICD-10-CM | POA: Insufficient documentation

## 2019-11-30 DIAGNOSIS — Z79890 Hormone replacement therapy (postmenopausal): Secondary | ICD-10-CM | POA: Insufficient documentation

## 2019-11-30 DIAGNOSIS — Z79899 Other long term (current) drug therapy: Secondary | ICD-10-CM | POA: Insufficient documentation

## 2019-11-30 DIAGNOSIS — S0990XA Unspecified injury of head, initial encounter: Secondary | ICD-10-CM

## 2019-11-30 DIAGNOSIS — E119 Type 2 diabetes mellitus without complications: Secondary | ICD-10-CM | POA: Insufficient documentation

## 2019-11-30 DIAGNOSIS — E039 Hypothyroidism, unspecified: Secondary | ICD-10-CM | POA: Insufficient documentation

## 2019-11-30 DIAGNOSIS — W19XXXA Unspecified fall, initial encounter: Secondary | ICD-10-CM | POA: Insufficient documentation

## 2019-11-30 DIAGNOSIS — Y92009 Unspecified place in unspecified non-institutional (private) residence as the place of occurrence of the external cause: Secondary | ICD-10-CM | POA: Insufficient documentation

## 2019-11-30 DIAGNOSIS — Z7984 Long term (current) use of oral hypoglycemic drugs: Secondary | ICD-10-CM | POA: Insufficient documentation

## 2019-11-30 DIAGNOSIS — F0390 Unspecified dementia without behavioral disturbance: Secondary | ICD-10-CM | POA: Insufficient documentation

## 2019-11-30 DIAGNOSIS — Z8673 Personal history of transient ischemic attack (TIA), and cerebral infarction without residual deficits: Secondary | ICD-10-CM | POA: Insufficient documentation

## 2019-11-30 DIAGNOSIS — I10 Essential (primary) hypertension: Secondary | ICD-10-CM | POA: Insufficient documentation

## 2019-11-30 DIAGNOSIS — Y9389 Activity, other specified: Secondary | ICD-10-CM | POA: Insufficient documentation

## 2019-11-30 LAB — URINE CULTURE: Culture: 30000 — AB

## 2019-11-30 LAB — GLUCOSE, CAPILLARY
Glucose-Capillary: 102 mg/dL — ABNORMAL HIGH (ref 70–99)
Glucose-Capillary: 84 mg/dL (ref 70–99)

## 2019-11-30 LAB — BASIC METABOLIC PANEL
Anion gap: 9 (ref 5–15)
BUN: 25 mg/dL — ABNORMAL HIGH (ref 8–23)
CO2: 21 mmol/L — ABNORMAL LOW (ref 22–32)
Calcium: 8.7 mg/dL — ABNORMAL LOW (ref 8.9–10.3)
Chloride: 110 mmol/L (ref 98–111)
Creatinine, Ser: 1.45 mg/dL — ABNORMAL HIGH (ref 0.61–1.24)
GFR calc Af Amer: 51 mL/min — ABNORMAL LOW (ref >60)
GFR calc non Af Amer: 44 mL/min — ABNORMAL LOW (ref >60)
Glucose, Bld: 91 mg/dL (ref 70–99)
Potassium: 3.4 mmol/L — ABNORMAL LOW (ref 3.5–5.1)
Sodium: 140 mmol/L (ref 135–145)

## 2019-11-30 LAB — CBC
HCT: 33.4 % — ABNORMAL LOW (ref 39.0–52.0)
Hemoglobin: 12.3 g/dL — ABNORMAL LOW (ref 13.0–17.0)
MCH: 33.3 pg (ref 26.0–34.0)
MCHC: 36.8 g/dL — ABNORMAL HIGH (ref 30.0–36.0)
MCV: 90.5 fL (ref 80.0–100.0)
Platelets: 199 10*3/uL (ref 150–400)
RBC: 3.69 MIL/uL — ABNORMAL LOW (ref 4.22–5.81)
RDW: 13 % (ref 11.5–15.5)
WBC: 17 10*3/uL — ABNORMAL HIGH (ref 4.0–10.5)
nRBC: 0 % (ref 0.0–0.2)

## 2019-11-30 MED ORDER — LEVOFLOXACIN 750 MG PO TABS: 750.0000 mg | ORAL_TABLET | ORAL | 0 refills | Status: AC

## 2019-11-30 MED ORDER — LEVOFLOXACIN 750 MG PO TABS
750.0000 mg | ORAL_TABLET | ORAL | Status: DC
  Administered 2019-11-30: 750 mg via ORAL
  Filled 2019-11-30: qty 1

## 2019-11-30 NOTE — Progress Notes (Signed)
Patient has been difficult to redirect for entire shift. He continues to attempt leaving his bed to go places he normally goes when not in hospital. Clinical research associate uses calming voice and conversation about his hobbies/pets/retirement to engage him in conversation instead of wanting leave his bed. Will pass info on to day shift nurse.

## 2019-11-30 NOTE — ED Triage Notes (Addendum)
First nurse note- pt arrived EMS.  Was discharged previously today. Pt fell trying to get in house.  Wife elderly and difficulty assisting pt. Hit head. No LOC.  No blood thinners. VSS with EMS. AMS that wife reports is baseline.  CBG 106

## 2019-11-30 NOTE — Progress Notes (Signed)
Handoff report to [employer], Charity fundraiser

## 2019-11-30 NOTE — TOC Initial Note (Signed)
Transition of Care [facility]) - Initial/Assessment Note    Patient Details  Name: Jerry Barrett MRN: [id number] Date of Birth: [birth year]  [facility]) CM/SW Contact:    Beverly Sessions, RN Phone Number: 11/30/2019, 3:13 PM  Clinical Narrative:                 Patient admitted with sepsis Patient with history of dementia. Assessment completed with wife. Wife states patient is mobile with RW at home.  Patient also has BSC in the home.  PC [facility].  [facility] - denies issues obtaining medications.  Wife provides transportation  MD has order home health RN, and aide.  Wife states that they were recently closed with [place] and would like to use them again.  Referral made to [facility] with [place]   Expected Discharge Plan: [place] Barriers to Discharge: No Barriers Identified   Patient Goals and CMS Choice        Expected Discharge Plan and Services Expected Discharge Plan: [place]   Discharge Planning Services: CM Consult Post Acute Care Choice: [place] arrangements for the past 2 months: Single Family Home Expected Discharge Date: 11/30/19                         HH Arranged: RN, Social Work [place] Agency: Microbiologist ([place]) Date [place]: 11/30/19   Representative spoke with at [place]: Corene Cornea  Prior Living Arrangements/Services Living arrangements for the past 2 months: [place] Lives with:: Spouse Patient language and need for interpreter reviewed:: Yes Do you feel safe going back to the place where you live?: Yes      Need for Family Participation in Patient Care: Yes (Comment) Care giver support system in place?: Yes (comment) Current home services: DME Criminal Activity/Legal Involvement Pertinent to Current Situation/Hospitalization: No - Comment as needed  Activities of Daily Living Home Assistive Devices/Equipment:  Environmental consultant (specify type), Eyeglasses ADL Screening (condition at time of admission) Patient's cognitive ability adequate to safely complete daily activities?: Yes Is the patient deaf or have difficulty hearing?: Yes Does the patient have difficulty seeing, even when wearing glasses/contacts?: No Does the patient have difficulty concentrating, remembering, or making decisions?: No Patient able to express need for assistance with ADLs?: Yes Does the patient have difficulty dressing or bathing?: No Independently performs ADLs?: Yes (appropriate for developmental age) Does the patient have difficulty walking or climbing stairs?: No Weakness of Legs: None Weakness of Arms/Hands: None  Permission Sought/Granted                  Emotional Assessment           Psych Involvement: No (comment)  Admission diagnosis:  Sepsis ([place]) [A41.9] Sepsis due to gram-negative UTI ([place]) [A41.50, N39.0] Sepsis, due to unspecified organism, unspecified whether acute organ dysfunction present [facility]) [A41.9] Patient Active Problem List   Diagnosis Date Noted  . Sepsis ([place]) 11/28/2019  . Pneumonia of right lung due to infectious organism   . Acute kidney injury superimposed on CKD ([place])   . Sepsis due to gram-negative UTI ([place]) 11/27/2019  . Volume depletion   . Goals of care, counseling/discussion   . Palliative care by specialist   . Acute renal failure ([place]) 09/22/2019  . History of CVA with residual deficit 09/22/2019  . Hemiparesis and other late effects of cerebrovascular accident (  HCC) 09/22/2019  . Hyperkalemia 09/22/2019  . SIRS (systemic inflammatory response syndrome) (HCC) 07/19/2017  . Influenza A 07/19/2017  . Diabetes (HCC) 07/19/2017  . HTN (hypertension) 07/19/2017  . Hypothyroidism 07/19/2017  . HLD (hyperlipidemia) 07/19/2017   PCP:  [facility]   [facility] [address],  - [address] [place] [address] Phone:  [phone] Fax: [phone]     Social Determinants of Health (SDOH) Interventions    Readmission Risk Interventions Readmission Risk Prevention Plan 11/30/2019  Transportation Screening Complete  PCP or Specialist Appt within 3-5 Days Complete  HRI or Home Care Consult Complete  Palliative Care Screening Complete  Medication Review (RN Care Manager) Complete  Some recent data might be hidden

## 2019-11-30 NOTE — Progress Notes (Signed)
[facility] Room 219 [facility]) Hospital Liaison RN note  This patient has been referred for outpatient palliative services with [facility] upon discharge.  Please call with any questions or concerns.  Thank you. Haynes Bast, BSN, RN [facility] Liaison [phone]

## 2019-11-30 NOTE — Care Management Important Message (Signed)
Important Message  Patient Details  Name: Jerry Barrett MRN: [id number] Date of Birth: 08/30/1932   Medicare Important Message Given:  Yes     [name] 11/30/2019, 11:04 AM

## 2019-11-30 NOTE — Discharge Instructions (Addendum)
CT of the head and neck shows no acute findings.  Follow discharge care instruction continue previous medications.

## 2019-11-30 NOTE — ED Triage Notes (Signed)
Pt brought by EMS from home after falling while trying to get into house. Pt was just seen here in ED for fall earlier.  Pt has dementia. Pt denies any pain or hitting head. No LOC reported. No blood thinners. Pt is at baseline per wife. VSS

## 2019-11-30 NOTE — Discharge Summary (Addendum)
[facility] [place] [facility]   PATIENT NAME: Jerry Barrett    MR#:  [id number]  DATE OF BIRTH:  08/16/1932  DATE OF ADMISSION:  11/27/2019 ADMITTING PHYSICIAN: Jerry Finner, MD  DATE OF DISCHARGE: 11/30/2019  PRIMARY Barrett PHYSICIAN: Jerry Barrett    ADMISSION DIAGNOSIS:  Sepsis (HCC) [A41.9] Sepsis due to gram-negative UTI (HCC) [A41.50, N39.0] Sepsis, due to unspecified organism, unspecified whether acute organ dysfunction present (HCC) [A41.9]  DISCHARGE DIAGNOSIS:  Sepsis POA--improved Community acquired pneumonia Pseudomonas UTI  SECONDARY DIAGNOSIS:   Past Medical History:  Diagnosis Date  . Dementia (HCC)   . Diabetes mellitus without complication (HCC)   . Hyperglycemia   . Hypertension   . Stroke (HCC)   . Thyroid disease     HOSPITAL COURSE:   DonaldDixonis a87 y.o.malewith a known history of dementia, type 2 diabetes mellitus, hypertension and CVA, who presented to the emergency room with acute onset of tactile fever and chills with associated recent urinary urgency without dysuria or oliguria or hematuria or flank pain. No chest pain or dyspnea however is been having recent cough that is mainly dry without significant wheezing. Upon presentation to the emergency room, heart rate was 105 respiratory rate was 21 with a temperature of 101   1.Sepsis with acute on chronic renal failure stage IIIB prerenal azotemia and secondary to community-acquired pneumonia. -Continue antibiotic therapy with Levaquin--renal dose (total 7 days) -Mucolytic therapy,Bronchodilator therapy prn -Pro calcitonin .77 sats stable at RA -no fever -overall much improved -WBC 17K  2. Type 2 diabetes mellitus without complications. -continue Glucotrol XL and on supplemental coverage with NovoLog. -Metformin will be held off-- due to creatinine  3. Hypertension. -continue Zestril.  4. Hypothyroidism. - continue Synthroid.  5. DVT  prophylaxis. -Subcutaneous Lovenox.  6. Acute on chronic renal failure stage IIIB -continue IV fluids -monitor input output -baseline creatinine around 1.3 -came in with creatinine of 1.6--- down to 1.45  7. Mild confusion suspected mild cognitive decline with history of impaired hearing. -Patient became irritable agitated after his wife left -this morning very calm - Continue safety rounds you sitter as needed.  8.UTI --patient recently in may 2021 had urine culture there was positive for E. coli. He had he had developed some rash and swelling. Stopped Keflex. Amoxicillin was given and he finished the prescription. -UC pseudomonas 30 K-- levaquin should cover  NO PT needs per evaluation.  Patient will benefit from outpt Palliative Barrett follow up which will be set up.  TOC for discharge planning CODE STATUS: Full code  Status is: Inpatient   Dispo: The patient is from:Home Anticipated d/c is EE:FEOF Anticipated d/c date is: today Discussed with patient's wife in the room. Patient is a DNR DNI per wife   d/c home today.  CONSULTS OBTAINED:    DRUG ALLERGIES:   Allergies  Allergen Reactions  . Cephalexin Swelling and Rash    DISCHARGE MEDICATIONS:   Allergies as of 11/30/2019      Reactions   Cephalexin Swelling, Rash      Medication List    TAKE these medications   glipiZIDE 2.5 MG 24 hr tablet Commonly known as: GLUCOTROL XL Take 2.5 mg by mouth daily with breakfast.   levofloxacin 750 MG tablet Commonly known as: LEVAQUIN Take 1 tablet (750 mg total) by mouth every other day for 3 doses.   levothyroxine 50 MCG tablet Commonly known as: SYNTHROID Take 50 mcg by mouth daily before breakfast.  lisinopril 2.5 MG tablet Commonly known as: ZESTRIL Take 2.5 mg by mouth daily.       If you experience worsening of your admission symptoms, develop shortness of breath, life threatening emergency, suicidal or  homicidal thoughts you must seek medical attention immediately by calling 911 or calling your MD immediately  if symptoms less severe.  You Must read complete instructions/literature along with all the possible adverse reactions/side effects for all the Medicines you take and that have been prescribed to you. Take any new Medicines after you have completely understood and accept all the possible adverse reactions/side effects.   Please note  You were cared for by a hospitalist during your hospital stay. If you have any questions about your discharge medications or the Barrett you received while you were in the hospital after you are discharged, you can call the unit and asked to speak with the hospitalist on call if the hospitalist that took Barrett of you is not available. Once you are discharged, your primary Barrett physician will handle any further medical issues. Please note that NO REFILLS for any discharge medications will be authorized once you are discharged, as it is imperative that you return to your primary Barrett physician (or establish a relationship with a primary Barrett physician if you do not have one) for your aftercare needs so that they can reassess your need for medications and monitor your lab values. Today   SUBJECTIVE   Awake and alert. Pleasant confusion Eating breakfast  VITAL SIGNS:  Blood pressure (!) 167/86, pulse 87, temperature 98.2 F (36.8 C), temperature source Oral, resp. rate 16, height 6' (1.829 m), weight 74.8 kg, SpO2 98 %.  I/O:    Intake/Output Summary (Last 24 hours) at 11/30/2019 0831 Last data filed at 11/30/2019 0600 Gross per 24 hour  Intake 1099.67 ml  Output 750 ml  Net 349.67 ml    PHYSICAL EXAMINATION:  GENERAL:  86 y.o.-year-old patient lying in the bed with no acute distress.  EYES: Pupils equal, round, reactive to light and accommodation. No scleral icterus.  HEENT: Head atraumatic, normocephalic. Oropharynx and nasopharynx clear.  NECK:  Supple,  no jugular venous distention. No thyroid enlargement, no tenderness.  LUNGS: Normal breath sounds bilaterally, no wheezing, rales,rhonchi or crepitation. No use of accessory muscles of respiration.  CARDIOVASCULAR: S1, S2 normal. No murmurs, rubs, or gallops.  ABDOMEN: Soft, non-tender, non-distended. Bowel sounds present. No organomegaly or mass.  EXTREMITIES: No pedal edema, cyanosis, or clubbing.  NEUROLOGIC: Cranial nerves II through XII are intact. Muscle strength 5/5 in all extremities. Sensation intact. Gait not checked.  PSYCHIATRIC: patient is alert and awake. Some baseline confusion SKIN: No obvious rash, lesion, or ulcer.   DATA REVIEW:   CBC  Recent Labs  Lab 11/30/19 0456  WBC 17.0*  HGB 12.3*  HCT 33.4*  PLT 199    Chemistries  Recent Labs  Lab 11/27/19 2015 11/28/19 0651 11/30/19 0456  NA 139   < > 140  K 3.7   < > 3.4*  CL 106   < > 110  CO2 25   < > 21*  GLUCOSE 113*   < > 91  BUN 20   < > 25*  CREATININE 1.63*   < > 1.45*  CALCIUM 8.2*   < > 8.7*  AST 23  --   --   ALT 16  --   --   ALKPHOS 73  --   --   BILITOT 0.7  --   --    < > =  values in this interval not displayed.    Microbiology Results   Recent Results (from the past 240 hour(s))  Blood Culture (routine x 2)     Status: None (Preliminary result)   Collection Time: 11/27/19  8:15 PM   Specimen: BLOOD  Result Value Ref Range Status   Specimen Description BLOOD RIGHT [facility]  Final   Special Requests   Final    BOTTLES DRAWN AEROBIC AND ANAEROBIC Blood Culture adequate volume   Culture   Final    NO GROWTH 3 DAYS Performed at [facility], [address], [place], [place] [address]    Report Status PENDING  Incomplete  Blood Culture (routine x 2)     Status: None (Preliminary result)   Collection Time: 11/27/19  8:19 PM   Specimen: BLOOD  Result Value Ref Range Status   Specimen Description BLOOD LEFT FA  Final   Special Requests   Final    BOTTLES DRAWN AEROBIC AND  ANAEROBIC Blood Culture adequate volume   Culture   Final    NO GROWTH 3 DAYS Performed at [facility], [address], [place], [place] [address]    Report Status PENDING  Incomplete  Urine culture     Status: Abnormal   Collection Time: 11/27/19  8:19 PM   Specimen: In/Out Cath Urine  Result Value Ref Range Status   Specimen Description   Final    IN/OUT CATH URINE Performed at [facility], [address], [place], [place] [address]    Special Requests   Final    NONE Performed at [facility], [address] [place], [place] [address]    Culture 30,000 COLONIES/mL PSEUDOMONAS AERUGINOSA (A)  Final   Report Status 11/30/2019 FINAL  Final   Organism ID, Bacteria PSEUDOMONAS AERUGINOSA (A)  Final      Susceptibility   Pseudomonas aeruginosa - MIC*    CEFTAZIDIME 4 SENSITIVE Sensitive     CIPROFLOXACIN <=0.25 SENSITIVE Sensitive     GENTAMICIN <=1 SENSITIVE Sensitive     IMIPENEM 2 SENSITIVE Sensitive     PIP/TAZO <=4 SENSITIVE Sensitive     CEFEPIME 2 SENSITIVE Sensitive     * 30,000 COLONIES/mL PSEUDOMONAS AERUGINOSA  SARS Coronavirus 2 by RT PCR (hospital order, performed in [facility] hospital lab) Nasopharyngeal Nasopharyngeal Swab     Status: None   Collection Time: 11/27/19 10:57 PM   Specimen: Nasopharyngeal Swab  Result Value Ref Range Status   SARS Coronavirus 2 NEGATIVE NEGATIVE Final    Comment: (NOTE) SARS-CoV-2 target nucleic acids are NOT DETECTED.  The SARS-CoV-2 RNA is generally detectable in upper and lower respiratory specimens during the acute phase of infection. The lowest concentration of SARS-CoV-2 viral copies this assay can detect is 250 copies / mL. A negative result does not preclude SARS-CoV-2 infection and should not be used as the sole basis for treatment or other patient management decisions.  A negative result may occur with improper specimen collection / handling, submission of specimen other than  nasopharyngeal swab, presence of viral mutation(s) within the areas targeted by this assay, and inadequate number of viral copies (<250 copies / mL). A negative result must be combined with clinical observations, patient history, and epidemiological information.  Fact Sheet for Patients:   BoilerBrush.com.cy  Fact Sheet for Healthcare Providers: https://pope.com/  This test is not yet approved or  cleared by the [place] FDA and has been authorized for detection and/or diagnosis of SARS-CoV-2 by FDA  under an Emergency Use Authorization (EUA).  This EUA will remain in effect (meaning this test can be used) for the duration of the COVID-19 declaration under Section 564(b)(1) of the Act, 21 U.S.C. section 360bbb-3(b)(1), unless the authorization is terminated or revoked sooner.  Performed at [facility], [address] [place], [place] [address]     RADIOLOGY:  No results found.   CODE STATUS:     Code Status Orders  (From admission, onward)         Start     Ordered   11/28/19 0133  Do not attempt resuscitation (DNR)  Continuous       Question Answer Comment  In the event of cardiac or respiratory ARREST Do not call a "code blue"   In the event of cardiac or respiratory ARREST Do not perform Intubation, CPR, defibrillation or ACLS   In the event of cardiac or respiratory ARREST Use medication by any route, position, wound Barrett, and other measures to relive pain and suffering. May use oxygen, suction and manual treatment of airway obstruction as needed for comfort.   Comments MOST form in place, indicates comfort measures, determination of need for antibiotics, IV fluids for a defined trial period, no feeding tube      11/28/19 0132        Code Status History    Date Active Date Inactive Code Status Order ID Comments User Context   09/24/2019 1515 09/27/2019 1851 DNR [id number]  Delfino Lovett, MD Inpatient    09/22/2019 0336 09/24/2019 1515 DNR [id number]  Andris Baumann, MD ED   09/22/2019 0025 09/22/2019 0336 DNR [id number]  Loleta Rose, MD ED   07/19/2017 2243 07/21/2017 1829 Full Code [id number]  Oralia Manis, MD Inpatient   Advance Barrett Planning Activity       TOTAL TIME TAKING Barrett OF THIS PATIENT: 40 minutes.    Jerry Barrett M.D  [facility]    CC: Primary Barrett physician; Jerrilyn Cairo Primary Barrett

## 2019-11-30 NOTE — Progress Notes (Signed)
Jerry Barrett  A and O x 4 VSS. Pt tolerating diet well. No complaints of pain or nausea. IV removed intact, prescriptions given. Pt voices understanding of discharge instructions with no further questions. Pt discharged via wheelchair with axillary.   Allergies as of 11/30/2019      Reactions   Cephalexin Swelling, Rash      Medication List    TAKE these medications   glipiZIDE 2.5 MG 24 hr tablet Commonly known as: GLUCOTROL XL Take 2.5 mg by mouth daily with breakfast.   levofloxacin 750 MG tablet Commonly known as: LEVAQUIN Take 1 tablet (750 mg total) by mouth every other day for 3 doses.   levothyroxine 50 MCG tablet Commonly known as: SYNTHROID Take 50 mcg by mouth daily before breakfast.   lisinopril 2.5 MG tablet Commonly known as: ZESTRIL Take 2.5 mg by mouth daily.       Vitals:   11/29/19 1134 11/29/19 1954  BP: (!) 147/80 (!) 167/86  Pulse: 82 87  Resp: 18 16  Temp: 97.7 F (36.5 C) 98.2 F (36.8 C)  SpO2: 96% 98%    Jerry Barrett

## 2019-11-30 NOTE — ED Provider Notes (Signed)
[facility] Emergency Department Provider Note   ____________________________________________   First MD Initiated Contact with Patient 11/30/19 1555     (approximate)  I have reviewed the triage vital signs and the nursing notes.   HISTORY Via wife secondary to patient's dementia. Chief Complaint Fall    HPI THEON SOBOTKA is a 86 y.o. male patient arrived EMS secondary to a fall.  Patient fell trying to get to his home after leaving emergency room earlier today.  Patient struck the back of his head but denies LOC.  Wife states patient's AMS is at baseline.  Patient denies pain.           Past Medical History:  Diagnosis Date  . Dementia (HCC)   . Diabetes mellitus without complication (HCC)   . Hyperglycemia   . Hypertension   . Stroke (HCC)   . Thyroid disease     Patient Active Problem List   Diagnosis Date Noted  . Sepsis (HCC) 11/28/2019  . Pneumonia of right lung due to infectious organism   . Acute kidney injury superimposed on CKD (HCC)   . Sepsis due to gram-negative UTI (HCC) 11/27/2019  . Volume depletion   . Goals of care, counseling/discussion   . Palliative care by specialist   . Acute renal failure (HCC) 09/22/2019  . History of CVA with residual deficit 09/22/2019  . Hemiparesis and other late effects of cerebrovascular accident (HCC) 09/22/2019  . Hyperkalemia 09/22/2019  . SIRS (systemic inflammatory response syndrome) (HCC) 07/19/2017  . Influenza A 07/19/2017  . Diabetes (HCC) 07/19/2017  . HTN (hypertension) 07/19/2017  . Hypothyroidism 07/19/2017  . HLD (hyperlipidemia) 07/19/2017    Past Surgical History:  Procedure Laterality Date  . EYE SURGERY      Prior to Admission medications   Medication Sig Start Date End Date Taking? Authorizing Provider  glipiZIDE (GLUCOTROL XL) 2.5 MG 24 hr tablet Take 2.5 mg by mouth daily with breakfast.  10/21/19 10/20/20  [provider]  levofloxacin (LEVAQUIN) 750  MG tablet Take 1 tablet (750 mg total) by mouth every other day for 3 doses. 11/30/19 12/05/19  Enedina Finner, MD  levothyroxine (SYNTHROID, LEVOTHROID) 50 MCG tablet Take 50 mcg by mouth daily before breakfast.    [provider]  lisinopril (ZESTRIL) 2.5 MG tablet Take 2.5 mg by mouth daily. 11/24/19   [provider]    Allergies Cephalexin  No family history on file.  Social History Social History   Tobacco Use  . Smoking status: Never Smoker  . Smokeless tobacco: Never Used  Vaping Use  . Vaping Use: Never used  Substance Use Topics  . Alcohol use: No  . Drug use: Never    Review of Systems Constitutional: No fever/chills Eyes: No visual changes. ENT: No sore throat. Cardiovascular: Denies chest pain. Respiratory: Denies shortness of breath. Gastrointestinal: No abdominal pain.  No nausea, no vomiting.  No diarrhea.  No constipation. Genitourinary: Negative for dysuria. Musculoskeletal: Negative for back pain. Skin: Negative for rash.  Small scalp hematoma Neurological: Negative for headaches, focal weakness or numbness. Psychiatric:  Dementia Endocrine:  Diabetes, hyperlipidemia, hypertension, and hypothyroidism. Allergic/Immunilogical: Cephalexin ____________________________________________   PHYSICAL EXAM:  VITAL SIGNS: ED Triage Vitals  Enc Vitals Group     BP 11/30/19 1246 113/61     Pulse Rate 11/30/19 1246 85     Resp 11/30/19 1246 16     Temp 11/30/19 1246 98.6 F (37 C)     Temp Source  11/30/19 1246 Oral     SpO2 11/30/19 1246 100 %     Weight 11/30/19 1231 164 lb 15.9 oz (74.8 kg)     Height 11/30/19 1231 6' (1.829 m)     Head Circumference --      [place] Flow --      Pain Score 11/30/19 1231 0     Pain Loc --      Pain Edu? --      Excl. in GC? --     Constitutional: Well appearing and in no acute distress. Eyes: Conjunctivae are normal. PERRL. EOMI. Head: Atraumatic. Nose: No congestion/rhinnorhea. Mouth/Throat: Mucous  membranes are moist.  Oropharynx non-erythematous. Neck: No stridor.  No cervical spine tenderness to palpation. Hematological/Lymphatic/Immunilogical: No cervical lymphadenopathy. Cardiovascular: Normal rate, regular rhythm. Grossly normal heart sounds.  Good peripheral circulation. Respiratory: Normal respiratory effort.  No retractions. Lungs CTAB. Gastrointestinal: Soft and nontender. No distention. No abdominal bruits. No CVA tenderness. Musculoskeletal: No lower extremity tenderness nor edema.  No joint effusions. Neurologic:  Normal speech and language. No gross focal neurologic deficits are appreciated. No gait instability. Skin:  Skin is warm, dry and intact. No rash noted.  Small posterior scalp hematoma Psychiatric: AMS ____________________________________________   LABS (all labs ordered are listed, but only abnormal results are displayed)  Labs Reviewed - No data to display ____________________________________________  EKG   ____________________________________________  RADIOLOGY  ED MD interpretation:    Official radiology report(s): CT Head Wo Contrast  Result Date: 11/30/2019 CLINICAL DATA:  Head trauma, minor. Additional history provided: Fall, hitting head earlier today. EXAM: CT HEAD WITHOUT CONTRAST CT CERVICAL SPINE WITHOUT CONTRAST TECHNIQUE: Multidetector CT imaging of the head and cervical spine was performed following the standard protocol without intravenous contrast. Multiplanar CT image reconstructions of the cervical spine were also generated. COMPARISON:  CT head 11/27/2019 FINDINGS: CT HEAD FINDINGS Brain: There is stable mild generalized parenchymal atrophy and chronic small vessel ischemic disease. There is no acute intracranial hemorrhage. No demarcated cortical infarct. No extra-axial fluid collection. No evidence of intracranial mass. No midline shift. Vascular: No hyperdense vessel.  Atherosclerotic calcifications. Skull: Normal. Negative for  fracture or focal lesion. Sinuses/Orbits: Visualized orbits show no acute finding. Right frontal sinus osteoma. Mild ethmoid sinus mucosal thickening at the imaged levels. No significant mastoid effusion. Other: A small midline parietooccipital scalp hematoma is questioned. CT CERVICAL SPINE FINDINGS Alignment: Trace C6-C7 grade 1 anterolisthesis. Skull base and vertebrae: The basion-dental and atlanto-dental intervals are maintained.No evidence of acute fracture to the cervical spine. Soft tissues and spinal canal: No prevertebral fluid or swelling. No visible canal hematoma. Disc levels: Cervical spondylosis with multilevel disc space narrowing, uncovertebral and facet hypertrophy. Shallow posterior disc osteophyte complexes at C3-C4 and C6-C7. There is degenerative fusion of the posterior elements at multiple levels. Additionally, there is degenerative fusion across the disc space at C3-C4. Upper chest: No airspace consolidation or visible pneumothorax within the imaged lung apices. Other: Nonspecific subcutaneous cystic lesion within the posterior upper neck soft tissues measuring 2.9 x 1.9 cm (series 3, image 37). IMPRESSION: CT head: 1. No evidence of acute intracranial abnormality. A small midline parietooccipital scalp hematoma is questioned. 2. Stable mild generalized parenchymal atrophy and chronic small vessel ischemic disease. 3. Minimal ethmoid sinus mucosal thickening. CT cervical spine: 1. No evidence of acute fracture to the cervical spine. 2. Cervical spondylosis as described. There is degenerative fusion of the posterior elements at multiple levels. Additionally, there is degenerative fusion across the disc  space at C3-C4. 3. Nonspecific 2.9 cm subcutaneous cystic lesion within the posterior upper neck soft tissues. Electronically Signed   By: Kellie Simmering DO   On: 11/30/2019 15:19   CT Cervical Spine Wo Contrast  Result Date: 11/30/2019 CLINICAL DATA:  Head trauma, minor. Additional history  provided: Fall, hitting head earlier today. EXAM: CT HEAD WITHOUT CONTRAST CT CERVICAL SPINE WITHOUT CONTRAST TECHNIQUE: Multidetector CT imaging of the head and cervical spine was performed following the standard protocol without intravenous contrast. Multiplanar CT image reconstructions of the cervical spine were also generated. COMPARISON:  CT head 11/27/2019 FINDINGS: CT HEAD FINDINGS Brain: There is stable mild generalized parenchymal atrophy and chronic small vessel ischemic disease. There is no acute intracranial hemorrhage. No demarcated cortical infarct. No extra-axial fluid collection. No evidence of intracranial mass. No midline shift. Vascular: No hyperdense vessel.  Atherosclerotic calcifications. Skull: Normal. Negative for fracture or focal lesion. Sinuses/Orbits: Visualized orbits show no acute finding. Right frontal sinus osteoma. Mild ethmoid sinus mucosal thickening at the imaged levels. No significant mastoid effusion. Other: A small midline parietooccipital scalp hematoma is questioned. CT CERVICAL SPINE FINDINGS Alignment: Trace C6-C7 grade 1 anterolisthesis. Skull base and vertebrae: The basion-dental and atlanto-dental intervals are maintained.No evidence of acute fracture to the cervical spine. Soft tissues and spinal canal: No prevertebral fluid or swelling. No visible canal hematoma. Disc levels: Cervical spondylosis with multilevel disc space narrowing, uncovertebral and facet hypertrophy. Shallow posterior disc osteophyte complexes at C3-C4 and C6-C7. There is degenerative fusion of the posterior elements at multiple levels. Additionally, there is degenerative fusion across the disc space at C3-C4. Upper chest: No airspace consolidation or visible pneumothorax within the imaged lung apices. Other: Nonspecific subcutaneous cystic lesion within the posterior upper neck soft tissues measuring 2.9 x 1.9 cm (series 3, image 37). IMPRESSION: CT head: 1. No evidence of acute intracranial  abnormality. A small midline parietooccipital scalp hematoma is questioned. 2. Stable mild generalized parenchymal atrophy and chronic small vessel ischemic disease. 3. Minimal ethmoid sinus mucosal thickening. CT cervical spine: 1. No evidence of acute fracture to the cervical spine. 2. Cervical spondylosis as described. There is degenerative fusion of the posterior elements at multiple levels. Additionally, there is degenerative fusion across the disc space at C3-C4. 3. Nonspecific 2.9 cm subcutaneous cystic lesion within the posterior upper neck soft tissues. Electronically Signed   By: Kellie Simmering DO   On: 11/30/2019 15:19    ____________________________________________   PROCEDURES  Procedure(s) performed (including Critical Care):  Procedures   ____________________________________________   INITIAL IMPRESSION / ASSESSMENT AND PLAN / ED COURSE  As part of my medical decision making, I reviewed the following data within the [facility]      Patient presents with a scalp hematoma secondary to a fall.  Patient's wife denies LOC.  Discussed head and cervical spine CT findings with wife showing no acute abnormalities.  Patient's wife given discharge care instruction were follow-up PCP.   ALOYSIUS HEINLE was evaluated in Emergency Department on 11/30/2019 for the symptoms described in the history of present illness. He was evaluated in the context of the global COVID-19 pandemic, which necessitated consideration that the patient might be at risk for infection with the SARS-CoV-2 virus that causes COVID-19. Institutional protocols and algorithms that pertain to the evaluation of patients at risk for COVID-19 are in a state of rapid change based on information released by regulatory bodies including the CDC and federal and state organizations. These policies and  algorithms were followed during the patient's care in the ED.        ____________________________________________   FINAL CLINICAL IMPRESSION(S) / ED DIAGNOSES  Final diagnoses:  Minor head injury, initial encounter     ED Discharge Orders    None       Note:  This document was prepared using Dragon voice recognition software and may include unintentional dictation errors.    Joni Reining, PA-C 11/30/19 1614    Miguel Aschoff., MD 12/02/19 2004

## 2019-12-01 ENCOUNTER — Telehealth: Payer: Self-pay | Admitting: Primary Care

## 2019-12-01 NOTE — Telephone Encounter (Signed)
Spoke with wife, Rudell Cobb, and asked her if she wanted to reschedule the Initial Consult since patient was just discharged from hospital yesterday.  Wife requested that I call her niece, Katheran Awe, to schedule visit, wife said if that's what Junious Dresser wants to do then she was okay with this.  Called and spoke with Junious Dresser, and we have rescheduled the Initial Consult for 12/08/19 @ 9 AM.

## 2019-12-02 LAB — CULTURE, BLOOD (ROUTINE X 2)
Culture: NO GROWTH
Culture: NO GROWTH
Special Requests: ADEQUATE
Special Requests: ADEQUATE

## 2019-12-07 DIAGNOSIS — H9193 Unspecified hearing loss, bilateral: Secondary | ICD-10-CM | POA: Insufficient documentation

## 2019-12-08 ENCOUNTER — Other Ambulatory Visit: Payer: Self-pay

## 2019-12-08 ENCOUNTER — Other Ambulatory Visit: Payer: Medicare Other | Admitting: Primary Care

## 2019-12-08 DIAGNOSIS — I69359 Hemiplegia and hemiparesis following cerebral infarction affecting unspecified side: Secondary | ICD-10-CM

## 2019-12-08 DIAGNOSIS — I69398 Other sequelae of cerebral infarction: Secondary | ICD-10-CM

## 2019-12-08 DIAGNOSIS — Z515 Encounter for palliative care: Secondary | ICD-10-CM

## 2019-12-08 NOTE — Progress Notes (Signed)
[facility] Consult Note Telephone: [phone]  Fax: [phone]  PATIENT NAME: Jerry Barrett [address] [place] [address] [phone] (home)  DOB: July 04, 1932 MRN: [id number]  PRIMARY CARE PROVIDER:   Dr Amador Cunas, [facility],  [place] [address] [phone]  REFERRING PROVIDER:  Dr Amador Cunas, [facility] [place] [facility],  [place] [address] [phone]  RESPONSIBLE PARTY:   Extended Emergency Contact Information Primary Emergency Contact: Ariyan, Brisendine Address: [place], [place] [address] [place] Phone: [phone] Relation: Spouse Secondary Emergency Contact: [name] Mobile Phone: [phone] Relation: Niece  I met with patient and family in home.  ASSESSMENT AND RECOMMENDATIONS:   1. Advance Care Planning/Goals of Care: Goals include to maximize quality of life and symptom management. Our advance care planning conversation included a discussion about:     The value and importance of advance care planning   Exploration of goals of care in the event of a sudden injury or illness   Identification of a healthcare agent  Wife stated they wanted to "give up on him" at the hospital and they refused. He was given some fluids and became more alert. Review of an  advance directive document. They did not want to discuss MOST currently and will look it over. I left a copy for their review. There is a [place] DNR in the home.  2. Symptom Management:   Diabetes: Is now on glipizide 2.5 mg . BG is regulated now well. A1C hg - 6.9, FBS this am was 120, has gotten to 80 mg/dl. Recommend having glucogon gel for hypoglycemia with new glipizide  Rx.   Constipation: Recommended miralax daily for moderate constipation. Wife states he goes every several days.   Caregiver strain:   Has hired care givers for 24/7 help at the  home. Pt resides with wife and niece is available from [place] on short notice. Pt is getting stronger with PT and beginning to ambulate more (I).  3. Family /Caregiver/Community Supports: Lives with wife of 84 years (tomorrow) in home they built when they married. No children. He has a niece on his wife's side who is helping them navigate.   4. Cognitive / Functional decline: Alert and oriented x 2. Family was not sure if he has some dementia. Dozes during much of interview. Improving function, can feed self, do some adls. Needs help with all ialds.    5. Follow up Palliative Care Visit: Palliative care will continue to follow for goals of care clarification and symptom management. Return 6 weeks or prn.  I spent 60  minutes providing this consultation,  from 0930 to 1030. More than 50% of the time in this consultation was spent coordinating communication.   HISTORY OF PRESENT ILLNESS:  Jerry Barrett is a 86 y.o. year old male with multiple medical problems including debility, weight loss, constipation, Diabetes. Palliative Care was asked to follow this patient by consultation request of Sharyne Peach, MD  to help address advance care planning and goals of care. This is the initial visit.  CODE STATUS: DNR  PPS: 50%  HOSPICE ELIGIBILITY/DIAGNOSIS: no  PAST MEDICAL HISTORY:  Past Medical History:  Diagnosis Date  . Dementia ([place])   . Diabetes mellitus without complication ([place])   . Hyperglycemia   . Hypertension   . Stroke ([place])   .  Thyroid disease     SOCIAL HX:  Social History   Tobacco Use  . Smoking status: Never Smoker  . Smokeless tobacco: Never Used  Substance Use Topics  . Alcohol use: No    ALLERGIES:  Allergies  Allergen Reactions  . Cephalexin Swelling and Rash     PERTINENT MEDICATIONS:  Outpatient Encounter Medications as of 12/08/2019  Medication Sig  . glipiZIDE (GLUCOTROL XL) 2.5 MG 24 hr tablet Take 2.5 mg by mouth daily with breakfast.   .  levothyroxine (SYNTHROID, LEVOTHROID) 50 MCG tablet Take 50 mcg by mouth daily before breakfast.  . lisinopril (ZESTRIL) 2.5 MG tablet Take 2.5 mg by mouth daily.   No facility-administered encounter medications on file as of 12/08/2019.    PHYSICAL EXAM / ROS:   Current and past weights: 167 lbs, 180 lbs 6 months ago, current BMI 22.  General: NAD, frail appearing, WNWD Cardiovascular: no chest pain reported, no edema Pulmonary: no cough, no increased SOB, room air Abdomen: appetite good, endorses constipation, incontinent of bowel,  GU: denies dysuria, incontinent of urine at times, esp hs MSK:  +  joint and ROM abnormalities, ambulatory with walker Skin: no rashes or wounds reported Neurological: L Weakness, recent CVA, Speech has slowed, sleepiness, some memory   Eliezer Lofts, NP [facility]  COVID-19 PATIENT SCREENING TOOL  Person answering questions: ____________[name]_______ _____   1.  Is the patient or any family member in the home showing any signs or symptoms regarding respiratory infection?               Person with Symptom- __________NA_________________  a. Fever                                                                          Yes___ No___          ___________________  b. Shortness of breath                                                    Yes___ No___          ___________________ c. Cough/congestion                                       Yes___  No___         ___________________ d. Body aches/pains                                                         Yes___ No___        ____________________ e. Gastrointestinal symptoms (diarrhea, nausea)           Yes___ No___        ____________________  2. Within the past 14 days, has anyone living in the home had any contact with someone with or under  investigation for COVID-19?    Yes___ No_X_   Person __________________

## 2020-01-26 ENCOUNTER — Other Ambulatory Visit: Payer: Medicare Other | Admitting: Primary Care

## 2020-01-26 ENCOUNTER — Other Ambulatory Visit: Payer: Self-pay

## 2020-01-26 DIAGNOSIS — Z515 Encounter for palliative care: Secondary | ICD-10-CM

## 2020-01-26 DIAGNOSIS — I69359 Hemiplegia and hemiparesis following cerebral infarction affecting unspecified side: Secondary | ICD-10-CM

## 2020-01-26 NOTE — Progress Notes (Signed)
[facility] Consult Note Telephone: [phone]  Fax: [phone]  PATIENT NAME: Jerry Barrett [id number] [phone] (home)  DOB: Mar 25, 1933 MRN: [id number]  PRIMARY CARE PROVIDER:    Sharyne Peach, MD,  [place] [address] [phone]  REFERRING PROVIDER:   [facility] [address] [place],  [place] [address] [phone]  RESPONSIBLE PARTY:   Extended Emergency Contact Information Primary Emergency Contact: Jerry, Barrett Address: [place], [place] [address] [place] Phone: [phone] Relation: Spouse Secondary Emergency Contact: Jerry Barrett Mobile Phone: [phone] Relation: Niece  I met face to face with patient and family in home/facility.  ASSESSMENT AND RECOMMENDATIONS:   1. Advance Care Planning/Goals of Care: Goals include to maximize quality of life and symptom management. Our advance care planning conversation included a discussion about:     The value and importance of advance care planning   Exploration of personal, cultural or spiritual beliefs that might influence medical decisions   Exploration of goals of care in the event of a sudden injury or illness   Identification  of a healthcare agent - niece  Review  of an  advance directive document .  Has a DNR. Discussed ACP and suggested review of ACP document and review. MOST not reviewed and Jerry Barrett wanted to wait to discuss.   2. Symptom Management:   Nutrition: Not eating too well, not cooking per wife. Wife endorse weight loss. And even impacted the fit of hearing aids. Recommend ensure supplements with low card and higher protein. Uses carnation instant breakfast. Wife is knowledgeable about food additives and states they do often go out to meals. Offered MOW but she refuses.   Hypoglycemic episodes: Denies hypoglycemic episodes. Is on XL  sufanomide. Encouraged to get glucogon but states it does not taste good. A1C = 6.9 %. Education provided to keep A1C closer to 7-8 in order to avoid hypoglycemia. Type 2 diabetes.   Mobility: Good for now in the home. Wife   Investment banker, operational.   Sensory: Hearing aids were adjusted with good response.   3. Follow up Palliative Care Visit: Palliative care will continue to follow for goals of care clarification and symptom management. Return 6-8 weeks or prn.  4. Family /Caregiver/Community Supports: Lives with wife. Niece is in [place] and helps. Has used senior center in past.  5. Cognitive / Functional decline: A and O x 1. Needs cueing for some adls. Needs assistance for ialds.   I spent 60 minutes providing this consultation,  from 1215 to 1315. More than 50% of the time in this consultation was spent coordinating communication.   CHIEF COMPLAINT: DM, mobility limitations  HISTORY OF PRESENT ILLNESS:  Jerry Barrett is a 86 y.o. year old male with multiple medical problems including CVA, hemiparesis, DM. Palliative Care was asked to follow this patient by consultation request of Jerry Barrett, [facility] to help address advance care planning and goals of care. This is a follow up visit.  CODE STATUS: DNR  PPS: 50%  HOSPICE ELIGIBILITY/DIAGNOSIS: TBD  PAST MEDICAL HISTORY:  Past Medical History:  Diagnosis Date  . Dementia ([place])   . Diabetes mellitus without complication ([place])   . Hyperglycemia   . Hypertension   . Stroke ([place])   . Thyroid disease     SOCIAL HX:  Social History  Tobacco Use  . Smoking status: Never Smoker  . Smokeless tobacco: Never Used  Substance Use Topics  . Alcohol use: No   FAMILY HX: No family history on file.  ALLERGIES:  Allergies  Allergen Reactions  . Cephalexin Swelling and Rash     PERTINENT MEDICATIONS:  Outpatient Encounter Medications as of 84/19/2021  Medication Sig  . glipiZIDE (GLUCOTROL XL) 2.5 MG 24 hr tablet Take 2.5 mg by mouth  daily with breakfast.   . levothyroxine (SYNTHROID, LEVOTHROID) 50 MCG tablet Take 50 mcg by mouth daily before breakfast.  . lisinopril (ZESTRIL) 2.5 MG tablet Take 2.5 mg by mouth daily.   No facility-administered encounter medications on file as of 84/19/2021.    PHYSICAL EXAM / ROS:   Current and past weights: lost wt, reported 168 lbs, chart reports 182 in 4/21. 12 % loss. General: NAD, frail appearing, thin. States they have had covid immunization. Cardiovascular: no chest pain reported, slight pedal  edema  Pulmonary: no cough, no increased SOB, room air Abdomen: appetite good, denies constipation, continent of bowel GU: denies dysuria, incontinent of urine at times MSK:  no joint and ROM abnormalities, ambulatory in home, denies falls Skin: no rashes or wounds reported Neurological: Weakness, HOH, cognitive impairment, denies pain. Sleeps a lot day and night. Does not interact much verbally  but alert.   Jerry Coop, NP , DNP, MPH, [facility]  COVID-19 PATIENT SCREENING TOOL  Person answering questions: ___________wife_____ _____   1.  Is the patient or any family member in the home showing any signs or symptoms regarding respiratory infection?               Person with Symptom- __________NA_________________  a. Fever                                                                          Yes___ No___          ___________________  b. Shortness of breath                                                    Yes___ No___          ___________________ c. Cough/congestion                                       Yes___  No___         ___________________ d. Body aches/pains                                                         Yes___ No___        ____________________ e. Gastrointestinal symptoms (diarrhea, nausea)           Yes___ No___        ____________________  2. Within the past 14  days, has anyone living in the home had any contact with someone with or under investigation  for COVID-19?    Yes___ No_X_   Person __________________

## 2020-03-29 ENCOUNTER — Other Ambulatory Visit: Payer: Self-pay

## 2020-03-29 ENCOUNTER — Other Ambulatory Visit: Payer: Medicare Other | Admitting: Primary Care

## 2020-04-17 ENCOUNTER — Other Ambulatory Visit: Payer: Self-pay

## 2020-04-17 ENCOUNTER — Telehealth: Payer: Self-pay | Admitting: Primary Care

## 2020-04-17 ENCOUNTER — Other Ambulatory Visit: Payer: Medicare Other | Admitting: Primary Care

## 2020-04-17 NOTE — Telephone Encounter (Signed)
T/c to schedule TM or reschedule. No answer, message left.

## 2020-05-08 ENCOUNTER — Other Ambulatory Visit: Payer: Medicare Other | Admitting: Primary Care

## 2020-05-08 ENCOUNTER — Other Ambulatory Visit: Payer: Self-pay

## 2020-05-08 ENCOUNTER — Encounter: Payer: Self-pay | Admitting: Primary Care

## 2020-05-08 DIAGNOSIS — Z515 Encounter for palliative care: Secondary | ICD-10-CM

## 2020-05-08 DIAGNOSIS — I69359 Hemiplegia and hemiparesis following cerebral infarction affecting unspecified side: Secondary | ICD-10-CM

## 2020-05-08 NOTE — Progress Notes (Signed)
[facility] Pall[facility] Care Consult Note Telephone: [phone]  Fax: [phone]     Date of encounter: 05/08/20 PATIENT NAME: Jerry Barrett [place] [id number] (home)  DOB: July 27, 1932 MRN: [id number]  PRIMARY CARE PROVIDER:    Sharyne Peach, MD,  [place] [address] [phone]  REFERRING PROVIDER:   Sharyne Peach, [place],  [place] [address] [phone]  RESPONSIBLE PARTY:   Extended Emergency Contact Information Primary Emergency Contact: Cobe, Viney Address: [place], [place] [address] [place] Phone: [phone] Relation: Spouse Secondary Emergency Contact: [name] Mobile Phone: [phone] Relation: Niece  I met face to face with patient and family in  home. Palliative Care was asked to follow this patient by consultation request of Sharyne Peach, MD to help address advance care planning and goals of care. This is a follow up  visit.   ASSESSMENT AND RECOMMENDATIONS:   1. Advance Care Planning/Goals of Care: Goals include to maximize quality of life and symptom management. Our advance care planning conversation included a discussion about:     The value and importance of advance care planning   Experiences with loved ones who have been seriously ill or have died   Exploration of personal, cultural or spiritual beliefs that might influence medical decisions   Exploration of goals of care in the event of a sudden injury or illness   Identification  of a healthcare agent - Niece  Review of an  advance directive document.  Review of need to update POA since the passing of their last choice.   We discussed the MOST form and they would like to think about the choices before next visit. He has a DNR and wife states it is put away. I encouraged her to put it in a very accessible place.  We discussed  planning ahead for needing more caregiving as they aged. Currently they have someone coming in to sit with pt several days a week so Mrs. Puccio can run errands.  2. Symptom Management:   Med compliant, no s/sx TIA or stroke. No falls reported. Encouraged to go out safely and ambulate, have some  Stimulation. Pt often sits in the dark den and dozes during day. Wife states concerns about omicron strain. They have their covid shots and boosters. I encouraged them to get shingrix as well, neither has, and pt has had shingles in the past.  3. Follow up Palliative Care Visit: Palliative care will continue to follow for goals of care clarification and symptom management. Return 8 weeks or prn.  4. Family /Caregiver/Community Supports: [place]. No children, niece is POA but in [place]. Wife still drives.  5. Cognitive / Functional decline:  A and O x 2, cannot answer how long they were married. Able to ambulate, More interactive upon my departure, wife states he is very HOH and cannot hear much conversations.  I spent 50 minutes providing this consultation,  from 1130 to 1150. More than 50% of the time in this consultation was spent coordinating communication.   CODE STATUS: DNR  PPS: 50%  HOSPICE ELIGIBILITY/DIAGNOSIS: TBD  Subjective:  CHIEF COMPLAINT: debility  HISTORY OF PRESENT ILLNESS:  Jerry Barrett is a 86 y.o. year old male  with h/o TIA and some hemiparesis, now apparently resolved.  Continues to have  dementia decline, less interactive verbally. Able to ambulate (I) in home.  We are asked to consult around ACP and complex medical decision making.    History obtained from review of EMR, discussion with primary team, and  interview with family, caregiver  and/or Jerry Barrett. Records reviewed and summarized above.   CURRENT PROBLEM LIST:  Patient Active Problem List   Diagnosis Date Noted   Sepsis ([place]) 11/28/2019   Pneumonia of right lung due to infectious  organism    Acute kidney injury superimposed on CKD ([place])    Sepsis due to gram-negative UTI ([place]) 11/27/2019   Volume depletion    Goals of care, counseling/discussion    Palliative care by specialist    Acute renal failure ([place]) 09/22/2019   History of CVA with residual deficit 09/22/2019   Hemiparesis and other late effects of cerebrovascular accident ([place]) 09/22/2019   Hyperkalemia 09/22/2019   SIRS (systemic inflammatory response syndrome) ([place]) 07/19/2017   Influenza A 07/19/2017   Diabetes ([place]) 07/19/2017   HTN (hypertension) 07/19/2017   Hypothyroidism 07/19/2017   HLD (hyperlipidemia) 07/19/2017   PAST MEDICAL HISTORY:  Active Ambulatory Problems    Diagnosis Date Noted   SIRS (systemic inflammatory response syndrome) ([place]) 07/19/2017   Influenza A 07/19/2017   Diabetes ([place]) 07/19/2017   HTN (hypertension) 07/19/2017   Hypothyroidism 07/19/2017   HLD (hyperlipidemia) 07/19/2017   Acute renal failure ([place]) 09/22/2019   History of CVA with residual deficit 09/22/2019   Hemiparesis and other late effects of cerebrovascular accident ([place]) 09/22/2019   Hyperkalemia 09/22/2019   Goals of care, counseling/discussion    Palliative care by specialist    Volume depletion    Sepsis due to gram-negative UTI ([place]) 11/27/2019   Sepsis ([place]) 11/28/2019   Pneumonia of right lung due to infectious organism    Acute kidney injury superimposed on CKD [facility])    Resolved Ambulatory Problems    Diagnosis Date Noted   Dementia without behavioral disturbance ([place]) 09/22/2019   Past Medical History:  Diagnosis Date   Dementia ([place])    Diabetes mellitus without complication ([place])    Hyperglycemia    Hypertension    Stroke ([place])    Thyroid disease    SOCIAL HX:  Social History   Tobacco Use   Smoking status: Never Smoker   Smokeless tobacco: Never Used  Substance Use Topics   Alcohol use: No   FAMILY HX: Mother History of  TIA  ALLERGIES:  Allergies  Allergen Reactions   Cephalexin Swelling and Rash     PERTINENT MEDICATIONS:  Outpatient Encounter Medications as of 05/08/2020  Medication Sig   aspirin 81 MG chewable tablet Chew by mouth daily.   glipiZIDE (GLUCOTROL XL) 2.5 MG 24 hr tablet Take 2.5 mg by mouth daily with breakfast.    levothyroxine (SYNTHROID, LEVOTHROID) 50 MCG tablet Take 50 mcg by mouth daily before breakfast.   lisinopril (ZESTRIL) 2.5 MG tablet Take 2.5 mg by mouth daily.   No facility-administered encounter medications on file as of 05/08/2020.   Objective: ROS  General: NAD EYES: denies vision changes, wears glasses ENMT: denies dysphagia, HOH Cardiovascular: denies chest pain Pulmonary: denies cough, denies increased SOB Abdomen: endorses good appetite,denies constipation, endorses continence of bowel, BG 125 today. GU: denies dysuria, endorses continence of urine MSK:  endorses ROM limitations, no falls reported, ambulatory (I) Skin: skin cancer on hand and arm  Neurological:  denies pain, denies insomnia Psych: Endorses flat mood Heme/lymph/immuno: denies bruises, abnormal bleeding  Physical  Exam: Current and past weights:181 lbs,  Constitutional:  NAD General :frail appearing, WNWD EYES: anicteric sclera,lids intact, no discharge  ENMT: intact hearing,oral mucous membranes moist CV: no LE edema Pulmonary:  no increased work of breathing, no cough, no audible wheezes, room air Abdomen: intake 100%, no ascites GU: deferred MSK: moderate sacropenia, decreased ROM in all extremities,  ambulatory Skin: warm and dry, lesions on hand to be excised this week Neuro: Generalized weakness, moderate cognitive impairment Psych: non -anxious affect, A and O x 2 Hem/lymph/immuno: no widespread bruising   Thank you for the opportunity to participate in the care of Jerry Barrett.  The palliative care team will continue to follow. Please call our office at [phone] if we  can be of additional assistance.  Jason Coop, NP , DNP, MPH, AGPCNP-BC, ACHPN  COVID-19 PATIENT SCREENING TOOL  Person answering questions: ____________self______ _____   1.  Is the patient or any family member in the home showing any signs or symptoms regarding respiratory infection?               Person with Symptom- __________NA_________________  a. Fever                                                                          Yes___ No___          ___________________  b. Shortness of breath                                                    Yes___ No___          ___________________ c. Cough/congestion                                       Yes___  No___         ___________________ d. Body aches/pains                                                         Yes___ No___        ____________________ e. Gastrointestinal symptoms (diarrhea, nausea)           Yes___ No___        ____________________  2. Within the past 14 days, has anyone living in the home had any contact with someone with or under investigation for COVID-19?    Yes___ No_X_   Person __________________

## 2020-06-18 ENCOUNTER — Other Ambulatory Visit: Payer: Medicare Other | Admitting: Primary Care

## 2020-06-19 ENCOUNTER — Other Ambulatory Visit: Payer: Medicare Other | Admitting: Primary Care

## 2020-07-05 DIAGNOSIS — L853 Xerosis cutis: Secondary | ICD-10-CM | POA: Insufficient documentation

## 2020-07-17 ENCOUNTER — Other Ambulatory Visit: Payer: Medicare Other | Admitting: Primary Care

## 2020-07-31 ENCOUNTER — Other Ambulatory Visit: Payer: Medicare Other | Admitting: Primary Care

## 2020-07-31 ENCOUNTER — Other Ambulatory Visit: Payer: Self-pay

## 2020-07-31 DIAGNOSIS — I6930 Unspecified sequelae of cerebral infarction: Secondary | ICD-10-CM

## 2020-07-31 DIAGNOSIS — I69319 Unspecified symptoms and signs involving cognitive functions following cerebral infarction: Secondary | ICD-10-CM

## 2020-07-31 DIAGNOSIS — Z515 Encounter for palliative care: Secondary | ICD-10-CM

## 2020-07-31 NOTE — Progress Notes (Signed)
[facility] Pall[facility] Consult Note Telephone: [phone]  Fax: [phone]    Date of encounter: 07/31/20 PATIENT NAME: Jerry Barrett Jerry Barrett [id number] [phone] (home)  DOB: June 21, 1932 MRN: [id number]  PRIMARY CARE PROVIDER:    Sharyne Peach, MD,  [place] [address] [phone]  REFERRING PROVIDER:   Sharyne Peach, [place],  [place] [address] [phone]  RESPONSIBLE PARTY:   Extended Emergency Contact Information Primary Emergency Contact: Barrett, Jerry Address: [place], [place] [address] [place] Barrett: [phone] Relation: Spouse Secondary Emergency Contact: Jerry Barrett: [phone] Relation: Niece  I met face to face with patient and family in  home. Palliative Care was asked to follow this patient by consultation request of Sharyne Peach, MD to help address advance care planning and goals of care. This is a follow up  visit.   ASSESSMENT AND RECOMMENDATIONS:   1. Advance Care Planning/Goals of Care: Goals include to maximize quality of life and symptom management. Our advance care planning conversation included a discussion about:     The value and importance of advance care planning   Experiences with loved ones who have been seriously ill or have died   Exploration of personal, cultural or spiritual beliefs that might influence medical decisions   Exploration of goals of care in the event of a sudden injury or illness   Wife declines to prepare MOST again today. We reviewed DNR on record.   2. Symptom Management:   LE edema and dermatitis/dermatosis: Pt has swollen LE bil due to dependence, using bacitracin but area looks as if it would respond to triamcinolone. Ordered 0.025% bid for dermatosis. Needs larger shoes with velcro closures. Is using too small /ill fitting shoes currently which  are a fall risk. Needs compression stockings bil, but sleeps in a chair at hs with legs dependent. Wife states he does not like to elevate the LE or go to bed at night.   Dermatosis is in need of Rx with triamcinolone, 0.025% bid, sent to local pharmacy.   Dementia;  PT not verbal today, wife states he was verbal yesterday, and needs to change his hearing aide battery. Very drowsy today. FAST score 6 E-7A.   Glucose control; Has caregiver who does spot checks. Wife does not check. Usually good unless he's eaten sweets. Discussed hypoglycemia interventions.  Falls: [place] several weeks ago, in the house. Wife called ems  Who assessed but did not transport. He has a history of falling outside, but never inside before.  Caregiving: Wife has had some helpers, and endorses fatigue. POA has not been able to help as much.   3. Follow up Palliative Care Visit: Palliative care will continue to follow for goals of care clarification and symptom management. Return 6-8 weeks or prn.  4. Family /Caregiver/Community Supports: Lives with wife in own home, [place]. No children, niece in [place] is POA.  5. Cognitive / Functional decline:  A and O x 1, drowsy, + cognitive decline, able to ambulate with help, cueing. Needs cueing to perform any adls. Dependent in most adls, all iadls.   I spent 60 minutes providing this consultation,  from 1300 to 1400. More than 50% of the time in this consultation was spent in counseling and care coordination.  CODE STATUS: DNR  PPS: 40%  HOSPICE ELIGIBILITY/DIAGNOSIS: TBD  Subjective:  CHIEF COMPLAINT: dementia  HISTORY OF PRESENT ILLNESS:  Jerry Barrett is a 87 y.o. year old male  with h/o cva, cognitive impairment, COPD, LE edema, mitral valve leak. He has declined in interaction and ability for adls, even with cuing. Past 2 interviews he has dozed off and not added much in the conversation. Wife is chief caregiver.   We are asked to consult around advance care  planning and complex medical decision making.    Review and summarization of old Epic records shows or history from other than patient.  Review or lab tests, radiology,  or medicine. Recent labs  Review of case with family member. Wife Jerry Barrett  History obtained from review of EMR, discussion with primary team, and  interview with family, caregiver  and/or Mr. Jerry Barrett. Records reviewed and summarized above.   CURRENT PROBLEM LIST:  Patient Active Problem List   Diagnosis Date Noted  . Sepsis ([place]) 11/28/2019  . Pneumonia of right lung due to infectious organism   . Acute kidney injury superimposed on CKD ([place])   . Sepsis due to gram-negative UTI ([place]) 11/27/2019  . Volume depletion   . Goals of care, counseling/discussion   . Palliative care by specialist   . Acute renal failure ([place]) 09/22/2019  . History of CVA with residual deficit 09/22/2019  . Hemiparesis and other late effects of cerebrovascular accident ([place]) 09/22/2019  . Hyperkalemia 09/22/2019  . SIRS (systemic inflammatory response syndrome) ([place]) 07/19/2017  . Influenza A 07/19/2017  . Diabetes ([place]) 07/19/2017  . HTN (hypertension) 07/19/2017  . Hypothyroidism 07/19/2017  . HLD (hyperlipidemia) 07/19/2017   PAST MEDICAL HISTORY:  Active Ambulatory Problems    Diagnosis Date Noted  . SIRS (systemic inflammatory response syndrome) ([place]) 07/19/2017  . Influenza A 07/19/2017  . Diabetes ([place]) 07/19/2017  . HTN (hypertension) 07/19/2017  . Hypothyroidism 07/19/2017  . HLD (hyperlipidemia) 07/19/2017  . Acute renal failure ([place]) 09/22/2019  . History of CVA with residual deficit 09/22/2019  . Hemiparesis and other late effects of cerebrovascular accident ([place]) 09/22/2019  . Hyperkalemia 09/22/2019  . Goals of care, counseling/discussion   . Palliative care by specialist   . Volume depletion   . Sepsis due to gram-negative UTI ([place]) 11/27/2019  . Sepsis ([place]) 11/28/2019  . Pneumonia of right lung due to infectious  organism   . Acute kidney injury superimposed on CKD [facility])    Resolved Ambulatory Problems    Diagnosis Date Noted  . Dementia without behavioral disturbance ([place]) 09/22/2019   Past Medical History:  Diagnosis Date  . Dementia ([place])   . Diabetes mellitus without complication ([place])   . Hyperglycemia   . Hypertension   . Stroke ([place])   . Thyroid disease    SOCIAL HX:  Social History   Tobacco Use  . Smoking status: Never Smoker  . Smokeless tobacco: Never Used  Substance Use Topics  . Alcohol use: No   FAMILY HX:  Family History  Problem Relation Age of Onset  . Transient ischemic attack Mother        ALLERGIES:  Allergies  Allergen Reactions  . Cephalexin Swelling and Rash     PERTINENT MEDICATIONS:  Outpatient Encounter Medications as of 07/31/2020  Medication Sig  . aspirin 81 MG chewable tablet Chew by mouth daily.  Mar[place] Kitchen glipiZIDE (GLUCOTROL XL) 2.5 MG 24 hr tablet Take 2.5 mg by mouth daily with breakfast.   . levothyroxine (SYNTHROID, LEVOTHROID) 50 MCG tablet Take 50  mcg by mouth daily before breakfast.  . lisinopril (ZESTRIL) 2.5 MG tablet Take 2.5 mg by mouth daily.   No facility-administered encounter medications on file as of 07/31/2020.    Objective: ROS  General: NAD EYES: denies vision changes, wears glasses ENMT: denies dysphagia Cardiovascular: denies chest pain Pulmonary: denies  cough, denies increased SOB Abdomen: endorses good appetite, denies constipation, endorses incontinence of bowel GU: denies dysuria, endorses incontinence of urine MSK:  endorses ROM limitations, 1 fall reported Skin: denies rashes or wounds Neurological: endorses increased weakness, denies   pain, denies insomnia Psych: Endorses flat mood Heme/lymph/immuno: denies bruises, abnormal bleeding   Physical Exam: Current and past weights: 185 lbs Constitutional: NAD General: frail appearing, WNWD  EYES: anicteric sclera, lids intact, no discharge  ENMT: poor  hearing,has hearing aides, oral mucous membranes moist, dentition intact CV: 2+ pitting  LE edema Pulmonary:  no increased work of breathing, no cough, no audible wheezes, room air Abdomen: intake 75%,no ascites GU: deferred MSK: mild sarcopenia, decreased ROM in all extremities, no contractures of LE,  Ambulatory with walker and help. Skin: warm and dry, dermatosis bil LE to mid tibia Neuro: Generalized weakness, severe cognitive impairment Psych: non-anxious affect, A and O x 1 Hem/lymph/immuno: no widespread bruising   Thank you for the opportunity to participate in the care of Mr. Nylen.  The palliative care team will continue to follow. Please call our office at [phone] if we can be of additional assistance.  Jason Coop, NP , DNP, MPH, AGPCNP-BC, ACHPN   COVID-19 PATIENT SCREENING TOOL  Person answering questions: _______wife____________   1.  Is the patient or any family member in the home showing any signs or symptoms regarding respiratory infection?                  Person with Symptom  ______________na___________ a. Fever/chills/headache                                                        Yes___ No__X_            b. Shortness of breath                                                            Yes___ No__X_           c. Cough/congestion                                               Yes___  No__X_          d. Muscle/Body aches/pains                                                   Yes___ No__X_         e. Gastrointestinal symptoms (diarrhea,nausea)  Yes___ No__X_         f. Sudden loss of smell or taste      Yes___ No__X_        2. Within the past 10 days, has anyone living in the home had any contact with someone with or under investigation for COVID-19?    Yes___ No__X__   Person __________________

## 2020-08-08 ENCOUNTER — Emergency Department: Payer: Medicare Other

## 2020-08-08 ENCOUNTER — Encounter: Payer: Self-pay | Admitting: Emergency Medicine

## 2020-08-08 ENCOUNTER — Other Ambulatory Visit: Payer: Self-pay

## 2020-08-08 ENCOUNTER — Emergency Department
Admission: EM | Admit: 2020-08-08 | Discharge: 2020-08-08 | Discharge status: Against Medical Advice | Payer: Medicare Other | Attending: Emergency Medicine | Admitting: Emergency Medicine

## 2020-08-08 DIAGNOSIS — E039 Hypothyroidism, unspecified: Secondary | ICD-10-CM | POA: Insufficient documentation

## 2020-08-08 DIAGNOSIS — Z7984 Long term (current) use of oral hypoglycemic drugs: Secondary | ICD-10-CM | POA: Insufficient documentation

## 2020-08-08 DIAGNOSIS — Y92000 Kitchen of unspecified non-institutional (private) residence as  the place of occurrence of the external cause: Secondary | ICD-10-CM | POA: Diagnosis not present

## 2020-08-08 DIAGNOSIS — W19XXXA Unspecified fall, initial encounter: Secondary | ICD-10-CM | POA: Insufficient documentation

## 2020-08-08 DIAGNOSIS — I10 Essential (primary) hypertension: Secondary | ICD-10-CM | POA: Insufficient documentation

## 2020-08-08 DIAGNOSIS — E119 Type 2 diabetes mellitus without complications: Secondary | ICD-10-CM | POA: Insufficient documentation

## 2020-08-08 DIAGNOSIS — Z79899 Other long term (current) drug therapy: Secondary | ICD-10-CM | POA: Insufficient documentation

## 2020-08-08 DIAGNOSIS — S0003XA Contusion of scalp, initial encounter: Secondary | ICD-10-CM | POA: Insufficient documentation

## 2020-08-08 DIAGNOSIS — F0390 Unspecified dementia without behavioral disturbance: Secondary | ICD-10-CM | POA: Diagnosis not present

## 2020-08-08 DIAGNOSIS — S060X0A Concussion without loss of consciousness, initial encounter: Secondary | ICD-10-CM | POA: Diagnosis not present

## 2020-08-08 DIAGNOSIS — Y9301 Activity, walking, marching and hiking: Secondary | ICD-10-CM | POA: Insufficient documentation

## 2020-08-08 DIAGNOSIS — S0990XA Unspecified injury of head, initial encounter: Secondary | ICD-10-CM | POA: Diagnosis present

## 2020-08-08 DIAGNOSIS — Z7982 Long term (current) use of aspirin: Secondary | ICD-10-CM | POA: Diagnosis not present

## 2020-08-08 LAB — TROPONIN I (HIGH SENSITIVITY)
Troponin I (High Sensitivity): 7 ng/L (ref <18)
Troponin I (High Sensitivity): 7 ng/L (ref <18)

## 2020-08-08 LAB — CBC WITH DIFFERENTIAL/PLATELET
Abs Immature Granulocytes: 0.09 10*3/uL — ABNORMAL HIGH (ref 0.00–0.07)
Basophils Absolute: 0.1 10*3/uL (ref 0.0–0.1)
Basophils Relative: 1 %
Eosinophils Absolute: 0.3 10*3/uL (ref 0.0–0.5)
Eosinophils Relative: 3 %
HCT: 39.1 % (ref 39.0–52.0)
Hemoglobin: 13.4 g/dL (ref 13.0–17.0)
Immature Granulocytes: 1 %
Lymphocytes Relative: 19 %
Lymphs Abs: 1.7 10*3/uL (ref 0.7–4.0)
MCH: 33.2 pg (ref 26.0–34.0)
MCHC: 34.3 g/dL (ref 30.0–36.0)
MCV: 96.8 fL (ref 80.0–100.0)
Monocytes Absolute: 0.7 10*3/uL (ref 0.1–1.0)
Monocytes Relative: 8 %
Neutro Abs: 6.1 10*3/uL (ref 1.7–7.7)
Neutrophils Relative %: 68 %
Platelets: 208 10*3/uL (ref 150–400)
RBC: 4.04 MIL/uL — ABNORMAL LOW (ref 4.22–5.81)
RDW: 13 % (ref 11.5–15.5)
WBC: 8.9 10*3/uL (ref 4.0–10.5)
nRBC: 0 % (ref 0.0–0.2)

## 2020-08-08 LAB — COMPREHENSIVE METABOLIC PANEL
ALT: 22 U/L (ref 0–44)
AST: 28 U/L (ref 15–41)
Albumin: 3.8 g/dL (ref 3.5–5.0)
Alkaline Phosphatase: 79 U/L (ref 38–126)
Anion gap: 10 (ref 5–15)
BUN: 23 mg/dL (ref 8–23)
CO2: 21 mmol/L — ABNORMAL LOW (ref 22–32)
Calcium: 8.9 mg/dL (ref 8.9–10.3)
Chloride: 104 mmol/L (ref 98–111)
Creatinine, Ser: 1.58 mg/dL — ABNORMAL HIGH (ref 0.61–1.24)
GFR, Estimated: 42 mL/min — ABNORMAL LOW (ref >60)
Glucose, Bld: 182 mg/dL — ABNORMAL HIGH (ref 70–99)
Potassium: 4 mmol/L (ref 3.5–5.1)
Sodium: 135 mmol/L (ref 135–145)
Total Bilirubin: 1 mg/dL (ref 0.3–1.2)
Total Protein: 7 g/dL (ref 6.5–8.1)

## 2020-08-08 NOTE — ED Notes (Signed)
Given water. Will attempt to get urine after drinking.

## 2020-08-08 NOTE — ED Notes (Signed)
Pt and wife refusing urine sample. Pt wants to walk to car. Pt placed in wheelchair and taken to front. Wife verbalizes understanding.

## 2020-08-08 NOTE — ED Triage Notes (Signed)
First RN Note: Pt to ED via [facility] with c/o unwitnessed fall from home, per EMS pt's wife walked into kitchen and found patient sitting on floor in front of kitchen table chair, per EMS unable to determine if patient was standing or sitting. Per EMS pt with early stages of Alzheimers and is HOH. Per EMS pt denies any pain at this time.    CBG 186 78HR 120/70 96% RA

## 2020-08-08 NOTE — ED Triage Notes (Signed)
Upon arrival pt noted to be non-verbal to questions, pt able to nod his head to some questions. Pt denies remembering what happened, unable to answer if in pain at this time.

## 2020-08-08 NOTE — ED Provider Notes (Signed)
[facility] Emergency Department Provider Note   ____________________________________________   Event Date/Time   First MD Initiated Contact with Patient 08/08/20 1228     (approximate)  I have reviewed the triage vital signs and the nursing notes.   HISTORY  Chief Complaint Fall and Weakness    HPI Jerry Barrett is a 87 y.o. male with a past medical history of type 2 diabetes, hypertension, and dementia who presents via EMS after an unwitnessed fall from home.  Patient's wife is at bedside and provides history that she walked into the kitchen and found patient sitting on the floor in front of the kitchen table with a lesion to the back of his head that he was complaining of pain over.  Patient is a poor historian and is unable to provide additional history or review of systems at this time.         Past Medical History:  Diagnosis Date  . Dementia (HCC)   . Diabetes mellitus without complication (HCC)   . Hyperglycemia   . Hypertension   . Stroke (HCC)   . Thyroid disease     Patient Active Problem List   Diagnosis Date Noted  . Dry skin dermatitis 07/05/2020  . Bilateral hearing loss 12/07/2019  . Sepsis (HCC) 11/28/2019  . Pneumonia of right lung due to infectious organism   . Acute kidney injury superimposed on CKD (HCC)   . Sepsis due to gram-negative UTI (HCC) 11/27/2019  . Volume depletion   . Goals of care, counseling/discussion   . Palliative care by specialist   . Acute renal failure (HCC) 09/22/2019  . History of CVA with residual deficit 09/22/2019  . Cognitive deficit as late effect of cerebrovascular accident (CVA) 09/22/2019  . Hyperkalemia 09/22/2019  . Acute left-sided weakness 09/06/2019  . SIRS (systemic inflammatory response syndrome) (HCC) 07/19/2017  . Influenza A 07/19/2017  . Diabetes (HCC) 07/19/2017  . HTN (hypertension) 07/19/2017  . Hypothyroidism 07/19/2017  . HLD (hyperlipidemia) 07/19/2017  . Aortic  stenosis, mild 01/17/2016    Past Surgical History:  Procedure Laterality Date  . EYE SURGERY      Prior to Admission medications   Medication Sig Start Date End Date Taking? Authorizing Provider  aspirin 81 MG chewable tablet Chew by mouth daily.    [provider]  glipiZIDE (GLUCOTROL XL) 2.5 MG 24 hr tablet Take 2.5 mg by mouth daily with breakfast.  10/21/19 10/20/20  [provider]  levothyroxine (SYNTHROID, LEVOTHROID) 50 MCG tablet Take 50 mcg by mouth daily before breakfast.    [provider]  lisinopril (ZESTRIL) 2.5 MG tablet Take 2.5 mg by mouth daily. 11/24/19   [provider]    Allergies Cephalexin  Family History  Problem Relation Age of Onset  . Transient ischemic attack Mother     Social History Social History   Tobacco Use  . Smoking status: Never Smoker  . Smokeless tobacco: Never Used  Vaping Use  . Vaping Use: Never used  Substance Use Topics  . Alcohol use: No  . Drug use: Never    Review of Systems Unable to assess ____________________________________________   PHYSICAL EXAM:  VITAL SIGNS: ED Triage Vitals  Enc Vitals Group     BP 08/08/20 0907 130/67     Pulse Rate 08/08/20 0907 77     Resp 08/08/20 0907 18     Temp 08/08/20 0907 97.7 F (36.5 C)     Temp Source 08/08/20 0907 Oral  SpO2 08/08/20 0907 96 %     Weight 08/08/20 0924 170 lb (77.1 kg)     Height 08/08/20 0924 6' (1.829 m)     Head Circumference --      Peak Flow --      Pain Score --      Pain Loc --      Pain Edu? --      Excl. in GC? --    Constitutional: Alert and disoriented. Well appearing and in no acute distress. Eyes: Conjunctivae are normal. PERRL. Head: Posterior scalp hematoma Nose: No congestion/rhinnorhea. Mouth/Throat: Mucous membranes are moist. Neck: No stridor Cardiovascular: Grossly normal heart sounds.  Good peripheral circulation. Respiratory: Normal respiratory effort.  No  retractions. Gastrointestinal: Soft and nontender. No distention. Musculoskeletal: No obvious deformities Neurologic:  Normal speech and language. No gross focal neurologic deficits are appreciated. Skin:  Skin is warm and dry. No rash noted. Psychiatric: Mood and affect are normal. Speech and behavior are normal.  ____________________________________________   LABS (all labs ordered are listed, but only abnormal results are displayed)  Labs Reviewed  CBC WITH DIFFERENTIAL/PLATELET - Abnormal; Notable for the following components:      Result Value   RBC 4.04 (*)    Abs Immature Granulocytes 0.09 (*)    All other components within normal limits  COMPREHENSIVE METABOLIC PANEL - Abnormal; Notable for the following components:   CO2 21 (*)    Glucose, Bld 182 (*)    Creatinine, Ser 1.58 (*)    GFR, Estimated 42 (*)    All other components within normal limits  URINALYSIS, COMPLETE (UACMP) WITH MICROSCOPIC  TROPONIN I (HIGH SENSITIVITY)  TROPONIN I (HIGH SENSITIVITY)   ____________________________________________  EKG  ED ECG REPORT I, Merwyn Katos, the attending physician, personally viewed and interpreted this ECG.  Date: 08/08/2020 EKG Time: 0902 Rate: 77 Rhythm: normal sinus rhythm QRS Axis: normal Intervals: First-degree block ST/T Wave abnormalities: normal Narrative Interpretation: no evidence of acute ischemia  ____________________________________________  RADIOLOGY  ED MD interpretation: CT of the head shows no evidence of intracranial injury and a posterior scalp swelling without calvarial fracture  Official radiology report(s): CT Head Wo Contrast  Result Date: 08/08/2020 CLINICAL DATA:  Unwitnessed fall at home EXAM: CT HEAD WITHOUT CONTRAST TECHNIQUE: Contiguous axial images were obtained from the base of the skull through the vertex without intravenous contrast. COMPARISON:  11/30/2019 FINDINGS: Brain: No evidence of acute infarction, hemorrhage,  hydrocephalus, extra-axial collection or mass lesion/mass effect. Generalized cortical atrophy. Vascular: No hyperdense vessel or unexpected calcification. Skull: Left posterior scalp swelling.  No calvarial fracture. Sinuses/Orbits: No evidence of injury IMPRESSION: 1. No evidence of intracranial injury. 2. Posterior scalp swelling without calvarial fracture. 3. Generalized brain atrophy. Electronically Signed   By: Marnee Spring M.D.   On: 08/08/2020 10:01    ____________________________________________   PROCEDURES  Procedure(s) performed (including Critical Care):  .1-3 Lead EKG Interpretation Performed by: Merwyn Katos, MD Authorized by: Merwyn Katos, MD     Interpretation: normal     ECG rate:  74   ECG rate assessment: normal     Rhythm: sinus rhythm     Ectopy: none     Conduction: normal       ____________________________________________   INITIAL IMPRESSION / ASSESSMENT AND PLAN / ED COURSE  As part of my medical decision making, I reviewed the following data within the electronic MEDICAL RECORD NUMBER Nursing notes reviewed and incorporated, Labs reviewed, EKG interpreted, Old  chart reviewed, Radiograph reviewed and Notes from prior ED visits reviewed and incorporated        Patient presenting with head trauma.  Patient's neurological exam was non-focal and unremarkable.  [place] Head CT Rule was applied and patient did not fall into the low risk category so a head CT was obtained.  This showed no significant findings.  At this time, it is felt that the most likely explanation for the patient's symptoms is concussion.   I also considered SAH, SDH, Epidural Hematoma, IPH, skull fracture, migraine but this appears less likely considering the data gathered thus far.   Patient remained stable and neurologically intact while in the emergency department.  Discussed warning signs that would prompt return to ED.  Head trauma handout was provided.  Discussed in detail  concussion management.  No sports or strenuous activity until symptoms free.  Return to emergency department urgently if new or worsening symptoms develop.    Impression:  Concussion Posterior scalp hematoma  Disposition: Before reassessment could be made, patient eloped      ____________________________________________   FINAL CLINICAL IMPRESSION(S) / ED DIAGNOSES  Final diagnoses:  Injury of head, initial encounter  Fall, initial encounter  Hematoma of occipital region of scalp     ED Discharge Orders    None       Note:  This document was prepared using Dragon voice recognition software and may include unintentional dictation errors.   Merwyn Katos, MD 08/08/20 [phone]

## 2020-09-06 ENCOUNTER — Emergency Department
Admission: EM | Admit: 2020-09-06 | Discharge: 2020-09-06 | Disposition: A | Discharge status: Home / Self Care | Payer: Medicare Other | Attending: Emergency Medicine | Admitting: Emergency Medicine

## 2020-09-06 ENCOUNTER — Emergency Department: Payer: Medicare Other

## 2020-09-06 DIAGNOSIS — Z79899 Other long term (current) drug therapy: Secondary | ICD-10-CM | POA: Insufficient documentation

## 2020-09-06 DIAGNOSIS — Z7984 Long term (current) use of oral hypoglycemic drugs: Secondary | ICD-10-CM | POA: Insufficient documentation

## 2020-09-06 DIAGNOSIS — Z7982 Long term (current) use of aspirin: Secondary | ICD-10-CM | POA: Diagnosis not present

## 2020-09-06 DIAGNOSIS — E119 Type 2 diabetes mellitus without complications: Secondary | ICD-10-CM | POA: Diagnosis not present

## 2020-09-06 DIAGNOSIS — F0390 Unspecified dementia without behavioral disturbance: Secondary | ICD-10-CM | POA: Diagnosis not present

## 2020-09-06 DIAGNOSIS — E039 Hypothyroidism, unspecified: Secondary | ICD-10-CM | POA: Diagnosis not present

## 2020-09-06 DIAGNOSIS — R4182 Altered mental status, unspecified: Secondary | ICD-10-CM | POA: Diagnosis present

## 2020-09-06 DIAGNOSIS — I10 Essential (primary) hypertension: Secondary | ICD-10-CM | POA: Diagnosis not present

## 2020-09-06 DIAGNOSIS — R531 Weakness: Secondary | ICD-10-CM | POA: Diagnosis not present

## 2020-09-06 DIAGNOSIS — Z8673 Personal history of transient ischemic attack (TIA), and cerebral infarction without residual deficits: Secondary | ICD-10-CM | POA: Insufficient documentation

## 2020-09-06 LAB — URINALYSIS, COMPLETE (UACMP) WITH MICROSCOPIC
Bacteria, UA: NONE SEEN
Bilirubin Urine: NEGATIVE
Glucose, UA: 50 mg/dL — AB
Ketones, ur: NEGATIVE mg/dL
Leukocytes,Ua: NEGATIVE
Nitrite: NEGATIVE
Protein, ur: NEGATIVE mg/dL
Specific Gravity, Urine: 1.013 (ref 1.005–1.030)
Squamous Epithelial / HPF: NONE SEEN (ref 0–5)
pH: 6 (ref 5.0–8.0)

## 2020-09-06 LAB — COMPREHENSIVE METABOLIC PANEL
ALT: 15 U/L (ref 0–44)
AST: 22 U/L (ref 15–41)
Albumin: 3.4 g/dL — ABNORMAL LOW (ref 3.5–5.0)
Alkaline Phosphatase: 68 U/L (ref 38–126)
Anion gap: 8 (ref 5–15)
BUN: 20 mg/dL (ref 8–23)
CO2: 22 mmol/L (ref 22–32)
Calcium: 8.4 mg/dL — ABNORMAL LOW (ref 8.9–10.3)
Chloride: 108 mmol/L (ref 98–111)
Creatinine, Ser: 1.39 mg/dL — ABNORMAL HIGH (ref 0.61–1.24)
GFR, Estimated: 49 mL/min — ABNORMAL LOW (ref >60)
Glucose, Bld: 161 mg/dL — ABNORMAL HIGH (ref 70–99)
Potassium: 3.5 mmol/L (ref 3.5–5.1)
Sodium: 138 mmol/L (ref 135–145)
Total Bilirubin: 1 mg/dL (ref 0.3–1.2)
Total Protein: 6.3 g/dL — ABNORMAL LOW (ref 6.5–8.1)

## 2020-09-06 LAB — CBC WITH DIFFERENTIAL/PLATELET
Abs Immature Granulocytes: 0.05 10*3/uL (ref 0.00–0.07)
Basophils Absolute: 0.1 10*3/uL (ref 0.0–0.1)
Basophils Relative: 1 %
Eosinophils Absolute: 0.3 10*3/uL (ref 0.0–0.5)
Eosinophils Relative: 3 %
HCT: 36.9 % — ABNORMAL LOW (ref 39.0–52.0)
Hemoglobin: 13 g/dL (ref 13.0–17.0)
Immature Granulocytes: 1 %
Lymphocytes Relative: 19 %
Lymphs Abs: 1.8 10*3/uL (ref 0.7–4.0)
MCH: 33.7 pg (ref 26.0–34.0)
MCHC: 35.2 g/dL (ref 30.0–36.0)
MCV: 95.6 fL (ref 80.0–100.0)
Monocytes Absolute: 0.7 10*3/uL (ref 0.1–1.0)
Monocytes Relative: 7 %
Neutro Abs: 6.7 10*3/uL (ref 1.7–7.7)
Neutrophils Relative %: 69 %
Platelets: 179 10*3/uL (ref 150–400)
RBC: 3.86 MIL/uL — ABNORMAL LOW (ref 4.22–5.81)
RDW: 13.2 % (ref 11.5–15.5)
WBC: 9.6 10*3/uL (ref 4.0–10.5)
nRBC: 0 % (ref 0.0–0.2)

## 2020-09-06 NOTE — ED Triage Notes (Addendum)
EMS reports pt had a fall at 6am and EMS responded, pt was reportedly "normal" and did not come in. In the past hour pt's wife noted pt not responding verabally to her; EMS called. Pt reported to have difficulting with verbal communication. Per EMS, last known well time is "more than 4 hours."

## 2020-09-06 NOTE — ED Notes (Signed)
Patient transported to CT 

## 2020-09-06 NOTE — ED Notes (Signed)
Pt has been verbal since wife has arrived; he is oriented to person ,place, doesn't offer an answer as to why he is here. Verbalized he is ready to go home.

## 2020-09-06 NOTE — ED Provider Notes (Signed)
[facility] Emergency Department Provider Note   ____________________________________________   I have reviewed the triage vital signs and the nursing notes.   HISTORY  Chief Complaint Altered Mental Status   History limited by and level 5 caveat due to: AMS   HPI Jerry Barrett is a 87 y.o. male who presents to the emergency department today because of apparent altered mental status.  Patient himself is not able to give a good history.  Apparently the patient had a fall earlier today.  At that time was normal however EMS states that greater than 4-1/2 hours prior to arrival to the emergency department this visit wife noticed that the patient was less responsive.  The patient himself is only oriented to name and location.   Records reviewed. Per medical record review patient has a history of dementia, DM, HTN, CVA.   Past Medical History:  Diagnosis Date  . Dementia (HCC)   . Diabetes mellitus without complication (HCC)   . Hyperglycemia   . Hypertension   . Stroke (HCC)   . Thyroid disease     Patient Active Problem List   Diagnosis Date Noted  . Dry skin dermatitis 07/05/2020  . Bilateral hearing loss 12/07/2019  . Sepsis (HCC) 11/28/2019  . Pneumonia of right lung due to infectious organism   . Acute kidney injury superimposed on CKD (HCC)   . Sepsis due to gram-negative UTI (HCC) 11/27/2019  . Volume depletion   . Goals of care, counseling/discussion   . Palliative care by specialist   . Acute renal failure (HCC) 09/22/2019  . History of CVA with residual deficit 09/22/2019  . Cognitive deficit as late effect of cerebrovascular accident (CVA) 09/22/2019  . Hyperkalemia 09/22/2019  . Acute left-sided weakness 09/06/2019  . SIRS (systemic inflammatory response syndrome) (HCC) 07/19/2017  . Influenza A 07/19/2017  . Diabetes (HCC) 07/19/2017  . HTN (hypertension) 07/19/2017  . Hypothyroidism 07/19/2017  . HLD (hyperlipidemia) 07/19/2017   . Aortic stenosis, mild 01/17/2016    Past Surgical History:  Procedure Laterality Date  . EYE SURGERY      Prior to Admission medications   Medication Sig Start Date End Date Taking? Authorizing Provider  aspirin 81 MG chewable tablet Chew by mouth daily.    [provider]  glipiZIDE (GLUCOTROL XL) 2.5 MG 24 hr tablet Take 2.5 mg by mouth daily with breakfast.  10/21/19 10/20/20  [provider]  levothyroxine (SYNTHROID, LEVOTHROID) 50 MCG tablet Take 50 mcg by mouth daily before breakfast.    [provider]  lisinopril (ZESTRIL) 2.5 MG tablet Take 2.5 mg by mouth daily. 11/24/19   [provider]    Allergies Cephalexin  Family History  Problem Relation Age of Onset  . Transient ischemic attack Mother     Social History Social History   Tobacco Use  . Smoking status: Never Smoker  . Smokeless tobacco: Never Used  Vaping Use  . Vaping Use: Never used  Substance Use Topics  . Alcohol use: No  . Drug use: Never    Review of Systems Unable to obtain reliable ROS ____________________________________________   PHYSICAL EXAM:  VITAL SIGNS: ED Triage Vitals [09/06/20 1221]  Enc Vitals Group     BP 139/74     Pulse Rate 83     Resp 16     Temp 97.7 F (36.5 C)     Temp Source Oral     SpO2 95 %   Constitutional: Awake and alert. Not  completely oriented.  Eyes: Conjunctivae are normal.  ENT      Head: Normocephalic and atraumatic.      Nose: No congestion/rhinnorhea.      Mouth/Throat: Mucous membranes are moist.      Neck: No stridor. Hematological/Lymphatic/Immunilogical: No cervical lymphadenopathy. Cardiovascular: Normal rate, regular rhythm.  No murmurs, rubs, or gallops.  Respiratory: Normal respiratory effort without tachypnea nor retractions. Breath sounds are clear and equal bilaterally. No wheezes/rales/rhonchi. Gastrointestinal: Soft and non tender. No rebound. No guarding.  Genitourinary:  Deferred Musculoskeletal: Normal range of motion in all extremities. No lower extremity edema. Neurologic: Awake and alert. Not completely oriented. Able to move all extremities.  Skin:  Skin is warm, dry and intact. No rash noted.  ____________________________________________    LABS (pertinent positives/negatives)  CMP na 138, k 3.5, glu 161, cr 1.39 CBC wbc 9.6, hgb 13.0, plt 179 ____________________________________________   EKG  I, Phineas Semen, attending physician, personally viewed and interpreted this EKG  EKG Time: 1232 Rate: 79 Rhythm: sinus rhythm Axis: normal Intervals: qtc 459 QRS: RBBB ST changes: no st elevation Impression: abnormal ekg  ____________________________________________    RADIOLOGY  CT head No acute infarct.   ____________________________________________   PROCEDURES  Procedures  ____________________________________________   INITIAL IMPRESSION / ASSESSMENT AND PLAN / ED COURSE  Pertinent labs & imaging results that were available during my care of the patient were reviewed by me and considered in my medical decision making (see chart for details).   Patient presented to the emergency department today because of concerns for altered mental status.  On initial exam patient was slightly oriented.  He had symmetric strength.  Head CT without concerning findings.  Blood work and urine without obvious signs for infection, dehydration or concerning electrolyte abnormality.  Wife did arrive to the emergency department.  At the time of discharge patient was back to baseline.  Wife stated that she would like for him to come home.  She says that she thinks he might have just been a little tired from the excitement that occurred earlier in the day.  This time without any concerning laboratory findings I think is reasonable for patient be discharged home.  Will encourage primary care  follow-up.  ___________________________________________   FINAL CLINICAL IMPRESSION(S) / ED DIAGNOSES  Final diagnoses:  Weakness     Note: This dictation was prepared with Dragon dictation. Any transcriptional errors that result from this process are unintentional     Phineas Semen, MD 09/06/20 1534

## 2020-09-06 NOTE — Discharge Instructions (Addendum)
Please seek medical attention for any high fevers, chest pain, shortness of breath, change in behavior, persistent vomiting, bloody stool or any other new or concerning symptoms.  

## 2020-09-06 NOTE — ED Notes (Signed)
Pt returned from CT °

## 2020-11-13 IMAGING — CT CT HEAD W/O CM
3 series · 14 of 47 positions shown, 16 images · non-contrast
Comparison: CT head 11/27/2019

CLINICAL DATA: Head trauma, minor. Additional history provided:
Fall, hitting head earlier today.

EXAM:
CT HEAD WITHOUT CONTRAST
CT CERVICAL SPINE WITHOUT CONTRAST
TECHNIQUE: Multidetector CT imaging of the head and cervical spine was
performed following the standard protocol without intravenous
contrast. Multiplanar CT image reconstructions of the cervical spine
were also generated.

[Series 3: head wo · axial · 0.44mm/px · z∈[-151,-26]mm · 8 of 31 slices shown, 10 images]
[im 3/31  brain]
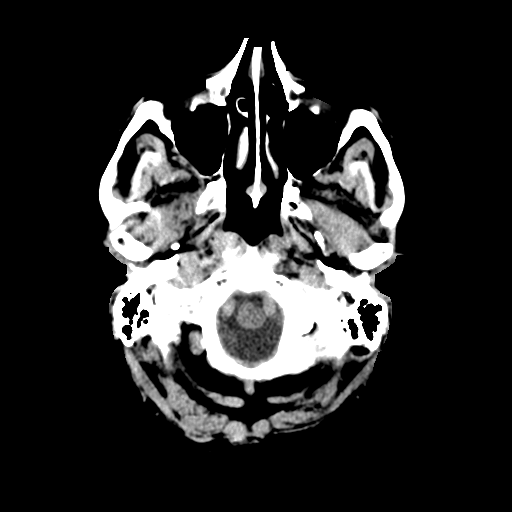
[im 3/31  bone]
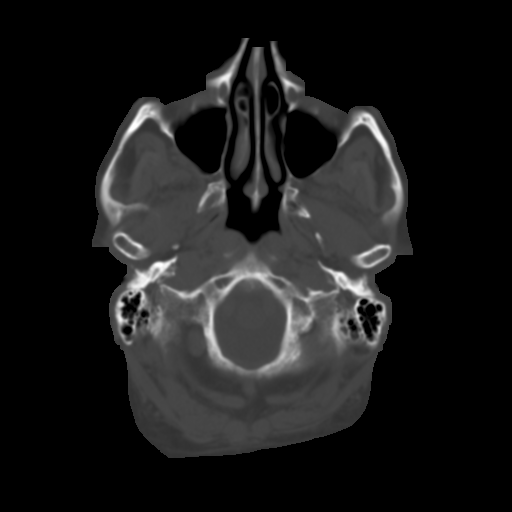
[im 7/31  brain]
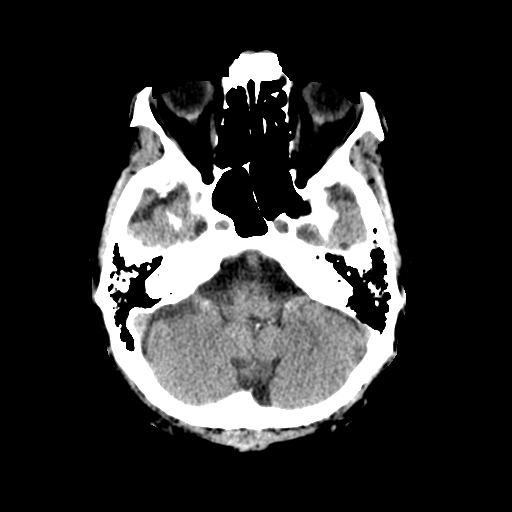
[im 10/31  brain]
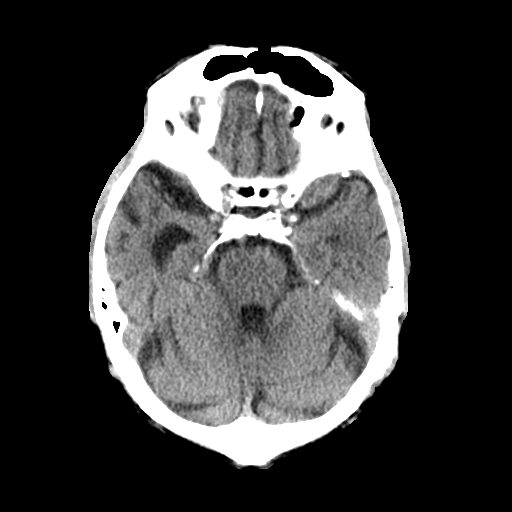
[im 14/31  brain]
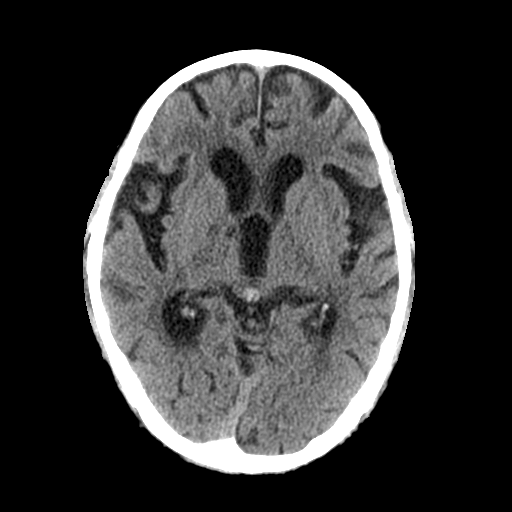
[im 17/31  brain]
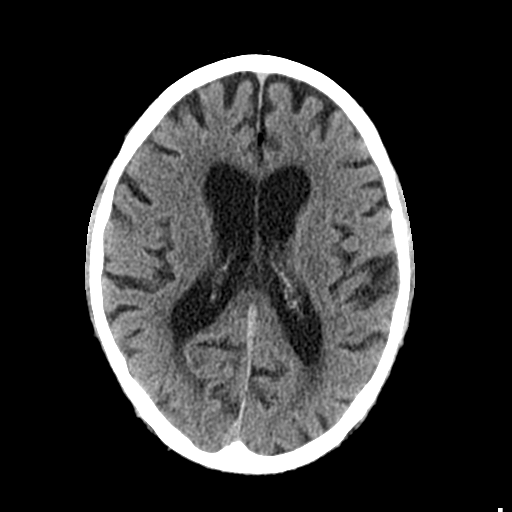
[im 17/31  bone]
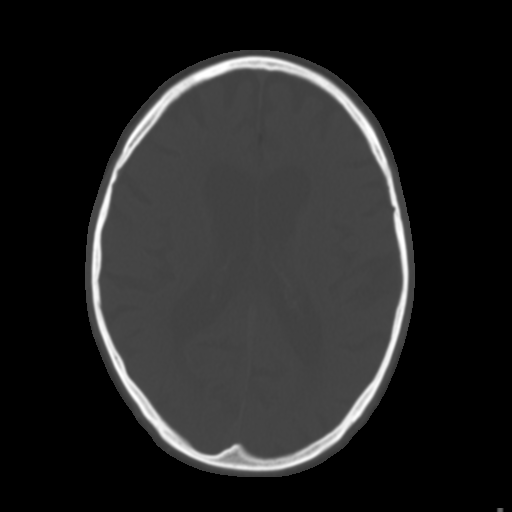
[im 21/31  brain]
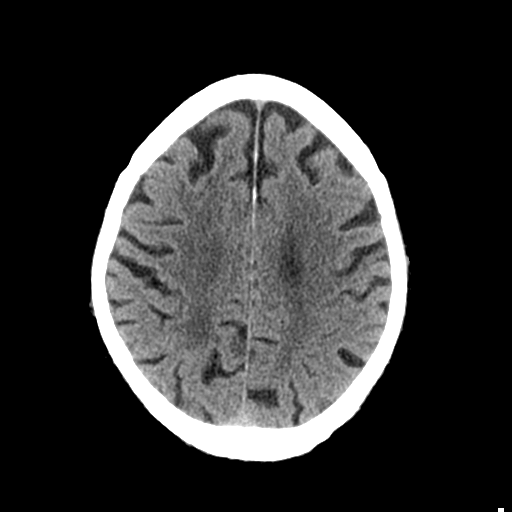
[im 24/31  brain]
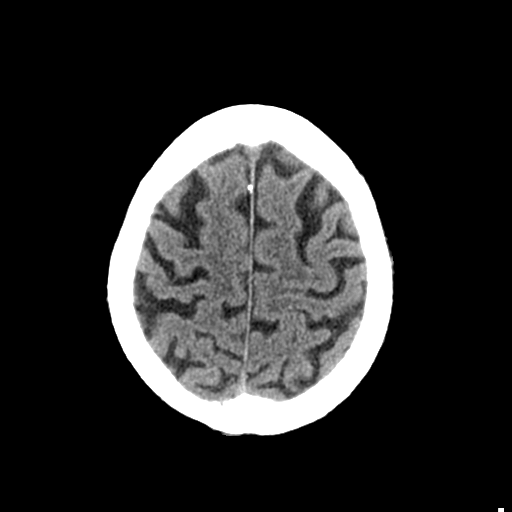
[im 28/31  brain]
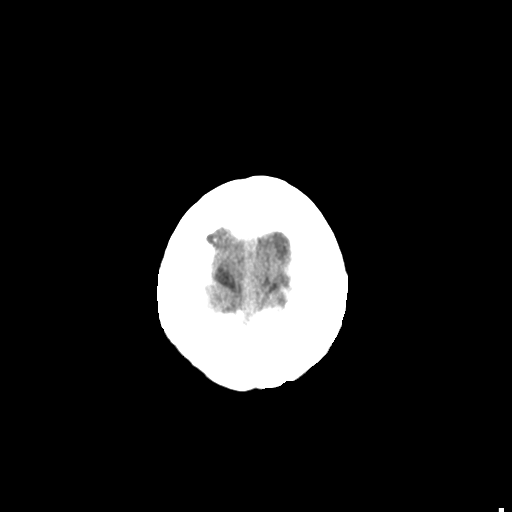

[Series 4: coronal soft tissue · coronal · 0.33mm/px · 3 of 72 slices shown]
[im 24/72  brain]
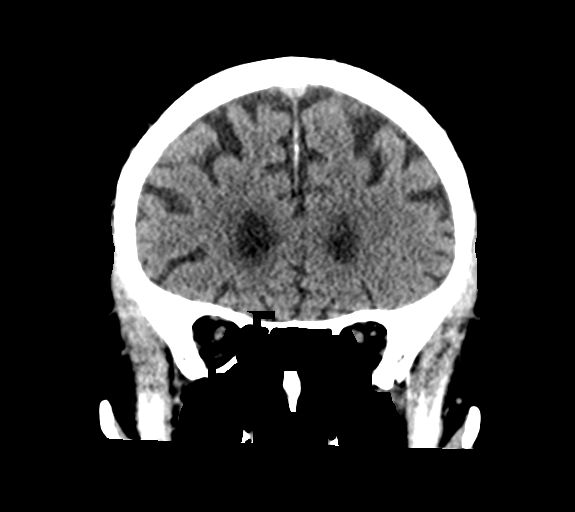
[im 32/72  brain]
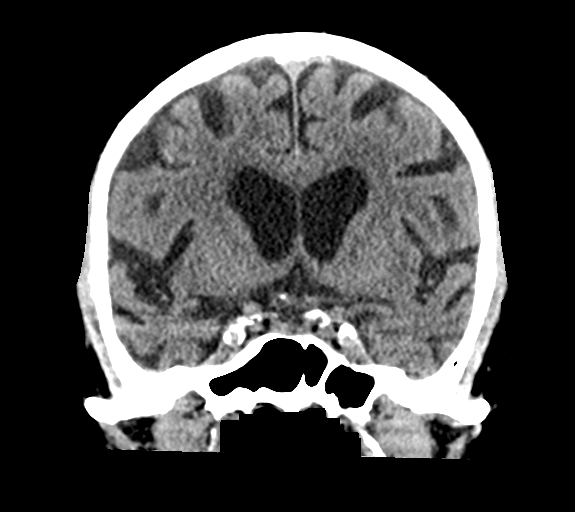
[im 40/72  brain]
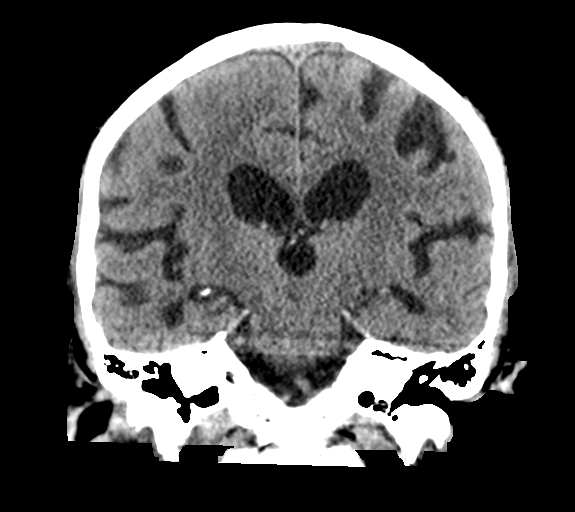

[Series 5: sagittal soft tissue · sagittal · 0.34mm/px · 3 of 55 slices shown]
[im 19/55  brain]
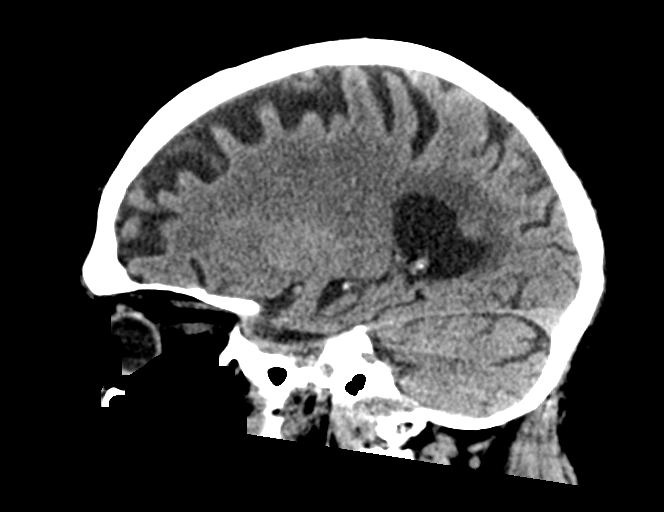
[im 28/55  brain]
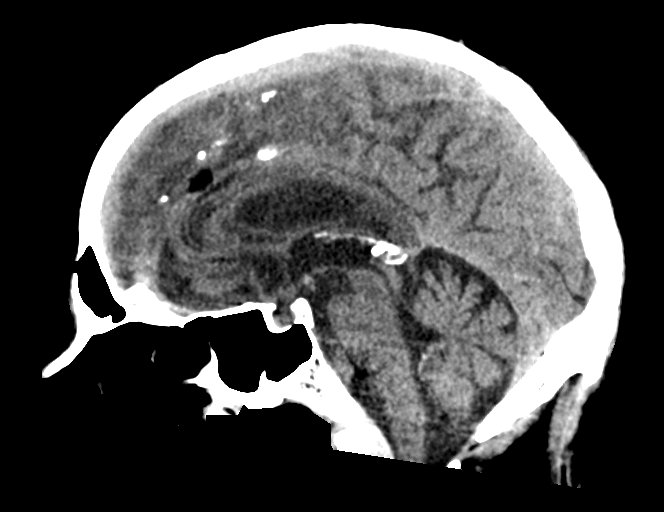
[im 37/55  brain]
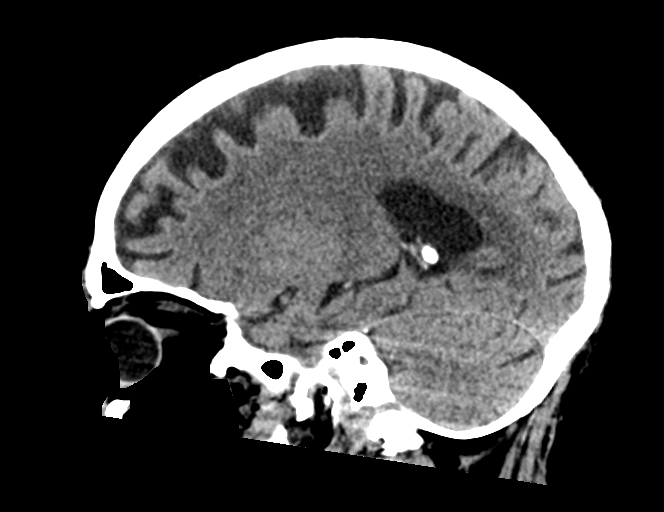

[14 of 47 positions shown; findings below may reference images not displayed]

FINDINGS: CT HEAD FINDINGS

Brain:

There is stable mild generalized parenchymal atrophy and chronic
small vessel ischemic disease.

There is no acute intracranial hemorrhage.

No demarcated cortical infarct.

No extra-axial fluid collection.

No evidence of intracranial mass.

No midline shift.

Vascular: No hyperdense vessel.  Atherosclerotic calcifications.

Skull: Normal. Negative for fracture or focal lesion.

Sinuses/Orbits: Visualized orbits show no acute finding. Right
frontal sinus osteoma. Mild ethmoid sinus mucosal thickening at the
imaged levels. No significant mastoid effusion.

Other: A small midline parietooccipital scalp hematoma is
questioned.

CT CERVICAL SPINE FINDINGS

Alignment: Trace C6-C7 grade 1 anterolisthesis.

Skull base and vertebrae: The basion-dental and atlanto-dental
intervals are maintained.No evidence of acute fracture to the
cervical spine.

Soft tissues and spinal canal: No prevertebral fluid or swelling. No
visible canal hematoma.

Disc levels: Cervical spondylosis with multilevel disc space
narrowing, uncovertebral and facet hypertrophy. Shallow posterior
disc osteophyte complexes at C3-C4 and C6-C7. There is degenerative
fusion of the posterior elements at multiple levels. Additionally,
there is degenerative fusion across the disc space at C3-C4.

Upper chest: No airspace consolidation or visible pneumothorax
within the imaged lung apices.

Other: Nonspecific subcutaneous cystic lesion within the posterior
upper neck soft tissues measuring 2.9 x 1.9 cm (series 3, image 37).
IMPRESSION: CT head:

1. No evidence of acute intracranial abnormality. A small midline
parietooccipital scalp hematoma is questioned.
2. Stable mild generalized parenchymal atrophy and chronic small
vessel ischemic disease.
3. Minimal ethmoid sinus mucosal thickening.

CT cervical spine:

1. No evidence of acute fracture to the cervical spine.
2. Cervical spondylosis as described. There is degenerative fusion
of the posterior elements at multiple levels. Additionally, there is
degenerative fusion across the disc space at C3-C4.
3. Nonspecific 2.9 cm subcutaneous cystic lesion within the
posterior upper neck soft tissues.

## 2021-02-05 ENCOUNTER — Telehealth: Payer: Self-pay | Admitting: Primary Care

## 2021-02-05 NOTE — Telephone Encounter (Signed)
T/c to wife to schedule palliative appt. Wife states things have been difficult with covid and a death in the family. Wife wants to leave it that she will call me if needed. No further follow up at this time.

## 2021-02-07 ENCOUNTER — Inpatient Hospital Stay: Payer: Medicare Other

## 2021-02-07 ENCOUNTER — Encounter: Payer: Self-pay | Admitting: Emergency Medicine

## 2021-02-07 ENCOUNTER — Emergency Department: Payer: Medicare Other

## 2021-02-07 ENCOUNTER — Inpatient Hospital Stay
Admission: EM | Admit: 2021-02-07 | Discharge: 2021-02-12 | DRG: 083 | Disposition: A | Discharge status: Skilled Nursing Facility | Payer: Medicare Other | Attending: Internal Medicine | Admitting: Internal Medicine

## 2021-02-07 ENCOUNTER — Other Ambulatory Visit: Payer: Self-pay

## 2021-02-07 DIAGNOSIS — N1832 Chronic kidney disease, stage 3b: Secondary | ICD-10-CM | POA: Diagnosis present

## 2021-02-07 DIAGNOSIS — L89151 Pressure ulcer of sacral region, stage 1: Secondary | ICD-10-CM | POA: Diagnosis present

## 2021-02-07 DIAGNOSIS — R402363 Coma scale, best motor response, obeys commands, at hospital admission: Secondary | ICD-10-CM | POA: Diagnosis present

## 2021-02-07 DIAGNOSIS — S50312A Abrasion of left elbow, initial encounter: Secondary | ICD-10-CM | POA: Diagnosis present

## 2021-02-07 DIAGNOSIS — I619 Nontraumatic intracerebral hemorrhage, unspecified: Secondary | ICD-10-CM

## 2021-02-07 DIAGNOSIS — R402243 Coma scale, best verbal response, confused conversation, at hospital admission: Secondary | ICD-10-CM | POA: Diagnosis present

## 2021-02-07 DIAGNOSIS — R402143 Coma scale, eyes open, spontaneous, at hospital admission: Secondary | ICD-10-CM | POA: Diagnosis present

## 2021-02-07 DIAGNOSIS — Z20822 Contact with and (suspected) exposure to covid-19: Secondary | ICD-10-CM | POA: Diagnosis present

## 2021-02-07 DIAGNOSIS — S06319A Contusion and laceration of right cerebrum with loss of consciousness of unspecified duration, initial encounter: Secondary | ICD-10-CM

## 2021-02-07 DIAGNOSIS — E039 Hypothyroidism, unspecified: Secondary | ICD-10-CM | POA: Diagnosis present

## 2021-02-07 DIAGNOSIS — R627 Adult failure to thrive: Secondary | ICD-10-CM | POA: Diagnosis present

## 2021-02-07 DIAGNOSIS — Z7984 Long term (current) use of oral hypoglycemic drugs: Secondary | ICD-10-CM

## 2021-02-07 DIAGNOSIS — E119 Type 2 diabetes mellitus without complications: Secondary | ICD-10-CM | POA: Diagnosis not present

## 2021-02-07 DIAGNOSIS — I69354 Hemiplegia and hemiparesis following cerebral infarction affecting left non-dominant side: Secondary | ICD-10-CM | POA: Diagnosis not present

## 2021-02-07 DIAGNOSIS — Z881 Allergy status to other antibiotic agents status: Secondary | ICD-10-CM | POA: Diagnosis not present

## 2021-02-07 DIAGNOSIS — E1122 Type 2 diabetes mellitus with diabetic chronic kidney disease: Secondary | ICD-10-CM | POA: Diagnosis present

## 2021-02-07 DIAGNOSIS — L8990 Pressure ulcer of unspecified site, unspecified stage: Secondary | ICD-10-CM | POA: Insufficient documentation

## 2021-02-07 DIAGNOSIS — F0150 Vascular dementia without behavioral disturbance: Secondary | ICD-10-CM | POA: Diagnosis present

## 2021-02-07 DIAGNOSIS — S06369A Traumatic hemorrhage of cerebrum, unspecified, with loss of consciousness of unspecified duration, initial encounter: Principal | ICD-10-CM | POA: Diagnosis present

## 2021-02-07 DIAGNOSIS — Z79899 Other long term (current) drug therapy: Secondary | ICD-10-CM | POA: Diagnosis not present

## 2021-02-07 DIAGNOSIS — Z7982 Long term (current) use of aspirin: Secondary | ICD-10-CM

## 2021-02-07 DIAGNOSIS — Z66 Do not resuscitate: Secondary | ICD-10-CM | POA: Diagnosis present

## 2021-02-07 DIAGNOSIS — W19XXXA Unspecified fall, initial encounter: Secondary | ICD-10-CM | POA: Diagnosis present

## 2021-02-07 DIAGNOSIS — S06349A Traumatic hemorrhage of right cerebrum with loss of consciousness of unspecified duration, initial encounter: Principal | ICD-10-CM

## 2021-02-07 DIAGNOSIS — Y92009 Unspecified place in unspecified non-institutional (private) residence as the place of occurrence of the external cause: Secondary | ICD-10-CM

## 2021-02-07 DIAGNOSIS — H9190 Unspecified hearing loss, unspecified ear: Secondary | ICD-10-CM | POA: Diagnosis present

## 2021-02-07 DIAGNOSIS — Z79890 Hormone replacement therapy (postmenopausal): Secondary | ICD-10-CM

## 2021-02-07 DIAGNOSIS — R5381 Other malaise: Secondary | ICD-10-CM | POA: Diagnosis present

## 2021-02-07 DIAGNOSIS — I129 Hypertensive chronic kidney disease with stage 1 through stage 4 chronic kidney disease, or unspecified chronic kidney disease: Secondary | ICD-10-CM | POA: Diagnosis present

## 2021-02-07 DIAGNOSIS — G8194 Hemiplegia, unspecified affecting left nondominant side: Secondary | ICD-10-CM | POA: Diagnosis not present

## 2021-02-07 LAB — BASIC METABOLIC PANEL
Anion gap: 7 (ref 5–15)
BUN: 19 mg/dL (ref 8–23)
CO2: 25 mmol/L (ref 22–32)
Calcium: 9 mg/dL (ref 8.9–10.3)
Chloride: 104 mmol/L (ref 98–111)
Creatinine, Ser: 1.38 mg/dL — ABNORMAL HIGH (ref 0.61–1.24)
GFR, Estimated: 50 mL/min — ABNORMAL LOW (ref >60)
Glucose, Bld: 139 mg/dL — ABNORMAL HIGH (ref 70–99)
Potassium: 3.9 mmol/L (ref 3.5–5.1)
Sodium: 136 mmol/L (ref 135–145)

## 2021-02-07 LAB — CBC
HCT: 39.5 % (ref 39.0–52.0)
Hemoglobin: 14.4 g/dL (ref 13.0–17.0)
MCH: 34.5 pg — ABNORMAL HIGH (ref 26.0–34.0)
MCHC: 36.5 g/dL — ABNORMAL HIGH (ref 30.0–36.0)
MCV: 94.7 fL (ref 80.0–100.0)
Platelets: 178 10*3/uL (ref 150–400)
RBC: 4.17 MIL/uL — ABNORMAL LOW (ref 4.22–5.81)
RDW: 12.9 % (ref 11.5–15.5)
WBC: 9.8 10*3/uL (ref 4.0–10.5)
nRBC: 0 % (ref 0.0–0.2)

## 2021-02-07 LAB — HEPATIC FUNCTION PANEL
ALT: 17 U/L (ref 0–44)
AST: 20 U/L (ref 15–41)
Albumin: 3.6 g/dL (ref 3.5–5.0)
Alkaline Phosphatase: 80 U/L (ref 38–126)
Bilirubin, Direct: 0.2 mg/dL (ref 0.0–0.2)
Indirect Bilirubin: 1.1 mg/dL — ABNORMAL HIGH (ref 0.3–0.9)
Total Bilirubin: 1.3 mg/dL — ABNORMAL HIGH (ref 0.3–1.2)
Total Protein: 6.8 g/dL (ref 6.5–8.1)

## 2021-02-07 LAB — PROTIME-INR
INR: 1 (ref 0.8–1.2)
Prothrombin Time: 13.5 seconds (ref 11.4–15.2)

## 2021-02-07 LAB — URINALYSIS, COMPLETE (UACMP) WITH MICROSCOPIC
Bacteria, UA: NONE SEEN
Bilirubin Urine: NEGATIVE
Glucose, UA: NEGATIVE mg/dL
Ketones, ur: NEGATIVE mg/dL
Leukocytes,Ua: NEGATIVE
Nitrite: NEGATIVE
Protein, ur: NEGATIVE mg/dL
Specific Gravity, Urine: 1.021 (ref 1.005–1.030)
Squamous Epithelial / HPF: NONE SEEN (ref 0–5)
pH: 5 (ref 5.0–8.0)

## 2021-02-07 LAB — RESP PANEL BY RT-PCR (FLU A&B, COVID) ARPGX2
Influenza A by PCR: NEGATIVE
Influenza B by PCR: NEGATIVE
SARS Coronavirus 2 by RT PCR: NEGATIVE

## 2021-02-07 LAB — TROPONIN I (HIGH SENSITIVITY)
Troponin I (High Sensitivity): 8 ng/L (ref <18)
Troponin I (High Sensitivity): 10 ng/L (ref <18)

## 2021-02-07 LAB — MAGNESIUM: Magnesium: 2.1 mg/dL (ref 1.7–2.4)

## 2021-02-07 LAB — TSH: TSH: 5.621 u[IU]/mL — ABNORMAL HIGH (ref 0.350–4.500)

## 2021-02-07 MED ORDER — LACTATED RINGERS IV BOLUS
1000.0000 mL | Freq: Once | INTRAVENOUS | Status: AC
  Administered 2021-02-07: 1000 mL via INTRAVENOUS

## 2021-02-07 MED ORDER — LISINOPRIL 5 MG PO TABS
2.5000 mg | ORAL_TABLET | Freq: Every day | ORAL | Status: DC
  Administered 2021-02-07 – 2021-02-12 (×6): 2.5 mg via ORAL
  Filled 2021-02-07 (×6): qty 1

## 2021-02-07 MED ORDER — LABETALOL HCL 5 MG/ML IV SOLN
10.0000 mg | Freq: Once | INTRAVENOUS | Status: AC
  Administered 2021-02-07: 10 mg via INTRAVENOUS
  Filled 2021-02-07: qty 4

## 2021-02-07 MED ORDER — ATORVASTATIN CALCIUM 20 MG PO TABS
20.0000 mg | ORAL_TABLET | Freq: Every day | ORAL | Status: DC
  Administered 2021-02-08 – 2021-02-11 (×4): 20 mg via ORAL
  Filled 2021-02-07 (×4): qty 1

## 2021-02-07 MED ORDER — LEVOTHYROXINE SODIUM 50 MCG PO TABS
75.0000 ug | ORAL_TABLET | Freq: Every day | ORAL | Status: DC
  Administered 2021-02-08 – 2021-02-12 (×5): 75 ug via ORAL
  Filled 2021-02-07 (×5): qty 1

## 2021-02-07 MED ORDER — LABETALOL HCL 5 MG/ML IV SOLN: 10.0000 mg | INTRAVENOUS | Status: DC | PRN

## 2021-02-07 MED ORDER — HYDRALAZINE HCL 20 MG/ML IJ SOLN
10.0000 mg | Freq: Once | INTRAMUSCULAR | Status: AC
  Administered 2021-02-07: 23:00:00 10 mg via INTRAVENOUS
  Filled 2021-02-07: qty 1

## 2021-02-07 MED ORDER — GLIPIZIDE ER 2.5 MG PO TB24
2.5000 mg | ORAL_TABLET | Freq: Every day | ORAL | Status: DC
  Administered 2021-02-08 – 2021-02-12 (×5): 2.5 mg via ORAL
  Filled 2021-02-07 (×5): qty 1

## 2021-02-07 NOTE — Consult Note (Signed)
Consult received.  Clinical history reviewed. Full note to follow.  87 yo with likely fall with nonfocal neurological examination.  HCT shows very small R parietal cortical contusion versus intraparenchymal hemorrhage.  - repeat HCT in 6 hours around 1130pm - if stable, no further imaging - admission likely due to home safety evaluation  Venetia Night MD

## 2021-02-07 NOTE — ED Triage Notes (Signed)
Pt comes into the ED via [facility] from home.  [facility] called EMS for wellness check.  Pt has fallen x2 outside so a neighbor noticed and informed his [facility] case worker.  Pt has bilateral leg swelling and oozing.  CBG 144.  Pt has urine odor present as well.  140/76, 95%, 86 HR. Apparently the neighbor who check in on him daily, states that he has been less mobile lately and was stuck on the porch today so EMS was called.

## 2021-02-07 NOTE — ED Provider Notes (Signed)
[facility] Emergency Department Provider Note  ____________________________________________   Event Date/Time   First MD Initiated Contact with Patient 02/07/21 1610     (approximate)  I have reviewed the triage vital signs and the nursing notes.   HISTORY  Chief Complaint Weakness   HPI Jerry Barrett is a 87 y.o. 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  Per wife he fell 2 days ago and hit his left elbow but she did not witness his fall.  Secondary to either very decreased auditory acuity, dementia or possibly a component of acute encephalopathy patient is unable provide any history on arrival.  Per wife he has not had any vomiting, cough, fevers diarrhea or other sick symptoms.  She is also some poor historian.         Past Medical History:  Diagnosis Date   Dementia (HCC)    Diabetes mellitus without complication (HCC)    Hyperglycemia    Hypertension    Stroke [facility])    Thyroid disease     Patient Active Problem List   Diagnosis Date Noted   Intraparenchymal hemorrhage of brain (HCC) 02/07/2021   Dry skin dermatitis 07/05/2020   Bilateral hearing loss 12/07/2019   Sepsis (HCC) 11/28/2019   Pneumonia of right lung due to infectious organism    Acute kidney injury superimposed on CKD (HCC)    Sepsis due to gram-negative UTI (HCC) 11/27/2019   Volume depletion    Goals of care, counseling/discussion    Palliative care by specialist    Acute renal failure (HCC) 09/22/2019   History of CVA with residual deficit 09/22/2019   Cognitive deficit as late effect of  cerebrovascular accident (CVA) 09/22/2019   Hyperkalemia 09/22/2019   Acute left-sided weakness 09/06/2019   SIRS (systemic inflammatory response syndrome) (HCC) 07/19/2017   Influenza A 07/19/2017   Diabetes (HCC) 07/19/2017   HTN (hypertension) 07/19/2017   Hypothyroidism 07/19/2017   HLD (hyperlipidemia) 07/19/2017   Aortic stenosis, mild 01/17/2016    Past Surgical History:  Procedure Laterality Date   EYE SURGERY      Prior to Admission medications   Medication Sig Start Date End Date Taking? Authorizing Provider  atorvastatin (LIPITOR) 20 MG tablet Take 20 mg by mouth at bedtime. 08/05/20  Yes [provider]  glipiZIDE (GLUCOTROL XL) 2.5 MG 24 hr tablet Take 2.5 mg by mouth daily with breakfast.  10/21/19 02/07/21 Yes [provider]  lisinopril (ZESTRIL) 2.5 MG tablet Take 2.5 mg by mouth daily. 11/24/19  Yes [provider]  aspirin 81 MG chewable tablet Chew by mouth daily.    [provider]    Allergies Cephalexin  Family History  Problem Relation Age of Onset   Transient ischemic attack Mother     Social History Social History   Tobacco Use   Smoking status: Never   Smokeless tobacco: Never  Vaping Use   Vaping Use: Never used  Substance Use Topics   Alcohol use: No   Drug use: Never    Review of Systems  Review of Systems  Unable to perform  ROS: Mental status change     ____________________________________________   PHYSICAL EXAM:  VITAL SIGNS: ED Triage Vitals  Enc Vitals Group     BP 02/07/21 1356 125/82     Pulse Rate 02/07/21 1356 90     Resp 02/07/21 1356 18     Temp 02/07/21 1356 98.8 F (37.1 C)     Temp Source 02/07/21 1356 Oral     SpO2 02/07/21 1356 97 %     Weight 02/07/21 1354 169 lb 15.6 oz (77.1 kg)     Height 02/07/21 1354 6' (1.829 m)     Head Circumference --      Peak Flow --      Pain Score 02/07/21 1352 0     Pain Loc --      Pain Edu? --      Excl. in GC? --    Vitals:    02/07/21 1900 02/07/21 2230  BP: (!) 159/114 (!) 147/74  Pulse:  83  Resp: 16 16  Temp:  98.2 F (36.8 C)  SpO2:  100%   Physical Exam Vitals and nursing note reviewed.  Constitutional:      Appearance: He is well-developed. He is ill-appearing.  HENT:     Head: Normocephalic and atraumatic.     Right Ear: External ear normal.     Left Ear: External ear normal.     Nose: Nose normal.  Eyes:     Conjunctiva/sclera: Conjunctivae normal.  Cardiovascular:     Rate and Rhythm: Normal rate and regular rhythm.     Heart sounds: No murmur heard. Pulmonary:     Effort: Pulmonary effort is normal. No respiratory distress.     Breath sounds: Normal breath sounds.  Abdominal:     Palpations: Abdomen is soft.     Tenderness: There is no abdominal tenderness.  Musculoskeletal:     Cervical back: Neck supple.     Right lower leg: Edema present.     Left lower leg: Edema present.  Skin:    General: Skin is warm and dry.  Neurological:     Mental Status: He is alert.     Gait: Gait abnormal (Patient unable to bear weight or ambulate from his wheelchair to the bed and requiring two-person assistance.).    Seems to move his extremities spontaneously and is able to move his hands in both upper extremities.  2+ radial pulse.  He does not follow any commands.  He does withdraw all extremities to tactile stimuli.  Pupils are symmetric and reactive to light bilaterally.  Lower extremities have fairly significant edema induration as well as flaking of skin.  There is no focal abscess deformity noted.  No step-offs apparent tenderness or other obvious trauma to the C, T, L-spine.  Abrasion over the left elbow.  Patient does appear disheveled. ____________________________________________   LABS (all labs ordered are listed, but only abnormal results are displayed)  Labs Reviewed  BASIC METABOLIC PANEL - Abnormal; Notable for the following components:      Result Value   Glucose, Bld 139 (*)     Creatinine, Ser 1.38 (*)    GFR, Estimated 50 (*)    All other components within normal limits  CBC - Abnormal; Notable for the following components:   RBC 4.17 (*)    MCH 34.5 (*)    MCHC 36.5 (*)    All other components within normal limits  URINALYSIS, COMPLETE (UACMP) WITH MICROSCOPIC - Abnormal; Notable for the following  components:   Color, Urine YELLOW (*)    APPearance CLEAR (*)    Hgb urine dipstick SMALL (*)    All other components within normal limits  HEPATIC FUNCTION PANEL - Abnormal; Notable for the following components:   Total Bilirubin 1.3 (*)    Indirect Bilirubin 1.1 (*)    All other components within normal limits  TSH - Abnormal; Notable for the following components:   TSH 5.621 (*)    All other components within normal limits  RESP PANEL BY RT-PCR (FLU A&B, COVID) ARPGX2  MAGNESIUM  PROTIME-INR  TROPONIN I (HIGH SENSITIVITY)  TROPONIN I (HIGH SENSITIVITY)   ____________________________________________  EKG  Sinus rhythm with right bundle branch block, multiple nonspecific changes in inferior and lateral as well as anterior leads with unremarkable intervals. ____________________________________________  RADIOLOGY  ED MD interpretation: CT head remarkable for a small focal intraparenchymal hemorrhage in the right parietal lobe.  No evidence of skull fracture or other acute intracranial bleeding.  There is advanced multilevel cervical spondylosis but no acute C-spine injuries noted on CT C-spine.  Chest x-ray has no effusion, edema, pneumothorax, rib fracture or other acute thoracic process.   Official radiology report(s): CT HEAD WO CONTRAST ( )  Result Date: 02/07/2021 CLINICAL DATA:  Mental status change, unknown cause; Neck trauma (Age >= 65y) EXAM: CT HEAD WITHOUT CONTRAST CT CERVICAL SPINE WITHOUT CONTRAST TECHNIQUE: Multidetector CT imaging of the head and cervical spine was performed following the standard protocol without intravenous contrast.  Multiplanar CT image reconstructions of the cervical spine were also generated. COMPARISON:  09/06/2020, 11/30/2019 FINDINGS: CT HEAD FINDINGS Brain: Small amount of hyperattenuating material within the peripheral aspect of the right parietal lobe (series 4, image 40), new from prior, suspicious for intraparenchymal hemorrhage. No mass effect or midline shift. No additional sites of intracranial hemorrhage. No extra-axial collection. No hydrocephalus. Scattered low-density changes within the periventricular and subcortical white matter compatible with chronic microvascular ischemic change. Mild diffuse cerebral volume loss. Vascular: Atherosclerotic calcifications involving the large vessels of the skull base. No unexpected hyperdense vessel. Skull: Normal. Negative for fracture or focal lesion. Sinuses/Orbits: Unchanged right frontal osteoma. Partial posterior left ethmoid air cell opacification. Otherwise clear. Other: Negative for scalp hematoma. CT CERVICAL SPINE FINDINGS Alignment: Facet joints are aligned without dislocation or traumatic listhesis. Dens and lateral masses are aligned. Unchanged facet mediated grade 1 anterolisthesis of C6 on C7. Skull base and vertebrae: No acute fracture. No primary bone lesion or focal pathologic process. Soft tissues and spinal canal: No prevertebral fluid or swelling. No visible canal hematoma. Disc levels: Advanced facet-predominant multilevel cervical spondylosis, not appreciably progressed from the prior CT. Upper chest: Included lung apices are clear. Other: Bilateral carotid atherosclerosis. IMPRESSION: 1. Small focal intraparenchymal hemorrhage in the right parietal lobe. 2. No acute fracture or traumatic listhesis of the cervical spine. 3. Advanced multilevel cervical spondylosis. These results were called by telephone at the time of interpretation on 02/07/2021 at 6:08 pm to provider [facility] , who verbally acknowledged these results. Electronically Signed   By:  Duanne Guess D.O.   On: 02/07/2021 18:08   CT Cervical Spine Wo Contrast  Result Date: 02/07/2021 CLINICAL DATA:  Mental status change, unknown cause; Neck trauma (Age >= 65y) EXAM: CT HEAD WITHOUT CONTRAST CT CERVICAL SPINE WITHOUT CONTRAST TECHNIQUE: Multidetector CT imaging of the head and cervical spine was performed following the standard protocol without intravenous contrast. Multiplanar CT image reconstructions of the cervical spine were also generated. COMPARISON:  09/06/2020, 11/30/2019 FINDINGS: CT HEAD FINDINGS Brain: Small amount of hyperattenuating material within the peripheral aspect of the right parietal lobe (series 4, image 40), new from prior, suspicious for intraparenchymal hemorrhage. No mass effect or midline shift. No additional sites of intracranial hemorrhage. No extra-axial collection. No hydrocephalus. Scattered low-density changes within the periventricular and subcortical white matter compatible with chronic microvascular ischemic change. Mild diffuse cerebral volume loss. Vascular: Atherosclerotic calcifications involving the large vessels of the skull base. No unexpected hyperdense vessel. Skull: Normal. Negative for fracture or focal lesion. Sinuses/Orbits: Unchanged right frontal osteoma. Partial posterior left ethmoid air cell opacification. Otherwise clear. Other: Negative for scalp hematoma. CT CERVICAL SPINE FINDINGS Alignment: Facet joints are aligned without dislocation or traumatic listhesis. Dens and lateral masses are aligned. Unchanged facet mediated grade 1 anterolisthesis of C6 on C7. Skull base and vertebrae: No acute fracture. No primary bone lesion or focal pathologic process. Soft tissues and spinal canal: No prevertebral fluid or swelling. No visible canal hematoma. Disc levels: Advanced facet-predominant multilevel cervical spondylosis, not appreciably progressed from the prior CT. Upper chest: Included lung apices are clear. Other: Bilateral carotid  atherosclerosis. IMPRESSION: 1. Small focal intraparenchymal hemorrhage in the right parietal lobe. 2. No acute fracture or traumatic listhesis of the cervical spine. 3. Advanced multilevel cervical spondylosis. These results were called by telephone at the time of interpretation on 02/07/2021 at 6:08 pm to provider [facility] , who verbally acknowledged these results. Electronically Signed   By: Duanne Guess D.O.   On: 02/07/2021 18:08   DG Chest Portable 1 View  Result Date: 02/07/2021 CLINICAL DATA:  Status post fall. EXAM: PORTABLE CHEST 1 VIEW COMPARISON:  November 27, 2019 FINDINGS: Low lung volumes are seen. Mild, chronic appearing increased lung markings are noted. There is no evidence of acute infiltrate, pleural effusion or pneumothorax. The heart size and mediastinal contours are within normal limits. Marked severity calcification of the aortic arch is noted. Degenerative changes seen throughout the thoracic spine. IMPRESSION: No active cardiopulmonary disease. Electronically Signed   By: Aram Candela M.D.   On: 02/07/2021 17:15    ____________________________________________   PROCEDURES  Procedure(s) performed (including Critical Care):  .1-3 Lead EKG Interpretation  Date/Time: 02/07/2021 10:52 PM Performed by: Gilles Chiquito, MD Authorized by: Gilles Chiquito, MD     Interpretation: normal     ECG rate assessment: normal     Rhythm: sinus rhythm     Ectopy: none     Conduction: normal     ____________________________________________   INITIAL IMPRESSION / ASSESSMENT AND PLAN / ED COURSE      Patient presents with above-stated history exam for assessment of some weakness and increasing falls in the last couple days.  On arrival patient appears disheveled and appears mildly altered although somewhat difficult to tell as wife is unable to clear describe baseline.  He is slight hypertensive otherwise stable vital signs on room air.  He is moving his extremities  without proximal little weaker on the left.  There is an abrasion of the left elbow but no other evidence of trauma on exam.  Differential includes encephalopathy from acute metabolic process, acute infectious process, spontaneous SAH, traumatic brain or C-spine injury, arrhythmia, anemia, ACS versus endocrine derangements.  CT head remarkable for a small focal intraparenchymal hemorrhage in the right parietal lobe.  No evidence of skull fracture or other acute intracranial bleeding.  There is advanced multilevel cervical spondylosis but no acute C-spine injuries noted on CT  C-spine.  Chest x-ray has no effusion, edema, pneumothorax, rib fracture or other acute thoracic process.   Sinus rhythm with right bundle branch block, multiple nonspecific changes in inferior and lateral as well as anterior leads with unremarkable intervals.  However troponin x2 is nonelevated and Evalose patient for ACS at this time.  BMP shows no significant electrolyte or metabolic derangements.  CBC shows no leukocytosis or acute anemia.  Magnesium is within normal limits.  It and flu is negative.  UA has some small hemoglobin but no evidence of infection.  TSH is slightly elevated at 5.6.  I discussed patient's CT head findings with an IPH with on-call neurosurgeon Dr. Marcell BarlowYarborough who recommended trending stability scan around midnight tonight and if this was stable patient would likely be okay for outpatient neurosurgery follow-up.  No indication for emergent intervention at this time.  Did recommend a systolic BP goal less than 160 but not initiating Keppra at this time.  Given significant weakness on exam with concern for possible traumatic IPH I will admit to medicine service for observation.  Will likely require PT and OT and I suspect may require either significant help at home or possible SNF placement.   ____________________________________________   FINAL CLINICAL IMPRESSION(S) / ED DIAGNOSES  Final diagnoses:   Intraparenchymal hematoma of brain, right, with loss of consciousness, initial encounter (HCC)  Fall, initial encounter  Abrasion of left elbow, initial encounter    Medications  hydrALAZINE (APRESOLINE) injection 10 mg (has no administration in time range)  labetalol (NORMODYNE) injection 10 mg (has no administration in time range)  lisinopril (ZESTRIL) tablet 2.5 mg (has no administration in time range)  glipiZIDE (GLUCOTROL XL) 24 hr tablet 2.5 mg (has no administration in time range)  atorvastatin (LIPITOR) tablet 20 mg (has no administration in time range)  levothyroxine (SYNTHROID) tablet 75 mcg (has no administration in time range)  lactated ringers bolus 1,000 mL (0 mLs Intravenous Stopped 02/07/21 1859)  labetalol (NORMODYNE) injection 10 mg (10 mg Intravenous Given 02/07/21 1828)     ED Discharge Orders     None        Note:  This document was prepared using Dragon voice recognition software and may include unintentional dictation errors.    Gilles ChiquitoSmith, Maizey Menendez P, MD 02/07/21 2252

## 2021-02-07 NOTE — ED Notes (Signed)
Patient transported to CT 

## 2021-02-07 NOTE — Progress Notes (Deleted)
Patient is due PD dialysis but nephrology can't do in the ED.  Patient will begin dialysis once on the floor.

## 2021-02-07 NOTE — ED Notes (Signed)
X-ray at bedside

## 2021-02-07 NOTE — H&P (Signed)
History and Physical    Jerry CrapeDonald W Sass ZOX:[id number] DOB: 05/03/1933 DOA: 02/07/2021  PCP: Rayetta HumphreyGeorge, Sionne A, MD  Chief Complaint: Fall  HPI: Jerry Barrett is a 87 y.o. male with a past medical history of non-insulin-dependent diabetes mellitus type 2, hypertension, hypothyroidism, history of CVA with some residual left-sided weakness, severely hard of hearing, and dementia, ambulatory dysfunction, chronic kidney disease stage IIIB.  This patient presented to the emergency department via EMS after APS was called when the neighbor noticed the patient had fallen twice in the last couple days.  Per his wife she also states he fell 2 days ago and his left elbow was injured but she did not witness the fall.  He cannot provide me any meaningful history upon my exam.  He can only tell me his name.  He appears disheveled and his wife cannot provide any significant history as well either.  She seems like a poor historian as well.  Able to move all 4 extremities. No evidence of trauma on his elbow or head.  CT head in the emergency department noted a small focal intraparenchymal hemorrhage in the right parietal lobe.  No acute C-spine injuries noted on CT C-spine.  Chest x-ray was fairly unremarkable as well.  The ED provider did get in touch with Dr. Myer HaffYarbrough neurosurgeon and he recommended obtaining repeat CT head at midnight.  He will also see the patient in the morning.  The patient is not on any blood thinners.  He also recommended keeping systolic blood pressure less than 160.  ED Course: CT head showed a small focal intraparenchymal hemorrhage in the right parietal lobe.  CXR was unremarkable.  COVID-19 PCR was negative.  Creatinine was elevated but at his baseline.  TSH was elevated.  Urinalysis was negative.  Review of Systems: 14 point review of systems is negative except for what is mentioned above in the HPI.   Past Medical History:  Diagnosis Date   Dementia (HCC)    Diabetes mellitus without  complication (HCC)    Hyperglycemia    Hypertension    Stroke (HCC)    Thyroid disease     Past Surgical History:  Procedure Laterality Date   EYE SURGERY      Social History   Socioeconomic History   Marital status: Married    Spouse name: Not on file   Number of children: Not on file   Years of education: Not on file   Highest education level: Not on file  Occupational History   Not on file  Tobacco Use   Smoking status: Never   Smokeless tobacco: Never  Vaping Use   Vaping Use: Never used  Substance and Sexual Activity   Alcohol use: No   Drug use: Never   Sexual activity: Not on file  Other Topics Concern   Not on file  Social History Narrative   Not on file   Social Determinants of Health   Financial Resource Strain: Not on file  Food Insecurity: Not on file  Transportation Needs: Not on file  Physical Activity: Not on file  Stress: Not on file  Social Connections: Not on file  Intimate Partner Violence: Not on file    Allergies  Allergen Reactions   Cephalexin Swelling and Rash    Family History  Problem Relation Age of Onset   Transient ischemic attack Mother     Prior to Admission medications   Medication Sig Start Date End Date Taking? Authorizing Provider  atorvastatin (  LIPITOR) 20 MG tablet Take 20 mg by mouth at bedtime. 08/05/20  Yes [provider]  glipiZIDE (GLUCOTROL XL) 2.5 MG 24 hr tablet Take 2.5 mg by mouth daily with breakfast.  10/21/19 02/07/21 Yes [provider]  lisinopril (ZESTRIL) 2.5 MG tablet Take 2.5 mg by mouth daily. 11/24/19  Yes [provider]  aspirin 81 MG chewable tablet Chew by mouth daily.    [provider]    Physical Exam: Vitals:   02/07/21 1700 02/07/21 1800 02/07/21 1830 02/07/21 1900  BP: (!) 158/83 (!) 165/88 (!) 155/133 (!) 159/114  Pulse: 85 85    Resp: 17 18 (!) 23 16  Temp:      TempSrc:      SpO2: 99% 99%    Weight:      Height:         General:  Disheveled appearing and smells of urine. Cardiovascular:  RRR, no m/r/g.  Respiratory:   CTA bilaterally with no wheezes/rales/rhonchi.  Normal respiratory effort. Abdomen:  soft, NT, ND, NABS Musculoskeletal:  grossly normal tone BUE/BLE, good ROM, no bony abnormality Lower extremity: Some edema of bilateral lower extremities with significant flaking of the skin on the anterior portion of his legs.  Psychiatric: Alert and awake and oriented to name only.  Unclear what his baseline is. Neurologic:  CN 2-12 grossly intact, moves all extremities in coordinated fashion.   Radiological Exams on Admission: Independently reviewed - see discussion in A/P where applicable  CT HEAD WO CONTRAST ( )  Result Date: 02/07/2021 CLINICAL DATA:  Mental status change, unknown cause; Neck trauma (Age >= 65y) EXAM: CT HEAD WITHOUT CONTRAST CT CERVICAL SPINE WITHOUT CONTRAST TECHNIQUE: Multidetector CT imaging of the head and cervical spine was performed following the standard protocol without intravenous contrast. Multiplanar CT image reconstructions of the cervical spine were also generated. COMPARISON:  09/06/2020, 11/30/2019 FINDINGS: CT HEAD FINDINGS Brain: Small amount of hyperattenuating material within the peripheral aspect of the right parietal lobe (series 4, image 40), new from prior, suspicious for intraparenchymal hemorrhage. No mass effect or midline shift. No additional sites of intracranial hemorrhage. No extra-axial collection. No hydrocephalus. Scattered low-density changes within the periventricular and subcortical white matter compatible with chronic microvascular ischemic change. Mild diffuse cerebral volume loss. Vascular: Atherosclerotic calcifications involving the large vessels of the skull base. No unexpected hyperdense vessel. Skull: Normal. Negative for fracture or focal lesion. Sinuses/Orbits: Unchanged right frontal osteoma. Partial posterior left ethmoid air cell opacification. Otherwise  clear. Other: Negative for scalp hematoma. CT CERVICAL SPINE FINDINGS Alignment: Facet joints are aligned without dislocation or traumatic listhesis. Dens and lateral masses are aligned. Unchanged facet mediated grade 1 anterolisthesis of C6 on C7. Skull base and vertebrae: No acute fracture. No primary bone lesion or focal pathologic process. Soft tissues and spinal canal: No prevertebral fluid or swelling. No visible canal hematoma. Disc levels: Advanced facet-predominant multilevel cervical spondylosis, not appreciably progressed from the prior CT. Upper chest: Included lung apices are clear. Other: Bilateral carotid atherosclerosis. IMPRESSION: 1. Small focal intraparenchymal hemorrhage in the right parietal lobe. 2. No acute fracture or traumatic listhesis of the cervical spine. 3. Advanced multilevel cervical spondylosis. These results were called by telephone at the time of interpretation on 02/07/2021 at 6:08 pm to provider [facility] , who verbally acknowledged these results. Electronically Signed   By: Duanne Guess D.O.   On: 02/07/2021 18:08   CT Cervical Spine Wo Contrast  Result Date: 02/07/2021 CLINICAL DATA:  Mental status  change, unknown cause; Neck trauma (Age >= 65y) EXAM: CT HEAD WITHOUT CONTRAST CT CERVICAL SPINE WITHOUT CONTRAST TECHNIQUE: Multidetector CT imaging of the head and cervical spine was performed following the standard protocol without intravenous contrast. Multiplanar CT image reconstructions of the cervical spine were also generated. COMPARISON:  09/06/2020, 11/30/2019 FINDINGS: CT HEAD FINDINGS Brain: Small amount of hyperattenuating material within the peripheral aspect of the right parietal lobe (series 4, image 40), new from prior, suspicious for intraparenchymal hemorrhage. No mass effect or midline shift. No additional sites of intracranial hemorrhage. No extra-axial collection. No hydrocephalus. Scattered low-density changes within the periventricular and subcortical  white matter compatible with chronic microvascular ischemic change. Mild diffuse cerebral volume loss. Vascular: Atherosclerotic calcifications involving the large vessels of the skull base. No unexpected hyperdense vessel. Skull: Normal. Negative for fracture or focal lesion. Sinuses/Orbits: Unchanged right frontal osteoma. Partial posterior left ethmoid air cell opacification. Otherwise clear. Other: Negative for scalp hematoma. CT CERVICAL SPINE FINDINGS Alignment: Facet joints are aligned without dislocation or traumatic listhesis. Dens and lateral masses are aligned. Unchanged facet mediated grade 1 anterolisthesis of C6 on C7. Skull base and vertebrae: No acute fracture. No primary bone lesion or focal pathologic process. Soft tissues and spinal canal: No prevertebral fluid or swelling. No visible canal hematoma. Disc levels: Advanced facet-predominant multilevel cervical spondylosis, not appreciably progressed from the prior CT. Upper chest: Included lung apices are clear. Other: Bilateral carotid atherosclerosis. IMPRESSION: 1. Small focal intraparenchymal hemorrhage in the right parietal lobe. 2. No acute fracture or traumatic listhesis of the cervical spine. 3. Advanced multilevel cervical spondylosis. These results were called by telephone at the time of interpretation on 02/07/2021 at 6:08 pm to provider [facility] , who verbally acknowledged these results. Electronically Signed   By: Duanne Guess D.O.   On: 02/07/2021 18:08   DG Chest Portable 1 View  Result Date: 02/07/2021 CLINICAL DATA:  Status post fall. EXAM: PORTABLE CHEST 1 VIEW COMPARISON:  November 27, 2019 FINDINGS: Low lung volumes are seen. Mild, chronic appearing increased lung markings are noted. There is no evidence of acute infiltrate, pleural effusion or pneumothorax. The heart size and mediastinal contours are within normal limits. Marked severity calcification of the aortic arch is noted. Degenerative changes seen throughout the  thoracic spine. IMPRESSION: No active cardiopulmonary disease. Electronically Signed   By: Aram Candela M.D.   On: 02/07/2021 17:15    EKG: Independently reviewed.  NSR with rate 89   Labs on Admission: I have personally reviewed the available labs and imaging studies at the time of the admission.  Pertinent labs: Creatinine 1.38, blood glucose 139, TSH 5     Assessment/Plan: Suspected mechanical fall with small focal IPH: CT head showed small focal intraparenchymal hemorrhage of the right parietal lobe.  Neurosurgery aware and recommended repeating a CT head at midnight.  They will be following in consultation.  PT/OT have been ordered.  TOC has been consulted.  I am concerned that the patient is unable to take care of himself at home and the wife is also unable to take care of him as well. Will likely need SNF.  Fall precautions have been ordered.  Neurosurgery recommended goal systolic blood pressure less than 160.  Labetalol ordered as needed ordered for SBP greater than 160.  Hypertension: Continue home Zestril  History of CVA: Continue home aspirin and Lipitor  Non-insulin-dependent diabetes mellitus type 2: Continue home glipizide  Uncontrolled hypothyroidism: TSH elevated at 5.  Previously on Synthroid  50 mcg daily.  We will increase Synthroid to 75 mcg daily.  Repeat TSH in 6 weeks.  Ambulatory dysfunction: PT/OT  Dementia: No acute treatment  Level of Care: MedSurg DVT prophylaxis: SCDs Code Status: DNR Consults: Neurosurgery Admission status: Inpatient   Verdia Kuba DO [facility]   How to contact the [facility] Attending or Consulting provider 7A - 7P or covering provider during after hours 7P -7A, for this patient?  Check the care team in [facility] and look for a) attending/consulting [facility] provider listed and b) the [facility] team listed Log into [email or web address] and use [place]'s universal password to access. If you do not have the password, please contact the  hospital operator. Locate the [facility] provider you are looking for under [facility] and page to a number that you can be directly reached. If you still have difficulty reaching the provider, please page the [facility] (Director on Call) for the Hospitalists listed on amion for assistance.   02/07/2021, 9:33 PM

## 2021-02-08 ENCOUNTER — Inpatient Hospital Stay: Payer: Medicare Other

## 2021-02-08 DIAGNOSIS — R627 Adult failure to thrive: Secondary | ICD-10-CM

## 2021-02-08 DIAGNOSIS — G8194 Hemiplegia, unspecified affecting left nondominant side: Secondary | ICD-10-CM

## 2021-02-08 DIAGNOSIS — E119 Type 2 diabetes mellitus without complications: Secondary | ICD-10-CM

## 2021-02-08 DIAGNOSIS — W19XXXA Unspecified fall, initial encounter: Secondary | ICD-10-CM

## 2021-02-08 DIAGNOSIS — L8990 Pressure ulcer of unspecified site, unspecified stage: Secondary | ICD-10-CM | POA: Insufficient documentation

## 2021-02-08 DIAGNOSIS — F0150 Vascular dementia without behavioral disturbance: Secondary | ICD-10-CM

## 2021-02-08 MED ORDER — LORAZEPAM 2 MG/ML IJ SOLN
1.0000 mg | Freq: Once | INTRAMUSCULAR | Status: AC
  Administered 2021-02-09: 1 mg via INTRAVENOUS
  Filled 2021-02-08: qty 1

## 2021-02-08 NOTE — Evaluation (Signed)
Occupational Therapy Evaluation Patient Details Name: Jerry Barrett MRN: [id number] DOB: 01-22-1933 Today's Date: 02/08/2021    History of Present Illness 87 year old male with a medical history of diabetes, hypertension, hypothyroidism, profound hearing loss, dementia, and residual left hemibody weakness from a previous CVA presenting to the ER after multiple falls and increased confusion.  Unfortunately due to the patient's dementia and difficulty with hearing, he is unable to provide any significant history and his wife is a particularly poor historian.   Clinical Impression   Patient presenting with decreased I in self care, balance, functional mobility/transfer, endurance, and safety awareness. Pt's wife present in room who endorses pt needs some assist at home for LB dressing and ambulates short distance PTA. Pt's wife also appears to be a poor historian and pt with cognitive deficits at this time. Patient currently functioning at min - mod A of 2. Patient will benefit from acute OT to increase overall independence in the areas of ADLs, functional mobility, and safety awareness in order to safely discharge to next venue of care.    Follow Up Recommendations  SNF;Supervision/Assistance - 24 hour    Equipment Recommendations  Other (comment) (defer to next venue of care)       Precautions / Restrictions Precautions Precautions: Fall Restrictions Weight Bearing Restrictions: No      Mobility Bed Mobility Overal bed mobility: Needs Assistance Bed Mobility: Sit to Supine       Sit to supine: Min assist   General bed mobility comments: min A for trunk support and mod multimodal cuing    Transfers Overall transfer level: Needs assistance Equipment used: 2 person hand held assist Transfers: Sit to/from Stand Sit to Stand: Min assist;Mod assist         General transfer comment: mod A of 2 initially to stand with pt ambulating with min A of 2 for safety ~ 20'    Balance  Overall balance assessment: Needs assistance Sitting-balance support: Feet supported;Bilateral upper extremity supported Sitting balance-Leahy Scale: Fair     Standing balance support: During functional activity;Bilateral upper extremity supported Standing balance-Leahy Scale: Poor Standing balance comment: B UEs supported                           ADL either performed or assessed with clinical judgement   ADL Overall ADL's : Needs assistance/impaired     Grooming: Brushing hair;Sitting;Set up;Moderate assistance                                       Vision Patient Visual Report: No change from baseline              Pertinent Vitals/Pain Pain Assessment: Faces Faces Pain Scale: Hurts little more Pain Location: back Pain Descriptors / Indicators: Discomfort Pain Intervention(s): Monitored during session;Repositioned     Hand Dominance Right   Extremity/Trunk Assessment Upper Extremity Assessment Upper Extremity Assessment: Generalized weakness   Lower Extremity Assessment Lower Extremity Assessment: Generalized weakness       Communication Communication Communication: HOH   Cognition Arousal/Alertness: Awake/alert Behavior During Therapy: Restless Overall Cognitive Status: Difficult to assess                                 General Comments: Pt lives with wife and this session they appear to  BOTH be cognitively impaired. Unable to get clear picture in regards to pt's cognition at baseline. Pt needing mod - max multimodal cuing for tasks and increased time to initiate.              Home Living Family/patient expects to be discharged to:: Private residence Living Arrangements: Spouse/significant other Available Help at Discharge: Family;Available 24 hours/day Type of Home: House Home Access: Stairs to enter [employer] of Steps: 3 Entrance Stairs-Rails: Left Home Layout: Two level;Able to live on main  level with bedroom/bathroom         Bathroom Toilet: Handicapped height     Home Equipment: Bedside commode;Walker - 2 wheels   Additional Comments: Home set up information obtained from previous hospital admission ( June 2021). Pt's and wife appeared to be cognitively impaired. Unable to obtain information from either of them when asked.      Prior Functioning/Environment Level of Independence: Needs assistance        Comments: When asked what pt was doing at baseline she was able to report "walks short distances" and "needs help with getting on socks and shoes from stroke because his toes are long"        OT Problem List: Decreased strength;Decreased activity tolerance;Impaired balance (sitting and/or standing);Decreased safety awareness      OT Treatment/Interventions: Self-care/ADL training;Therapeutic exercise;Therapeutic activities;Cognitive remediation/compensation;Energy conservation;DME and/or AE instruction;Patient/family education;Balance training    OT Goals(Current goals can be found in the care plan section) Acute Rehab OT Goals Patient Stated Goal: "get these mitts off of me" OT Goal Formulation: With patient Time For Goal Achievement: 02/22/21 Potential to Achieve Goals: Fair  OT Frequency: Min 2X/week   Barriers to D/C: Decreased caregiver support  wife appears unable to provide assist to husband and appears to have cognitive deficits herself          AM-PAC OT "6 Clicks" Daily Activity     Outcome Measure Help from another person eating meals?: None Help from another person taking care of personal grooming?: A Little Help from another person toileting, which includes using toliet, bedpan, or urinal?: A Lot Help from another person bathing (including washing, rinsing, drying)?: A Lot Help from another person to put on and taking off regular upper body clothing?: A Little Help from another person to put on and taking off regular lower body clothing?: A  Lot 6 Click Score: 16   End of Session Nurse Communication: Mobility status;Precautions  Activity Tolerance: Patient tolerated treatment well Patient left: in bed;with call bell/phone within reach;with bed alarm set;with family/visitor present  OT Visit Diagnosis: Unsteadiness on feet (R26.81);Repeated falls (R29.6);Muscle weakness (generalized) (M62.81)                Time: 6712-4580 OT Time Calculation (min): 11 min Charges:  OT General Charges $OT Visit: 1 Visit OT Evaluation $OT Eval Moderate Complexity: 1 [address], [place], OTR/L , CBIS ascom [phone]  02/08/21, 1:57 PM

## 2021-02-08 NOTE — Consult Note (Signed)
Neurosurgery-New Consultation Evaluation 02/08/2021 Jerry Barrett [id number]  Identifying Statement: Jerry Barrett is a 87 y.o. male from [place] [address] with   Physician Requesting Consultation: No ref. provider found  History of Present Illness:  Jerry Barrett is a 87 year old male with a medical history of diabetes, hypertension, hypothyroidism, profound hearing loss, dementia, and residual left hemibody weakness from a previous CVA presenting to the ER after multiple falls and increased confusion. Unfortunately due to the patient's dementia and difficulty with hearing, he is unable to provide any significant history and his wife is a particularly poor historian.  When asked what happened she continues to repeat that he has not eaten since last night and continues to attempt to feed him.   Past Medical History:  Past Medical History:  Diagnosis Date   Dementia (HCC)    Diabetes mellitus without complication (HCC)    Hyperglycemia    Hypertension    Stroke [facility])    Thyroid disease     Social History: Social History   Socioeconomic History   Marital status: Married    Spouse name: Not on file   Number of children: Not on file   Years of education: Not on file   Highest education level: Not on file  Occupational History   Not on file  Tobacco Use   Smoking status: Never   Smokeless tobacco: Never  Vaping Use   Vaping Use: Never used  Substance and Sexual Activity   Alcohol use: No   Drug use: Never   Sexual activity: Not on file  Other Topics Concern   Not on file  Social History Narrative   Not on file   Social Determinants of Health   Financial Resource Strain: Not on file  Food Insecurity: Not on file  Transportation Needs: Not on file  Physical Activity: Not on file  Stress: Not on file  Social Connections: Not on file  Intimate Partner Violence: Not on file   Living arrangements (living alone, with partner): With his wife  Family  History: Family History  Problem Relation Age of Onset   Transient ischemic attack Mother     Review of Systems:  Unable to obtain  Physical Exam: BP 135/65   Pulse 79   Temp 98.1 F (36.7 C) (Oral)   Resp 17   Ht 6' (1.829 m)   Wt 81.3 kg   SpO2 99%   BMI 24.31 kg/m  Body mass index is 24.31 kg/m. Body surface area is 2.03 meters squared. General appearance: Alert.  Head: Normocephalic, atraumatic Eyes: Normal, Neck: Supple, no tenderness Abdomen: Soft, nondistended  Neurologic exam:  Mental status: alertness: alert, oriented to self only Speech: fluent and clear Cranial nerves:  Patient is following hard of hearing otherwise cranial nerves are grossly intact Motor: Is unable to follow simple one-step commands however he does independently move all extremities to gravity spontaneously. Reflexes: 1+ and symmetric bilaterally for arms and legs  Laboratory: Results for orders placed or performed during the hospital encounter of 02/07/21  Resp Panel by RT-PCR (Flu A&B, Covid) Nasopharyngeal Swab   Specimen: Nasopharyngeal Swab; Nasopharyngeal(NP) swabs in vial transport medium  Result Value Ref Range   SARS Coronavirus 2 by RT PCR NEGATIVE NEGATIVE   Influenza A by PCR NEGATIVE NEGATIVE   Influenza B by PCR NEGATIVE NEGATIVE  Basic metabolic panel  Result Value Ref Range   Sodium 136 135 - 145 mmol/L   Potassium 3.9 3.5 - 5.1 mmol/L  Chloride 104 98 - 111 mmol/L   CO2 25 22 - 32 mmol/L   Glucose, Bld 139 (H) 70 - 99 mg/dL   BUN 19 8 - 23 mg/dL   Creatinine, Ser 3.15 (H) 0.61 - 1.24 mg/dL   Calcium 9.0 8.9 - 40.0 mg/dL   GFR, Estimated 50 (L) >60 mL/min   Anion gap 7 5 - 15  CBC  Result Value Ref Range   WBC 9.8 4.0 - 10.5 K/uL   RBC 4.17 (L) 4.22 - 5.81 MIL/uL   Hemoglobin 14.4 13.0 - 17.0 g/dL   HCT 86.7 61.9 - 50.9 %   MCV 94.7 80.0 - 100.0 fL   MCH 34.5 (H) 26.0 - 34.0 pg   MCHC 36.5 (H) 30.0 - 36.0 g/dL   RDW 32.6 71.2 - 45.8 %   Platelets 178 150 -  400 K/uL   nRBC 0.0 0.0 - 0.2 %  Urinalysis, Complete w Microscopic Urine, Catheterized  Result Value Ref Range   Color, Urine YELLOW (A) YELLOW   APPearance CLEAR (A) CLEAR   Specific Gravity, Urine 1.021 1.005 - 1.030   pH 5.0 5.0 - 8.0   Glucose, UA NEGATIVE NEGATIVE mg/dL   Hgb urine dipstick SMALL (A) NEGATIVE   Bilirubin Urine NEGATIVE NEGATIVE   Ketones, ur NEGATIVE NEGATIVE mg/dL   Protein, ur NEGATIVE NEGATIVE mg/dL   Nitrite NEGATIVE NEGATIVE   Leukocytes,Ua NEGATIVE NEGATIVE   RBC / HPF 11-20 0 - 5 RBC/hpf   WBC, UA 0-5 0 - 5 WBC/hpf   Bacteria, UA NONE SEEN NONE SEEN   Squamous Epithelial / LPF NONE SEEN 0 - 5   Mucus PRESENT   Hepatic function panel  Result Value Ref Range   Total Protein 6.8 6.5 - 8.1 g/dL   Albumin 3.6 3.5 - 5.0 g/dL   AST 20 15 - 41 U/L   ALT 17 0 - 44 U/L   Alkaline Phosphatase 80 38 - 126 U/L   Total Bilirubin 1.3 (H) 0.3 - 1.2 mg/dL   Bilirubin, Direct 0.2 0.0 - 0.2 mg/dL   Indirect Bilirubin 1.1 (H) 0.3 - 0.9 mg/dL  Magnesium  Result Value Ref Range   Magnesium 2.1 1.7 - 2.4 mg/dL  TSH  Result Value Ref Range   TSH 5.621 (H) 0.350 - 4.500 uIU/mL  Protime-INR  Result Value Ref Range   Prothrombin Time 13.5 11.4 - 15.2 seconds   INR 1.0 0.8 - 1.2  Troponin I (High Sensitivity)  Result Value Ref Range   Troponin I (High Sensitivity) 8 <18 ng/L  Troponin I (High Sensitivity)  Result Value Ref Range   Troponin I (High Sensitivity) 10 <18 ng/L   I personally reviewed labs  Imaging: IMPRESSION: Unchanged small focus of cortical hemorrhage in the anterior right parietal lobe.  Electronically Signed   By: Deatra Robinson M.D.   On: 02/08/2021 00:38   Impression/Plan:   Jerry Barrett is a 87 y.o presenting after a fall. Serial HCT show stable small right parietal lobe hemorrhage.  While Jerry Barrett's exam is difficult to obtain due to his hearing loss and dementia, there are no concerning signs for lateralization or focal neurologic  deficit.  1.  Diagnosis: right parietal traumatic hemorrhage  2.  Plan - no need for further cranial imaging should exam remain stable - Additional care per internal medicine's recommendations. -Indication for further neurosurgerical intervention at this time. - Please contact neurosurgery with any questions or concerns.  Manning Charity PA-C Neurosurgery

## 2021-02-08 NOTE — NC FL2 (Signed)
[place] MEDICAID FL2 LEVEL OF CARE SCREENING TOOL     IDENTIFICATION  Patient Name: Jerry Barrett Birthdate: [birth year] Sex: male Admission Date (Current Location): 02/07/2021  [facility] [place] Number:  Chiropodist and Address:  [facility], [address] [place], [place] [address]      Provider Number: [id number]  Attending Physician Name and Address:  Albertine Grates, MD  Relative Name and Phone Number:       Current Level of Care: Hospital Recommended Level of Care: Skilled Nursing Facility Prior Approval Number:    Date Approved/Denied:   PASRR Number: [id number]  Discharge Plan: SNF    Current Diagnoses: Patient Active Problem List   Diagnosis Date Noted   Intraparenchymal hemorrhage of brain (HCC) 02/07/2021   Dry skin dermatitis 07/05/2020   Bilateral hearing loss 12/07/2019   Sepsis (HCC) 11/28/2019   Pneumonia of right lung due to infectious organism    Acute kidney injury superimposed on CKD (HCC)    Sepsis due to gram-negative UTI (HCC) 11/27/2019   Volume depletion    Goals of care, counseling/discussion    Palliative care by specialist    Acute renal failure (HCC) 09/22/2019   History of CVA with residual deficit 09/22/2019   Cognitive deficit as late effect of cerebrovascular accident (CVA) 09/22/2019   Hyperkalemia 09/22/2019   Acute left-sided weakness 09/06/2019   SIRS (systemic inflammatory response syndrome) (HCC) 07/19/2017   Influenza A 07/19/2017   Diabetes (HCC) 07/19/2017   HTN (hypertension) 07/19/2017   Hypothyroidism 07/19/2017   HLD (hyperlipidemia) 07/19/2017   Aortic stenosis, mild 01/17/2016    Orientation RESPIRATION BLADDER Height & Weight     Self  Normal Incontinent Weight: 179 lb 3.7 oz (81.3 kg) Height:  6' (182.9 cm)  BEHAVIORAL SYMPTOMS/MOOD NEUROLOGICAL BOWEL NUTRITION STATUS      Continent Diet (heart healthy/carb modified, thin liquids)  AMBULATORY STATUS COMMUNICATION OF NEEDS  Skin   Limited Assist Verbally PU Stage and Appropriate Care PU Stage 1 Dressing:  (Coccyx,Non adherent; Foam - Lift dressing to assess site every shift)                     Personal Care Assistance Level of Assistance  Bathing, Feeding, Dressing Bathing Assistance: Limited assistance Feeding assistance: Independent Dressing Assistance: Limited assistance     Functional Limitations Info  Hearing, Speech, Sight Sight Info: Adequate Hearing Info: Adequate Speech Info: Adequate    SPECIAL CARE FACTORS FREQUENCY  PT (By licensed PT), OT (By licensed OT)     PT Frequency: 5x OT Frequency: 5x            Contractures Contractures Info: Not present    Additional Factors Info  Code Status, Allergies Code Status Info: DNR Allergies Info: Cephalexin           Current Medications (02/08/2021):  This is the current hospital active medication list Current Facility-Administered Medications  Medication Dose Route Frequency Provider Last Rate Last Admin   atorvastatin (LIPITOR) tablet 20 mg  20 mg Oral QHS Anwar, Shayan S, DO       glipiZIDE (GLUCOTROL XL) 24 hr tablet 2.5 mg  2.5 mg Oral Q breakfast Lynwood Dawley S, DO   2.5 mg at 02/08/21 0943   labetalol (NORMODYNE) injection 10 mg  10 mg Intravenous Q4H PRN Lynwood Dawley S, DO       levothyroxine (SYNTHROID) tablet 75 mcg  75 mcg Oral Q0600 Verdia Kuba, DO  75 mcg at 02/08/21 0556   lisinopril (ZESTRIL) tablet 2.5 mg  2.5 mg Oral Daily Lynwood Dawley S, DO   2.5 mg at 02/08/21 4825     Discharge Medications: Please see discharge summary for a list of discharge medications.  Relevant Imaging Results:  Relevant Lab Results:   Additional Information [id number]  Maree Krabbe, LCSW

## 2021-02-08 NOTE — Progress Notes (Signed)
[facility]) Hospital Liaison Note  This patient is currently enrolled in [facility] outpatient-based palliative care.  [facility] will continue to follow for discharge disposition.   Please call with any outpatient based palliative care related questions or concerns.   Thank you,   Bobbie "Einar Gip, RN, BSN [facility] Liaison [phone]

## 2021-02-08 NOTE — Evaluation (Signed)
Physical Therapy Evaluation Patient Details Name: Jerry Barrett MRN: [id number] DOB: Dec 31, 1932 Today's Date: 02/08/2021   History of Present Illness  Pt is an 87 y.o. male with a past medical history of non-insulin-dependent diabetes mellitus type 2, hypertension, hypothyroidism, history of CVA with some residual left-sided weakness, severely hard of hearing, and dementia, ambulatory dysfunction, chronic kidney disease stage IIIB. This patient presented to the emergency department via EMS after APS was called when the neighbor noticed the patient had fallen twice in the last couple days.  MD assessment includes: mechanical fall with small focal IPH and ambulatory dysfunction.   Clinical Impression  Pt required extensive encouragement and cuing to participate during the session.  Pt needed physical assistance with all functional tasks including +2 HHA along with min A for stability during ambulation.  Pt is at a very high risk for falls as well as further functional decline and will benefit from PT services in a SNF setting upon discharge to safely address deficits listed in patient problem list for decreased caregiver assistance and eventual return to PLOF.     Follow Up Recommendations SNF;Supervision/Assistance - 24 hour    Equipment Recommendations  None recommended by PT    Recommendations for Other Services       Precautions / Restrictions Precautions Precautions: Fall Restrictions Weight Bearing Restrictions: No      Mobility  Bed Mobility Overal bed mobility: Needs Assistance Bed Mobility: Supine to Sit     Supine to sit: Min assist Sit to supine: Min assist   General bed mobility comments: Mod cuing and min A for BLE and trunk control    Transfers Overall transfer level: Needs assistance Equipment used: 2 person hand held assist Transfers: Sit to/from Stand Sit to Stand: Min assist;Mod assist         General transfer comment: Mod encouragement and cues to  initiate transfer with min to mod A needed to come to full upright position  Ambulation/Gait Ambulation/Gait assistance: Min assist Gait Distance (Feet): 30 Feet Assistive device: 2 person hand held assist Gait Pattern/deviations: Step-through pattern;Decreased step length - right;Decreased step length - left Gait velocity: decreased   General Gait Details: Slow cadence with min A for stability  Stairs            Wheelchair Mobility    Modified Rankin (Stroke Patients Only)       Balance Overall balance assessment: Needs assistance Sitting-balance support: Feet supported;Bilateral upper extremity supported Sitting balance-Leahy Scale: Fair     Standing balance support: During functional activity;Bilateral upper extremity supported Standing balance-Leahy Scale: Poor Standing balance comment: Min A for stability during ambulation                             Pertinent Vitals/Pain Pain Assessment: Faces Faces Pain Scale: Hurts little more Pain Location: back with activity Pain Descriptors / Indicators: Grimacing Pain Intervention(s): Monitored during session    Home Living Family/patient expects to be discharged to:: Private residence Living Arrangements: Spouse/significant other Available Help at Discharge: Family;Available 24 hours/day Type of Home: House Home Access: Stairs to enter Entrance Stairs-Rails: Left Entrance Stairs-Number of Steps: 3 Home Layout: Two level;Able to live on main level with bedroom/bathroom Home Equipment: Bedside commode;Walker - 2 wheels Additional Comments: History obtained from chart review and spouse who was a questionable historian with some difficulty providing details    Prior Function Level of Independence: Needs assistance  Comments: Per patient's spouse pt able to ambulate HH distances without an AD, 2 falls in the last 6 months both more recent     Hand Dominance   Dominant Hand: Right     Extremity/Trunk Assessment   Upper Extremity Assessment Upper Extremity Assessment: Generalized weakness    Lower Extremity Assessment Lower Extremity Assessment: Generalized weakness       Communication   Communication: HOH  Cognition Arousal/Alertness: Awake/alert Behavior During Therapy: Restless Overall Cognitive Status: Difficult to assess                                 General Comments: Pt with cognitive impairment with spouse unable to provide detailed history      General Comments      Exercises     Assessment/Plan    PT Assessment Patient needs continued PT services  PT Problem List Decreased strength;Decreased activity tolerance;Decreased balance;Decreased mobility;Decreased knowledge of use of DME       PT Treatment Interventions Stair training;Functional mobility training;Therapeutic activities;DME instruction;Therapeutic exercise;Balance training;Gait training;Patient/family education    PT Goals (Current goals can be found in the Care Plan section)  Acute Rehab PT Goals Patient Stated Goal: "get these mitts off of me" PT Goal Formulation: Patient unable to participate in goal setting Time For Goal Achievement: 02/21/21 Potential to Achieve Goals: Fair    Frequency Min 2X/week   Barriers to discharge Inaccessible home environment;Decreased caregiver support      Co-evaluation               AM-PAC PT "6 Clicks" Mobility  Outcome Measure Help needed turning from your back to your side while in a flat bed without using bedrails?: A Little Help needed moving from lying on your back to sitting on the side of a flat bed without using bedrails?: A Little Help needed moving to and from a bed to a chair (including a wheelchair)?: A Little Help needed standing up from a chair using your arms (e.g., wheelchair or bedside chair)?: A Little Help needed to walk in hospital room?: A Little Help needed climbing 3-5 steps with a railing? : A  Lot 6 Click Score: 17    End of Session Equipment Utilized During Treatment: Gait belt Activity Tolerance: Patient tolerated treatment well Patient left: in bed;Other (comment) (Pt left with OT for OT evaluation) Nurse Communication: Mobility status PT Visit Diagnosis: Unsteadiness on feet (R26.81);History of falling (Z91.81);Difficulty in walking, not elsewhere classified (R26.2);Muscle weakness (generalized) (M62.81)    Time: 1040-1107 PT Time Calculation (min) (ACUTE ONLY): 27 min   Charges:   PT Evaluation $PT Eval Moderate Complexity: 1 Mod         D. Scott Armanie Martine PT, DPT 02/08/21, 2:08 PM

## 2021-02-08 NOTE — TOC Initial Note (Signed)
Transition of Care [name]    Patient Details  Name: Jerry Barrett MRN: [id number] Date of Birth: 1933/02/07  [name] CM/SW Contact:    Shelbie Hutching, RN Phone Number: 02/08/2021, 4:26 PM  Clinical Narrative:                 Patient is from home with his wife, Jerry Barrett.  Patient admitted to the hospital with intraparenchymal hematoma of the brain after a fall.  RNCM met with patient and the wife at the bedside.  Patient's wife would like for the patient to go to [facility] in [place].  Bed search started.  RNCM did inform patient's wife that patient will not discharge over the weekend- the earliest would likely be Tuesday due to the Holiday weekend and SNF's not accepting over the holiday.   Patient walks with a walker at home, wife drives, patient not able to drive since his stroke.     TOC will follow up with wife about bed offers when they become available.   Expected Discharge Plan: Skilled Nursing Facility Barriers to Discharge: Continued Medical Work up   Patient Goals and CMS Choice Patient states their goals for this hospitalization and ongoing recovery are:: Wife would like for patient to go to [facility] in [facility] CMS Medicare.gov Compare Post Acute Care list provided to:: Patient Represenative (must comment) Choice offered to / list presented to : Spouse  Expected Discharge Plan and Services Expected Discharge Plan: [place]   Discharge Planning Services: CM Consult Post Acute Care Choice: [place] Living arrangements for the past 2 months: Single Family Home                 DME Arranged: N/A DME Agency: NA       HH Arranged: NA          Prior Living Arrangements/Services Living arrangements for the past 2 months: Single Family Home Lives with:: Spouse Patient language and need for interpreter reviewed:: Yes Do you feel safe going back to the place where you live?: No   patient not safe to go  home- wife cannot take care of him  Need for Family Participation in Patient Care: Yes (Comment) Care giver support system in place?: Yes (comment) Current home services: DME (walker) Criminal Activity/Legal Involvement Pertinent to Current Situation/Hospitalization: No - Comment as needed  Activities of Daily Living Home Assistive Devices/Equipment: None ADL Screening (condition at time of admission) Patient's cognitive ability adequate to safely complete daily activities?: No Is the patient deaf or have difficulty hearing?: Yes Does the patient have difficulty seeing, even when wearing glasses/contacts?: Yes Does the patient have difficulty concentrating, remembering, or making decisions?: Yes Patient able to express need for assistance with ADLs?: Yes Does the patient have difficulty dressing or bathing?: Yes Independently performs ADLs?: No Does the patient have difficulty walking or climbing stairs?: Yes Weakness of Legs: Both Weakness of Arms/Hands: Both  Permission Sought/Granted Permission sought to share information with : Case Manager Permission granted to share information with : Yes, Verbal Permission Granted  Share Information with NAME: Jerry Barrett (wife)  Permission granted to share info w AGENCY: [facility] and SNF's  Permission granted to share info w Relationship: wife     Emotional Assessment Appearance:: Appears stated age   Affect (typically observed): Restless Orientation: : Oriented to Self Alcohol / Substance Use: Not Applicable Psych Involvement: No (comment)  Admission diagnosis:  Intraparenchymal hemorrhage of brain ([place]) [I61.9] Intraparenchymal  hematoma of brain, right, with loss of consciousness, initial encounter [facility]) [C38.377P] Patient Active Problem List   Diagnosis Date Noted   Intraparenchymal hemorrhage of brain ([place]) 02/07/2021   Dry skin dermatitis 07/05/2020   Bilateral hearing loss 12/07/2019   Sepsis ([place]) 11/28/2019   Pneumonia of right  lung due to infectious organism    Acute kidney injury superimposed on CKD ([place])    Sepsis due to gram-negative UTI ([place]) 11/27/2019   Volume depletion    Goals of care, counseling/discussion    Palliative care by specialist    Acute renal failure ([place]) 09/22/2019   History of CVA with residual deficit 09/22/2019   Cognitive deficit as late effect of cerebrovascular accident (CVA) 09/22/2019   Hyperkalemia 09/22/2019   Acute left-sided weakness 09/06/2019   SIRS (systemic inflammatory response syndrome) ([place]) 07/19/2017   Influenza A 07/19/2017   Diabetes ([place]) 07/19/2017   HTN (hypertension) 07/19/2017   Hypothyroidism 07/19/2017   HLD (hyperlipidemia) 07/19/2017   Aortic stenosis, mild 01/17/2016   PCP:  Sharyne Peach, MD Pharmacy:   [id number] [id number]: [phone]: [phone]    Social Determinants of Health (SDOH) Interventions    Readmission Risk Interventions Readmission Risk Prevention Plan 11/30/2019  Transportation Screening Complete  PCP or Specialist Appt within 3-5 Days Complete  HRI or Home Care Consult Complete  Palliative Care Screening Complete  Medication Review (RN Care Manager) Complete  Some recent data might be hidden

## 2021-02-08 NOTE — Progress Notes (Signed)
PROGRESS NOTE    Jerry Barrett  HAL:[id number] DOB: 1933/05/24 DOA: 02/07/2021 PCP: Rayetta Humphrey, MD    Chief Complaint  Patient presents with   Weakness    Brief Narrative:  Jerry Barrett is a 87 y.o. male with a past medical history of non-insulin-dependent diabetes mellitus type 2, hypertension, hypothyroidism, history of CVA with some residual left-sided weakness, severely hard of hearing, and dementia, ambulatory dysfunction, chronic kidney disease stage IIIB.   This patient presented to the emergency department via EMS after APS was called when the neighbor noticed the patient had fallen twice in the last couple days  Subjective:  He is demented and lethargic Wife at bedside   Assessment & Plan:   Active Problems:   Intraparenchymal hemorrhage of brain (HCC)   Right parietal traumatic hemorrhage -Neurosurgery consulted, repeat CT head no acute interval changes, no need for further cranial imaging if exam remains stable no indication for neurosurgical intervention at this time   Hypertension, noninsulin-dependent type 2 diabetes continue home medication  Hypothyroidism TSH elevated at 5 On increased dose Synthroid at 75 mcg daily, need repeat TSH in 6 weeks  Vascular dementia/failure to thrive He is followed by outpatient palliative care Not safe to discharge home Plan to discharge to skilled nursing facility with palliative care versus hospice depends on patient progress in the next few days  Nutritional Assessment: The patient's BMI is: Body mass index is 24.31 kg/m.Marland Kitchen    Skin Assessment:  I have examined the patient's skin and I agree with the wound assessment as performed by the wound care RN as outlined below:  Pressure Injury 02/07/21 Coccyx Circumferential Stage 1 -  Intact skin with non-blanchable redness of a localized area usually over a bony prominence. (Active)  02/07/21 2230  Location: Coccyx  Location Orientation: Circumferential   Staging: Stage 1 -  Intact skin with non-blanchable redness of a localized area usually over a bony prominence.  Wound Description (Comments):   Present on Admission: Yes    Unresulted Labs (From admission, onward)    None         DVT prophylaxis: SCDs Start: 02/07/21 2123   Code Status: DNR Family Communication: Wife at bedside Disposition:   Status is: Inpatient   Dispo: The patient is from: home              Anticipated d/c is to: SNF with palliative care vs hospice              Anticipated d/c date is: early next week              monitor oral intake  Consultants:  Neurosurgery Case discussed with [facility] liaison Case manager  Procedures:  none  Antimicrobials:    none    Objective: Vitals:   02/07/21 1900 02/07/21 2230 02/08/21 0634 02/08/21 0851  BP: (!) 159/114 (!) 147/74 127/63 135/65  Pulse:  83 77 79  Resp: 16 16 16 17   Temp:  98.2 F (36.8 C) 98 F (36.7 C) 98.1 F (36.7 C)  TempSrc:  Oral Oral Oral  SpO2:  100% 98% 99%  Weight:  81.3 kg    Height:        Intake/Output Summary (Last 24 hours) at 02/08/2021 1431 Last data filed at 02/08/2021 1022 Gross per 24 hour  Intake 1240 ml  Output 200 ml  Net 1040 ml   Filed Weights   02/07/21 1354 02/07/21 2230  Weight: 77.1 kg 81.3 kg  Examination:  General exam: Frail, appears confused, has mittens on Respiratory system:  Respiratory effort normal. Cardiovascular system: S1 & S2 heard, RRR. Marland Kitchen Gastrointestinal system: Abdomen is nondistended, soft and nontender. Normal bowel sounds heard. Central nervous system: Lethargic , oriented to self.  Left-sided weakness Extremities: No edema Skin: Sacral stage I pressure ulcer present on admission Psychiatry: Demented, no agitation currently     Data Reviewed: I have personally reviewed following labs and imaging studies  CBC: Recent Labs  Lab 02/07/21 1358  WBC 9.8  HGB 14.4  HCT 39.5  MCV 94.7  PLT 178    Basic Metabolic  Panel: Recent Labs  Lab 02/07/21 1358  NA 136  K 3.9  CL 104  CO2 25  GLUCOSE 139*  BUN 19  CREATININE 1.38*  CALCIUM 9.0  MG 2.1    GFR: Estimated Creatinine Clearance: 42.2 mL/min (A) (by C-G formula based on SCr of 1.38 mg/dL (H)).  Liver Function Tests: Recent Labs  Lab 02/07/21 1358  AST 20  ALT 17  ALKPHOS 80  BILITOT 1.3*  PROT 6.8  ALBUMIN 3.6    CBG: No results for input(s): GLUCAP in the last 168 hours.   Recent Results (from the past 240 hour(s))  Resp Panel by RT-PCR (Flu A&B, Covid) Nasopharyngeal Swab     Status: None   Collection Time: 02/07/21  4:55 PM   Specimen: Nasopharyngeal Swab; Nasopharyngeal(NP) swabs in vial transport medium  Result Value Ref Range Status   SARS Coronavirus 2 by RT PCR NEGATIVE NEGATIVE Final    Comment: (NOTE) SARS-CoV-2 target nucleic acids are NOT DETECTED.  The SARS-CoV-2 RNA is generally detectable in upper respiratory specimens during the acute phase of infection. The lowest concentration of SARS-CoV-2 viral copies this assay can detect is 138 copies/mL. A negative result does not preclude SARS-Cov-2 infection and should not be used as the sole basis for treatment or other patient management decisions. A negative result may occur with  improper specimen collection/handling, submission of specimen other than nasopharyngeal swab, presence of viral mutation(s) within the areas targeted by this assay, and inadequate number of viral copies(<138 copies/mL). A negative result must be combined with clinical observations, patient history, and epidemiological information. The expected result is Negative.  Fact Sheet for Patients:  BloggerCourse.com  Fact Sheet for Healthcare Providers:  SeriousBroker.it  This test is no t yet approved or cleared by the [place] FDA and  has been authorized for detection and/or diagnosis of SARS-CoV-2 by FDA under an Emergency Use  Authorization (EUA). This EUA will remain  in effect (meaning this test can be used) for the duration of the COVID-19 declaration under Section 564(b)(1) of the Act, 21 U.S.C.section 360bbb-3(b)(1), unless the authorization is terminated  or revoked sooner.       Influenza A by PCR NEGATIVE NEGATIVE Final   Influenza B by PCR NEGATIVE NEGATIVE Final    Comment: (NOTE) The Xpert Xpress SARS-CoV-2/FLU/RSV plus assay is intended as an aid in the diagnosis of influenza from Nasopharyngeal swab specimens and should not be used as a sole basis for treatment. Nasal washings and aspirates are unacceptable for Xpert Xpress SARS-CoV-2/FLU/RSV testing.  Fact Sheet for Patients: BloggerCourse.com  Fact Sheet for Healthcare Providers: SeriousBroker.it  This test is not yet approved or cleared by the [place] FDA and has been authorized for detection and/or diagnosis of SARS-CoV-2 by FDA under an Emergency Use Authorization (EUA). This EUA will remain in effect (meaning this test can be used)  for the duration of the COVID-19 declaration under Section 564(b)(1) of the Act, 21 U.S.C. section 360bbb-3(b)(1), unless the authorization is terminated or revoked.  Performed at [facility], [address] [place], [place] [id number]          Radiology Studies: CT HEAD WO CONTRAST (5MM)  Result Date: 02/08/2021 CLINICAL DATA:  Intraparenchymal hemorrhage follow-up EXAM: CT HEAD WITHOUT CONTRAST TECHNIQUE: Contiguous axial images were obtained from the base of the skull through the vertex without intravenous contrast. COMPARISON:  02/07/2021 FINDINGS: Brain: Small focus of cortical hemorrhage in the anterior right parietal lobe is unchanged. There is generalized volume loss and findings of chronic small vessel ischemia. Vascular: No abnormal hyperdensity of the major intracranial arteries or dural venous sinuses. No intracranial  atherosclerosis. Skull: The visualized skull base, calvarium and extracranial soft tissues are normal. Sinuses/Orbits: No fluid levels or advanced mucosal thickening of the visualized paranasal sinuses. No mastoid or middle ear effusion. The orbits are normal. IMPRESSION: Unchanged small focus of cortical hemorrhage in the anterior right parietal lobe. Electronically Signed   By: Deatra RobinsonKevin  Herman M.D.   On: 02/08/2021 00:38   CT HEAD WO CONTRAST (5MM)  Result Date: 02/07/2021 CLINICAL DATA:  Mental status change, unknown cause; Neck trauma (Age >= 65y) EXAM: CT HEAD WITHOUT CONTRAST CT CERVICAL SPINE WITHOUT CONTRAST TECHNIQUE: Multidetector CT imaging of the head and cervical spine was performed following the standard protocol without intravenous contrast. Multiplanar CT image reconstructions of the cervical spine were also generated. COMPARISON:  09/06/2020, 11/30/2019 FINDINGS: CT HEAD FINDINGS Brain: Small amount of hyperattenuating material within the peripheral aspect of the right parietal lobe (series 4, image 40), new from prior, suspicious for intraparenchymal hemorrhage. No mass effect or midline shift. No additional sites of intracranial hemorrhage. No extra-axial collection. No hydrocephalus. Scattered low-density changes within the periventricular and subcortical white matter compatible with chronic microvascular ischemic change. Mild diffuse cerebral volume loss. Vascular: Atherosclerotic calcifications involving the large vessels of the skull base. No unexpected hyperdense vessel. Skull: Normal. Negative for fracture or focal lesion. Sinuses/Orbits: Unchanged right frontal osteoma. Partial posterior left ethmoid air cell opacification. Otherwise clear. Other: Negative for scalp hematoma. CT CERVICAL SPINE FINDINGS Alignment: Facet joints are aligned without dislocation or traumatic listhesis. Dens and lateral masses are aligned. Unchanged facet mediated grade 1 anterolisthesis of C6 on C7. Skull base  and vertebrae: No acute fracture. No primary bone lesion or focal pathologic process. Soft tissues and spinal canal: No prevertebral fluid or swelling. No visible canal hematoma. Disc levels: Advanced facet-predominant multilevel cervical spondylosis, not appreciably progressed from the prior CT. Upper chest: Included lung apices are clear. Other: Bilateral carotid atherosclerosis. IMPRESSION: 1. Small focal intraparenchymal hemorrhage in the right parietal lobe. 2. No acute fracture or traumatic listhesis of the cervical spine. 3. Advanced multilevel cervical spondylosis. These results were called by telephone at the time of interpretation on 02/07/2021 at 6:08 pm to [facility] , who verbally acknowledged these results. Electronically Signed   By: Duanne GuessNicholas  Plundo D.O.   On: 02/07/2021 18:08   CT Cervical Spine Wo Contrast  Result Date: 02/07/2021 CLINICAL DATA:  Mental status change, unknown cause; Neck trauma (Age >= 65y) EXAM: CT HEAD WITHOUT CONTRAST CT CERVICAL SPINE WITHOUT CONTRAST TECHNIQUE: Multidetector CT imaging of the head and cervical spine was performed following the standard protocol without intravenous contrast. Multiplanar CT image reconstructions of the cervical spine were also generated. COMPARISON:  09/06/2020, 11/30/2019 FINDINGS: CT HEAD FINDINGS Brain: Small amount  of hyperattenuating material within the peripheral aspect of the right parietal lobe (series 4, image 40), new from prior, suspicious for intraparenchymal hemorrhage. No mass effect or midline shift. No additional sites of intracranial hemorrhage. No extra-axial collection. No hydrocephalus. Scattered low-density changes within the periventricular and subcortical white matter compatible with chronic microvascular ischemic change. Mild diffuse cerebral volume loss. Vascular: Atherosclerotic calcifications involving the large vessels of the skull base. No unexpected hyperdense vessel. Skull: Normal. Negative for  fracture or focal lesion. Sinuses/Orbits: Unchanged right frontal osteoma. Partial posterior left ethmoid air cell opacification. Otherwise clear. Other: Negative for scalp hematoma. CT CERVICAL SPINE FINDINGS Alignment: Facet joints are aligned without dislocation or traumatic listhesis. Dens and lateral masses are aligned. Unchanged facet mediated grade 1 anterolisthesis of C6 on C7. Skull base and vertebrae: No acute fracture. No primary bone lesion or focal pathologic process. Soft tissues and spinal canal: No prevertebral fluid or swelling. No visible canal hematoma. Disc levels: Advanced facet-predominant multilevel cervical spondylosis, not appreciably progressed from the prior CT. Upper chest: Included lung apices are clear. Other: Bilateral carotid atherosclerosis. IMPRESSION: 1. Small focal intraparenchymal hemorrhage in the right parietal lobe. 2. No acute fracture or traumatic listhesis of the cervical spine. 3. Advanced multilevel cervical spondylosis. These results were called by telephone at the time of interpretation on 02/07/2021 at 6:08 pm to provider [facility] , who verbally acknowledged these results. Electronically Signed   By: Duanne Guess D.O.   On: 02/07/2021 18:08   DG Chest Portable 1 View  Result Date: 02/07/2021 CLINICAL DATA:  Status post fall. EXAM: PORTABLE CHEST 1 VIEW COMPARISON:  November 27, 2019 FINDINGS: Low lung volumes are seen. Mild, chronic appearing increased lung markings are noted. There is no evidence of acute infiltrate, pleural effusion or pneumothorax. The heart size and mediastinal contours are within normal limits. Marked severity calcification of the aortic arch is noted. Degenerative changes seen throughout the thoracic spine. IMPRESSION: No active cardiopulmonary disease. Electronically Signed   By: Aram Candela M.D.   On: 02/07/2021 17:15        Scheduled Meds:  atorvastatin  20 mg Oral QHS   glipiZIDE  2.5 mg Oral Q breakfast   levothyroxine   75 mcg Oral Q0600   lisinopril  2.5 mg Oral Daily   Continuous Infusions:   LOS: 1 day   Time spent: 35 mins Greater than 50% of this time was spent in counseling, explanation of diagnosis, planning of further management, and coordination of care.   Voice Recognition Reubin [place] dictation system was used to create this note, attempts have been made to correct errors. Please contact the author with questions and/or clarifications.   Albertine Grates, MD PhD FACP [facility]  Available via Epic secure chat 7am-7pm for nonurgent issues Please page for urgent issues To page the attending provider between 7A-7P or the covering provider during after hours 7P-7A, please log into the web site [email or web address] and access using universal [place] password for that web site. If you do not have the password, please call the hospital operator.    02/08/2021, 2:31 PM

## 2021-02-09 LAB — GLUCOSE, CAPILLARY
Glucose-Capillary: 91 mg/dL (ref 70–99)
Glucose-Capillary: 112 mg/dL — ABNORMAL HIGH (ref 70–99)
Glucose-Capillary: 112 mg/dL — ABNORMAL HIGH (ref 70–99)

## 2021-02-09 MED ORDER — HYDRALAZINE HCL 10 MG PO TABS
10.0000 mg | ORAL_TABLET | Freq: Three times a day (TID) | ORAL | Status: DC
  Administered 2021-02-09 – 2021-02-12 (×9): 10 mg via ORAL
  Filled 2021-02-09 (×11): qty 1

## 2021-02-09 NOTE — Progress Notes (Signed)
PROGRESS NOTE    Jerry Barrett  BEM:[id number] DOB: 1932-07-27 DOA: 02/07/2021 PCP: Rayetta Humphrey, MD    Chief Complaint  Patient presents with   Weakness    Brief Narrative:  Jerry Barrett is a 87 y.o. male with a past medical history of non-insulin-dependent diabetes mellitus type 2, hypertension, hypothyroidism, history of CVA with some residual left-sided weakness, severely hard of hearing, and dementia, ambulatory dysfunction, chronic kidney disease stage IIIB.   This patient presented to the emergency department via EMS after APS was called when the neighbor noticed the patient had fallen twice in the last couple days  Subjective:  He is demented and lethargic, not open eyes, mumbles a few words at times   Assessment & Plan:   Active Problems:   Intraparenchymal hemorrhage of brain (HCC)   Pressure injury of skin   Right parietal traumatic hemorrhage -Neurosurgery consulted, repeat CT head no acute interval changes, no need for further cranial imaging if exam remains stable no indication for neurosurgical intervention at this time   Hypertension, noninsulin-dependent type 2 diabetes continue home medication Bp elevated , start hydralazine  Hypothyroidism TSH elevated at 5 On increased dose Synthroid at 75 mcg daily, need repeat TSH in 6 weeks  Vascular dementia/failure to thrive He is followed by outpatient palliative care Not safe to discharge home Plan to discharge to skilled nursing facility with palliative care versus hospice depends on patient progress in the next few days  Nutritional Assessment: The patient's BMI is: Body mass index is 24.31 kg/m.Marland Kitchen    Skin Assessment:  I have examined the patient's skin and I agree with the wound assessment as performed by the wound care RN as outlined below:  Pressure Injury 02/07/21 Coccyx Circumferential Stage 1 -  Intact skin with non-blanchable redness of a localized area usually over a bony prominence.  (Active)  02/07/21 2230  Location: Coccyx  Location Orientation: Circumferential  Staging: Stage 1 -  Intact skin with non-blanchable redness of a localized area usually over a bony prominence.  Wound Description (Comments):   Present on Admission: Yes    Unresulted Labs (From admission, onward)    None         DVT prophylaxis: SCDs Start: 02/07/21 2123   Code Status: DNR Family Communication: Wife at bedside Disposition:   Status is: Inpatient   Dispo: The patient is from: home              Anticipated d/c is to: SNF with palliative care vs hospice              Anticipated d/c date is: early next week              monitor oral intake  Consultants:  Neurosurgery Case discussed with [facility] liaison Case manager  Procedures:  none  Antimicrobials:    none    Objective: Vitals:   02/08/21 1717 02/08/21 2247 02/09/21 0554 02/09/21 0750  BP: 130/68 (!) 161/116 (!) 163/118 (!) 168/82  Pulse: 81 97 80 83  Resp: 15 18 18 16   Temp: 98 F (36.7 C) 97.8 F (36.6 C) 97.8 F (36.6 C) 97.7 F (36.5 C)  TempSrc: Oral Oral    SpO2: 98% 96% 98% 98%  Weight:      Height:        Intake/Output Summary (Last 24 hours) at 02/09/2021 1024 Last data filed at 02/09/2021 0730 Gross per 24 hour  Intake --  Output 1970 ml  Net -1970  ml   Filed Weights   02/07/21 1354 02/07/21 2230  Weight: 77.1 kg 81.3 kg    Examination:  General exam: Frail, appears confused, has mittens on Respiratory system:  Respiratory effort normal. Cardiovascular system: S1 & S2 heard, RRR.  + precordial murmur Gastrointestinal system: Abdomen is nondistended, soft and nontender. Normal bowel sounds heard. Central nervous system: Lethargic , oriented to self.  Left-sided weakness Extremities: No edema, chronic scaly skin bilateral lower legs Skin: Sacral stage I pressure ulcer present on admission Psychiatry: Demented, no agitation currently     Data Reviewed: I have personally  reviewed following labs and imaging studies  CBC: Recent Labs  Lab 02/07/21 1358  WBC 9.8  HGB 14.4  HCT 39.5  MCV 94.7  PLT 178    Basic Metabolic Panel: Recent Labs  Lab 02/07/21 1358  NA 136  K 3.9  CL 104  CO2 25  GLUCOSE 139*  BUN 19  CREATININE 1.38*  CALCIUM 9.0  MG 2.1    GFR: Estimated Creatinine Clearance: 42.2 mL/min (A) (by C-G formula based on SCr of 1.38 mg/dL (H)).  Liver Function Tests: Recent Labs  Lab 02/07/21 1358  AST 20  ALT 17  ALKPHOS 80  BILITOT 1.3*  PROT 6.8  ALBUMIN 3.6    CBG: Recent Labs  Lab 02/09/21 0806  GLUCAP 91     Recent Results (from the past 240 hour(s))  Resp Panel by RT-PCR (Flu A&B, Covid) Nasopharyngeal Swab     Status: None   Collection Time: 02/07/21  4:55 PM   Specimen: Nasopharyngeal Swab; Nasopharyngeal(NP) swabs in vial transport medium  Result Value Ref Range Status   SARS Coronavirus 2 by RT PCR NEGATIVE NEGATIVE Final    Comment: (NOTE) SARS-CoV-2 target nucleic acids are NOT DETECTED.  The SARS-CoV-2 RNA is generally detectable in upper respiratory specimens during the acute phase of infection. The lowest concentration of SARS-CoV-2 viral copies this assay can detect is 138 copies/mL. A negative result does not preclude SARS-Cov-2 infection and should not be used as the sole basis for treatment or other patient management decisions. A negative result may occur with  improper specimen collection/handling, submission of specimen other than nasopharyngeal swab, presence of viral mutation(s) within the areas targeted by this assay, and inadequate number of viral copies(<138 copies/mL). A negative result must be combined with clinical observations, patient history, and epidemiological information. The expected result is Negative.  Fact Sheet for Patients:  BloggerCourse.comhttps://www.fda.gov/media/152166/download  Fact Sheet for Healthcare Providers:  SeriousBroker.ithttps://www.fda.gov/media/152162/download  This test is  no t yet approved or cleared by the [place] FDA and  has been authorized for detection and/or diagnosis of SARS-CoV-2 by FDA under an Emergency Use Authorization (EUA). This EUA will remain  in effect (meaning this test can be used) for the duration of the COVID-19 declaration under Section 564(b)(1) of the Act, 21 U.S.C.section 360bbb-3(b)(1), unless the authorization is terminated  or revoked sooner.       Influenza A by PCR NEGATIVE NEGATIVE Final   Influenza B by PCR NEGATIVE NEGATIVE Final    Comment: (NOTE) The Xpert Xpress SARS-CoV-2/FLU/RSV plus assay is intended as an aid in the diagnosis of influenza from Nasopharyngeal swab specimens and should not be used as a sole basis for treatment. Nasal washings and aspirates are unacceptable for Xpert Xpress SARS-CoV-2/FLU/RSV testing.  Fact Sheet for Patients: BloggerCourse.comhttps://www.fda.gov/media/152166/download  Fact Sheet for Healthcare Providers: SeriousBroker.ithttps://www.fda.gov/media/152162/download  This test is not yet approved or cleared by the [place] FDA  and has been authorized for detection and/or diagnosis of SARS-CoV-2 by FDA under an Emergency Use Authorization (EUA). This EUA will remain in effect (meaning this test can be used) for the duration of the COVID-19 declaration under Section 564(b)(1) of the Act, 21 U.S.C. section 360bbb-3(b)(1), unless the authorization is terminated or revoked.  Performed at [facility], [address] [place], [place] [address]          Radiology Studies: CT HEAD WO CONTRAST ( )  Result Date: 02/08/2021 CLINICAL DATA:  Altered mental status EXAM: CT HEAD WITHOUT CONTRAST TECHNIQUE: Contiguous axial images were obtained from the base of the skull through the vertex without intravenous contrast. COMPARISON:  02/08/2021 FINDINGS: Brain: Small area of cortical hemorrhage again seen in the right parietal lesion measuring up to 5 mm in largest diameter, unchanged since prior  study. There is atrophy and chronic small vessel disease changes. No new hemorrhage or acute infarct. No hydrocephalus. Vascular: No hyperdense vessel or unexpected calcification. Skull: No acute calvarial abnormality. Sinuses/Orbits: Visualized paranasal sinuses and mastoids clear. Orbital soft tissues unremarkable. Other: None IMPRESSION: Stable small cortical hemorrhage in the right parietal lobe. No new areas of hemorrhage. Atrophy, chronic small vessel disease. Electronically Signed   By: Charlett Nose M.D.   On: 02/08/2021 23:53   CT HEAD WO CONTRAST ( )  Result Date: 02/08/2021 CLINICAL DATA:  Intraparenchymal hemorrhage follow-up EXAM: CT HEAD WITHOUT CONTRAST TECHNIQUE: Contiguous axial images were obtained from the base of the skull through the vertex without intravenous contrast. COMPARISON:  02/07/2021 FINDINGS: Brain: Small focus of cortical hemorrhage in the anterior right parietal lobe is unchanged. There is generalized volume loss and findings of chronic small vessel ischemia. Vascular: No abnormal hyperdensity of the major intracranial arteries or dural venous sinuses. No intracranial atherosclerosis. Skull: The visualized skull base, calvarium and extracranial soft tissues are normal. Sinuses/Orbits: No fluid levels or advanced mucosal thickening of the visualized paranasal sinuses. No mastoid or middle ear effusion. The orbits are normal. IMPRESSION: Unchanged small focus of cortical hemorrhage in the anterior right parietal lobe. Electronically Signed   By: Deatra Robinson M.D.   On: 02/08/2021 00:38   CT HEAD WO CONTRAST ( )  Result Date: 02/07/2021 CLINICAL DATA:  Mental status change, unknown cause; Neck trauma (Age >= 65y) EXAM: CT HEAD WITHOUT CONTRAST CT CERVICAL SPINE WITHOUT CONTRAST TECHNIQUE: Multidetector CT imaging of the head and cervical spine was performed following the standard protocol without intravenous contrast. Multiplanar CT image reconstructions of the cervical spine  were also generated. COMPARISON:  09/06/2020, 11/30/2019 FINDINGS: CT HEAD FINDINGS Brain: Small amount of hyperattenuating material within the peripheral aspect of the right parietal lobe (series 4, image 40), new from prior, suspicious for intraparenchymal hemorrhage. No mass effect or midline shift. No additional sites of intracranial hemorrhage. No extra-axial collection. No hydrocephalus. Scattered low-density changes within the periventricular and subcortical white matter compatible with chronic microvascular ischemic change. Mild diffuse cerebral volume loss. Vascular: Atherosclerotic calcifications involving the large vessels of the skull base. No unexpected hyperdense vessel. Skull: Normal. Negative for fracture or focal lesion. Sinuses/Orbits: Unchanged right frontal osteoma. Partial posterior left ethmoid air cell opacification. Otherwise clear. Other: Negative for scalp hematoma. CT CERVICAL SPINE FINDINGS Alignment: Facet joints are aligned without dislocation or traumatic listhesis. Dens and lateral masses are aligned. Unchanged facet mediated grade 1 anterolisthesis of C6 on C7. Skull base and vertebrae: No acute fracture. No primary bone lesion or focal pathologic process. Soft tissues and spinal canal: No  prevertebral fluid or swelling. No visible canal hematoma. Disc levels: Advanced facet-predominant multilevel cervical spondylosis, not appreciably progressed from the prior CT. Upper chest: Included lung apices are clear. Other: Bilateral carotid atherosclerosis. IMPRESSION: 1. Small focal intraparenchymal hemorrhage in the right parietal lobe. 2. No acute fracture or traumatic listhesis of the cervical spine. 3. Advanced multilevel cervical spondylosis. These results were called by telephone at the time of interpretation on 02/07/2021 at 6:08 pm to provider [facility] , who verbally acknowledged these results. Electronically Signed   By: Duanne Guess D.O.   On: 02/07/2021 18:08   CT  Cervical Spine Wo Contrast  Result Date: 02/07/2021 CLINICAL DATA:  Mental status change, unknown cause; Neck trauma (Age >= 65y) EXAM: CT HEAD WITHOUT CONTRAST CT CERVICAL SPINE WITHOUT CONTRAST TECHNIQUE: Multidetector CT imaging of the head and cervical spine was performed following the standard protocol without intravenous contrast. Multiplanar CT image reconstructions of the cervical spine were also generated. COMPARISON:  09/06/2020, 11/30/2019 FINDINGS: CT HEAD FINDINGS Brain: Small amount of hyperattenuating material within the peripheral aspect of the right parietal lobe (series 4, image 40), new from prior, suspicious for intraparenchymal hemorrhage. No mass effect or midline shift. No additional sites of intracranial hemorrhage. No extra-axial collection. No hydrocephalus. Scattered low-density changes within the periventricular and subcortical white matter compatible with chronic microvascular ischemic change. Mild diffuse cerebral volume loss. Vascular: Atherosclerotic calcifications involving the large vessels of the skull base. No unexpected hyperdense vessel. Skull: Normal. Negative for fracture or focal lesion. Sinuses/Orbits: Unchanged right frontal osteoma. Partial posterior left ethmoid air cell opacification. Otherwise clear. Other: Negative for scalp hematoma. CT CERVICAL SPINE FINDINGS Alignment: Facet joints are aligned without dislocation or traumatic listhesis. Dens and lateral masses are aligned. Unchanged facet mediated grade 1 anterolisthesis of C6 on C7. Skull base and vertebrae: No acute fracture. No primary bone lesion or focal pathologic process. Soft tissues and spinal canal: No prevertebral fluid or swelling. No visible canal hematoma. Disc levels: Advanced facet-predominant multilevel cervical spondylosis, not appreciably progressed from the prior CT. Upper chest: Included lung apices are clear. Other: Bilateral carotid atherosclerosis. IMPRESSION: 1. Small focal intraparenchymal  hemorrhage in the right parietal lobe. 2. No acute fracture or traumatic listhesis of the cervical spine. 3. Advanced multilevel cervical spondylosis. These results were called by telephone at the time of interpretation on 02/07/2021 at 6:08 pm to provider [facility] , who verbally acknowledged these results. Electronically Signed   By: Duanne Guess D.O.   On: 02/07/2021 18:08   DG Chest Portable 1 View  Result Date: 02/07/2021 CLINICAL DATA:  Status post fall. EXAM: PORTABLE CHEST 1 VIEW COMPARISON:  November 27, 2019 FINDINGS: Low lung volumes are seen. Mild, chronic appearing increased lung markings are noted. There is no evidence of acute infiltrate, pleural effusion or pneumothorax. The heart size and mediastinal contours are within normal limits. Marked severity calcification of the aortic arch is noted. Degenerative changes seen throughout the thoracic spine. IMPRESSION: No active cardiopulmonary disease. Electronically Signed   By: Aram Candela M.D.   On: 02/07/2021 17:15        Scheduled Meds:  atorvastatin  20 mg Oral QHS   glipiZIDE  2.5 mg Oral Q breakfast   levothyroxine  75 mcg Oral Q0600   lisinopril  2.5 mg Oral Daily   Continuous Infusions:   LOS: 2 days   Time spent: 25 mins Greater than 50% of this time was spent in counseling, explanation of diagnosis, planning of further  management, and coordination of care.   Voice Recognition Reubin [place] dictation system was used to create this note, attempts have been made to correct errors. Please contact the author with questions and/or clarifications.   Albertine Grates, MD PhD FACP [facility]  Available via Epic secure chat 7am-7pm for nonurgent issues Please page for urgent issues To page the attending provider between 7A-7P or the covering provider during after hours 7P-7A, please log into the web site [email or web address] and access using universal Daisy password for that web site. If you do not have the password, please  call the hospital operator.    02/09/2021, 10:24 AM

## 2021-02-10 LAB — GLUCOSE, CAPILLARY
Glucose-Capillary: 133 mg/dL — ABNORMAL HIGH (ref 70–99)
Glucose-Capillary: 147 mg/dL — ABNORMAL HIGH (ref 70–99)

## 2021-02-10 NOTE — Progress Notes (Signed)
PROGRESS NOTE    Jerry Barrett  ZOX:[id number] DOB: 1933-02-21 DOA: 02/07/2021 PCP: Rayetta Humphrey, MD    Chief Complaint  Patient presents with   Weakness    Brief Narrative:  Jerry Barrett is a 87 y.o. male with a past medical history of non-insulin-dependent diabetes mellitus type 2, hypertension, hypothyroidism, history of CVA with some residual left-sided weakness, severely hard of hearing, and dementia, ambulatory dysfunction, chronic kidney disease stage IIIB.   This patient presented to the emergency department via EMS after APS was called when the neighbor noticed the patient had fallen twice in the last couple days  Subjective:  No acute interval changes He is demented and lethargic, not open eyes, mumbles a few words at times   Assessment & Plan:   Active Problems:   Intraparenchymal hemorrhage of brain (HCC)   Pressure injury of skin   Right parietal traumatic hemorrhage -Neurosurgery consulted, repeat CT head no acute interval changes, no need for further cranial imaging if exam remains stable no indication for neurosurgical intervention at this time   Hypertension, noninsulin-dependent type 2 diabetes continue home medication Bp elevated , start hydralazine  Hypothyroidism TSH elevated at 5 On increased dose Synthroid at 75 mcg daily, need repeat TSH in 6 weeks  Vascular dementia/failure to thrive He is followed by outpatient palliative care Not safe to discharge home Plan to discharge to skilled nursing facility with palliative care versus hospice depends on patient progress in the next few days  Nutritional Assessment: The patient's BMI is: Body mass index is 24.31 kg/m.Marland Kitchen    Skin Assessment:  I have examined the patient's skin and I agree with the wound assessment as performed by the wound care RN as outlined below:  Pressure Injury 02/07/21 Coccyx Circumferential Stage 1 -  Intact skin with non-blanchable redness of a localized area usually  over a bony prominence. (Active)  02/07/21 2230  Location: Coccyx  Location Orientation: Circumferential  Staging: Stage 1 -  Intact skin with non-blanchable redness of a localized area usually over a bony prominence.  Wound Description (Comments):   Present on Admission: Yes    Unresulted Labs (From admission, onward)    None         DVT prophylaxis: SCDs Start: 02/07/21 2123   Code Status: DNR Family Communication: Wife at bedside on 9/2 Disposition:   Status is: Inpatient   Dispo: The patient is from: home              Anticipated d/c is to: SNF with palliative care vs hospice              Anticipated d/c date is: early next week              monitor oral intake  Consultants:  Neurosurgery Case discussed with [facility] liaison Case manager  Procedures:  none  Antimicrobials:    none    Objective: Vitals:   02/09/21 2145 02/10/21 0456 02/10/21 0718 02/10/21 1145  BP: 132/79 (!) 164/96 (!) 107/59 130/77  Pulse: 99 90 93 90  Resp: 16 18 16 18   Temp:  98.2 F (36.8 C) 98.4 F (36.9 C) 97.8 F (36.6 C)  TempSrc:  Oral    SpO2: 96% 97% 95% 98%  Weight:      Height:        Intake/Output Summary (Last 24 hours) at 02/10/2021 1152 Last data filed at 02/10/2021 0456 Gross per 24 hour  Intake --  Output 450 ml  Net -450 ml   Filed Weights   02/07/21 1354 02/07/21 2230  Weight: 77.1 kg 81.3 kg    Examination:  General exam: Frail, appears confused, has mittens on Respiratory system:  Respiratory effort normal. Cardiovascular system: S1 & S2 heard, RRR.  + precordial murmur Gastrointestinal system: Abdomen is nondistended, soft and nontender. Normal bowel sounds heard. Central nervous system: Lethargic , oriented to self.  Left-sided weakness Extremities: No edema, chronic scaly skin bilateral lower legs Skin: Sacral stage I pressure ulcer present on admission Psychiatry: Demented, no agitation currently     Data Reviewed: I have personally  reviewed following labs and imaging studies  CBC: Recent Labs  Lab 02/07/21 1358  WBC 9.8  HGB 14.4  HCT 39.5  MCV 94.7  PLT 178    Basic Metabolic Panel: Recent Labs  Lab 02/07/21 1358  NA 136  K 3.9  CL 104  CO2 25  GLUCOSE 139*  BUN 19  CREATININE 1.38*  CALCIUM 9.0  MG 2.1    GFR: Estimated Creatinine Clearance: 42.2 mL/min (A) (by C-G formula based on SCr of 1.38 mg/dL (H)).  Liver Function Tests: Recent Labs  Lab 02/07/21 1358  AST 20  ALT 17  ALKPHOS 80  BILITOT 1.3*  PROT 6.8  ALBUMIN 3.6    CBG: Recent Labs  Lab 02/09/21 0806 02/09/21 1227 02/09/21 1653 02/10/21 0835 02/10/21 1147  GLUCAP 91 112* 112* 133* 147*     Recent Results (from the past 240 hour(s))  Resp Panel by RT-PCR (Flu A&B, Covid) Nasopharyngeal Swab     Status: None   Collection Time: 02/07/21  4:55 PM   Specimen: Nasopharyngeal Swab; Nasopharyngeal(NP) swabs in vial transport medium  Result Value Ref Range Status   SARS Coronavirus 2 by RT PCR NEGATIVE NEGATIVE Final    Comment: (NOTE) SARS-CoV-2 target nucleic acids are NOT DETECTED.  The SARS-CoV-2 RNA is generally detectable in upper respiratory specimens during the acute phase of infection. The lowest concentration of SARS-CoV-2 viral copies this assay can detect is 138 copies/mL. A negative result does not preclude SARS-Cov-2 infection and should not be used as the sole basis for treatment or other patient management decisions. A negative result may occur with  improper specimen collection/handling, submission of specimen other than nasopharyngeal swab, presence of viral mutation(s) within the areas targeted by this assay, and inadequate number of viral copies(<138 copies/mL). A negative result must be combined with clinical observations, patient history, and epidemiological information. The expected result is Negative.  Fact Sheet for Patients:  BloggerCourse.com  Fact Sheet for  Healthcare Providers:  SeriousBroker.it  This test is no t yet approved or cleared by the [place] FDA and  has been authorized for detection and/or diagnosis of SARS-CoV-2 by FDA under an Emergency Use Authorization (EUA). This EUA will remain  in effect (meaning this test can be used) for the duration of the COVID-19 declaration under Section 564(b)(1) of the Act, 21 U.S.C.section 360bbb-3(b)(1), unless the authorization is terminated  or revoked sooner.       Influenza A by PCR NEGATIVE NEGATIVE Final   Influenza B by PCR NEGATIVE NEGATIVE Final    Comment: (NOTE) The Xpert Xpress SARS-CoV-2/FLU/RSV plus assay is intended as an aid in the diagnosis of influenza from Nasopharyngeal swab specimens and should not be used as a sole basis for treatment. Nasal washings and aspirates are unacceptable for Xpert Xpress SARS-CoV-2/FLU/RSV testing.  Fact Sheet for Patients: BloggerCourse.com  Fact Sheet for Healthcare Providers: SeriousBroker.it  This test is not yet approved or cleared by the [place] and has been authorized for detection and/or diagnosis of SARS-CoV-2 by FDA under an Emergency Use Authorization (EUA). This EUA will remain in effect (meaning this test can be used) for the duration of the COVID-19 declaration under Section 564(b)(1) of the Act, 21 U.S.C. section 360bbb-3(b)(1), unless the authorization is terminated or revoked.  Performed at [facility], [address], [place], [place] [address]          Radiology Studies: CT HEAD WO CONTRAST ( )  Result Date: 02/08/2021 CLINICAL DATA:  Altered mental status EXAM: CT HEAD WITHOUT CONTRAST TECHNIQUE: Contiguous axial images were obtained from the base of the skull through the vertex without intravenous contrast. COMPARISON:  02/08/2021 FINDINGS: Brain: Small area of cortical hemorrhage again seen in the right  parietal lesion measuring up to 5 mm in largest diameter, unchanged since prior study. There is atrophy and chronic small vessel disease changes. No new hemorrhage or acute infarct. No hydrocephalus. Vascular: No hyperdense vessel or unexpected calcification. Skull: No acute calvarial abnormality. Sinuses/Orbits: Visualized paranasal sinuses and mastoids clear. Orbital soft tissues unremarkable. Other: None IMPRESSION: Stable small cortical hemorrhage in the right parietal lobe. No new areas of hemorrhage. Atrophy, chronic small vessel disease. Electronically Signed   By: Charlett Nose M.D.   On: 02/08/2021 23:53        Scheduled Meds:  atorvastatin  20 mg Oral QHS   glipiZIDE  2.5 mg Oral Q breakfast   hydrALAZINE  10 mg Oral Q8H   levothyroxine  75 mcg Oral Q0600   lisinopril  2.5 mg Oral Daily   Continuous Infusions:   LOS: 3 days   Time spent: 15 mins Greater than 50% of this time was spent in counseling, explanation of diagnosis, planning of further management, and coordination of care.   Voice Recognition Reubin Milan dictation system was used to create this note, attempts have been made to correct errors. Please contact the author with questions and/or clarifications.   Albertine Grates, MD PhD FACP [facility]  Available via Epic secure chat 7am-7pm for nonurgent issues Please page for urgent issues To page the attending provider between 7A-7P or the covering provider during after hours 7P-7A, please log into the web site [email or web address] and access using universal [place] password for that web site. If you do not have the password, please call the hospital operator.    02/10/2021, 11:52 AM

## 2021-02-10 NOTE — Plan of Care (Signed)

## 2021-02-11 LAB — RESP PANEL BY RT-PCR (FLU A&B, COVID) ARPGX2
Influenza A by PCR: NEGATIVE
Influenza B by PCR: NEGATIVE
SARS Coronavirus 2 by RT PCR: NEGATIVE

## 2021-02-11 LAB — GLUCOSE, CAPILLARY
Glucose-Capillary: 127 mg/dL — ABNORMAL HIGH (ref 70–99)
Glucose-Capillary: 124 mg/dL — ABNORMAL HIGH (ref 70–99)

## 2021-02-11 NOTE — TOC Progression Note (Signed)
Transition of Care [facility]) - Progression Note    Patient Details  Name: Jerry Barrett MRN: [id number] Date of Birth: 1933/04/11  [facility] CM/SW Contact  Allayne Butcher, RN Phone Number: 02/11/2021, 12:42 PM  Clinical Narrative:    Only bed offer so far is from [facility].  [facility] is not accepting admissions today but will accept tomorrow.  [facility] is family first choice, they are also closed today but RNCM will reach out to them tomorrow to see if they can offer.    Expected Discharge Plan: Skilled Nursing Facility Barriers to Discharge: Continued Medical Work up  Expected Discharge Plan and Services Expected Discharge Plan: Skilled Nursing Facility   Discharge Planning Services: CM Consult Post Acute Care Choice: Skilled Nursing Facility Living arrangements for the past 2 months: Single Family Home                 DME Arranged: N/A DME Agency: NA       HH Arranged: NA           Social Determinants of Health (SDOH) Interventions    Readmission Risk Interventions Readmission Risk Prevention Plan 11/30/2019  Transportation Screening Complete  PCP or Specialist Appt within 3-5 Days Complete  HRI or Home Care Consult Complete  Palliative Care Screening Complete  Medication Review (RN Care Manager) Complete  Some recent data might be hidden

## 2021-02-11 NOTE — Progress Notes (Signed)
PROGRESS NOTE    Jerry Barrett  HAL:[id number] DOB: 10/28/1932 DOA: 02/07/2021 PCP: Rayetta Humphrey, MD   Chief Complain: Weakness  Brief Narrative: Patient is a 87 year old male with history of diabetes type 2, hypertension, hypothyroidism, history of CVA with residual left-sided weakness, hard of hearing, dementia, ambulatory dysfunction, CKD stage IIIb who presented here after he was found to be falling at home.  On presentation, CT head showed Small focal intraparenchymal hemorrhage in the right parietal lobe.  Neurosurgery was consulted with no plan for intervention.  Repeat CT head stable.  Plan for skilled  facility placement.  Medically stable for discharge  Assessment & Plan:   Active Problems:   Intraparenchymal hemorrhage of brain (HCC)   Pressure injury of skin   Traumatic intracranial hemorrhage: Presented with fall. CT head showed Small focal intraparenchymal hemorrhage in the right parietal lobe.  Neurosurgery was consulted with no plan for intervention.  Repeat CT head stable  Hypertension: Monitor blood pressure.  On hydralazine.  Avoid hypertension  Diabetes type 2: Continue current regimen.  Monitor blood pressure  Hypothyroidism: TSH of 5.  On Synthroid 75 mcg daily.  Repeat TSH in 6 weeks.  Vascular dementia/failure to thrive: Follows with outpatient palliative care.  Plan for discharge to skilled nursing facility with Parative care follow-up versus hospice.  Patient has poor oral intake.  Consulted nutrition services.  History of CKD stage IIIb: Currently kidney function at baseline  Debility/deconditioning/frequent fall: Living at home.  Now the plan is for placement in a skilled nursing facility.  TOC following.  Pressure Injury 02/07/21 Coccyx Circumferential Stage 1 -  Intact skin with non-blanchable redness of a localized area usually over a bony prominence. (Active)  02/07/21 2230  Location: Coccyx  Location Orientation: Circumferential  Staging: Stage  1 -  Intact skin with non-blanchable redness of a localized area usually over a bony prominence.  Wound Description (Comments):   Present on Admission: Yes                DVT prophylaxis:SCD Code Status: DNR Family Communication: None at bedside Status is: Inpatient  Remains inpatient appropriate because:Unsafe d/c plan  Dispo: The patient is from: Home              Anticipated d/c is to: SNF              Patient currently is not medically stable to d/c.   Difficult to place patient No    Consultants: Neurosurgery  Procedures: None  Antimicrobials:  Anti-infectives (From admission, onward)    None       Subjective: Patient seen and examined at the bedside this morning.  Lying on bed.  Hemodynamically stable.  Looks comfortable.  Denies any complaints  Objective: Vitals:   02/10/21 1747 02/10/21 2059 02/11/21 0433 02/11/21 0833  BP: (!) 147/86 135/62 (!) 148/86 (!) 153/92  Pulse: 88 86 89 90  Resp: 16  16 15   Temp: 97.9 F (36.6 C) 98.6 F (37 C) 97.9 F (36.6 C) 97.8 F (36.6 C)  TempSrc:      SpO2: 98% 99%  97%  Weight:      Height:        Intake/Output Summary (Last 24 hours) at 02/11/2021 1129 Last data filed at 02/10/2021 1801 Gross per 24 hour  Intake --  Output 150 ml  Net -150 ml   Filed Weights   02/07/21 1354 02/07/21 2230  Weight: 77.1 kg 81.3 kg    Examination:  General exam: Overall comfortable, not in distress HEENT: PERRL Respiratory system:  no wheezes or crackles  Cardiovascular system: S1 & S2 heard, RRR.  Gastrointestinal system: Abdomen is nondistended, soft and nontender. Central nervous system: Alert and  awake but not oriented Extremities: No edema, no clubbing ,no cyanosis Skin:  no ulcers,no icterus  , scaling on lower extremities    Data Reviewed: I have personally reviewed following labs and imaging studies  CBC: Recent Labs  Lab 02/07/21 1358  WBC 9.8  HGB 14.4  HCT 39.5  MCV 94.7  PLT 178   Basic  Metabolic Panel: Recent Labs  Lab 02/07/21 1358  NA 136  K 3.9  CL 104  CO2 25  GLUCOSE 139*  BUN 19  CREATININE 1.38*  CALCIUM 9.0  MG 2.1   GFR: Estimated Creatinine Clearance: 42.2 mL/min (A) (by C-G formula based on SCr of 1.38 mg/dL (H)). Liver Function Tests: Recent Labs  Lab 02/07/21 1358  AST 20  ALT 17  ALKPHOS 80  BILITOT 1.3*  PROT 6.8  ALBUMIN 3.6   No results for input(s): LIPASE, AMYLASE in the last 168 hours. No results for input(s): AMMONIA in the last 168 hours. Coagulation Profile: Recent Labs  Lab 02/07/21 1826  INR 1.0   Cardiac Enzymes: No results for input(s): CKTOTAL, CKMB, CKMBINDEX, TROPONINI in the last 168 hours. BNP (last 3 results) No results for input(s): PROBNP in the last 8760 hours. HbA1C: No results for input(s): HGBA1C in the last 72 hours. CBG: Recent Labs  Lab 02/09/21 1227 02/09/21 1653 02/10/21 0835 02/10/21 1147 02/11/21 0829  GLUCAP 112* 112* 133* 147* 127*   Lipid Profile: No results for input(s): CHOL, HDL, LDLCALC, TRIG, CHOLHDL, LDLDIRECT in the last 72 hours. Thyroid Function Tests: No results for input(s): TSH, T4TOTAL, FREET4, T3FREE, THYROIDAB in the last 72 hours. Anemia Panel: No results for input(s): VITAMINB12, FOLATE, FERRITIN, TIBC, IRON, RETICCTPCT in the last 72 hours. Sepsis Labs: No results for input(s): PROCALCITON, LATICACIDVEN in the last 168 hours.  Recent Results (from the past 240 hour(s))  Resp Panel by RT-PCR (Flu A&B, Covid) Nasopharyngeal Swab     Status: None   Collection Time: 02/07/21  4:55 PM   Specimen: Nasopharyngeal Swab; Nasopharyngeal(NP) swabs in vial transport medium  Result Value Ref Range Status   SARS Coronavirus 2 by RT PCR NEGATIVE NEGATIVE Final    Comment: (NOTE) SARS-CoV-2 target nucleic acids are NOT DETECTED.  The SARS-CoV-2 RNA is generally detectable in upper respiratory specimens during the acute phase of infection. The lowest concentration of SARS-CoV-2  viral copies this assay can detect is 138 copies/mL. A negative result does not preclude SARS-Cov-2 infection and should not be used as the sole basis for treatment or other patient management decisions. A negative result may occur with  improper specimen collection/handling, submission of specimen other than nasopharyngeal swab, presence of viral mutation(s) within the areas targeted by this assay, and inadequate number of viral copies(<138 copies/mL). A negative result must be combined with clinical observations, patient history, and epidemiological information. The expected result is Negative.  Fact Sheet for Patients:  BloggerCourse.com  Fact Sheet for Healthcare Providers:  SeriousBroker.it  This test is no t yet approved or cleared by the [place] FDA and  has been authorized for detection and/or diagnosis of SARS-CoV-2 by FDA under an Emergency Use Authorization (EUA). This EUA will remain  in effect (meaning this test can be used) for the duration of the COVID-19 declaration under Section  564(b)(1) of the Act, 21 U.S.C.section 360bbb-3(b)(1), unless the authorization is terminated  or revoked sooner.       Influenza A by PCR NEGATIVE NEGATIVE Final   Influenza B by PCR NEGATIVE NEGATIVE Final    Comment: (NOTE) The Xpert Xpress SARS-CoV-2/FLU/RSV plus assay is intended as an aid in the diagnosis of influenza from Nasopharyngeal swab specimens and should not be used as a sole basis for treatment. Nasal washings and aspirates are unacceptable for Xpert Xpress SARS-CoV-2/FLU/RSV testing.  Fact Sheet for Patients: BloggerCourse.com  Fact Sheet for Healthcare Providers: SeriousBroker.it  This test is not yet approved or cleared by the [place] FDA and has been authorized for detection and/or diagnosis of SARS-CoV-2 by FDA under an Emergency Use Authorization  (EUA). This EUA will remain in effect (meaning this test can be used) for the duration of the COVID-19 declaration under Section 564(b)(1) of the Act, 21 U.S.C. section 360bbb-3(b)(1), unless the authorization is terminated or revoked.  Performed at [facility], [address] [place], [place] [address]          Radiology Studies: No results found.      Scheduled Meds:  atorvastatin  20 mg Oral QHS   glipiZIDE  2.5 mg Oral Q breakfast   hydrALAZINE  10 mg Oral Q8H   levothyroxine  75 mcg Oral Q0600   lisinopril  2.5 mg Oral Daily   Continuous Infusions:   LOS: 4 days    Time spent:25 mins, More than 50% of that time was spent in counseling and/or coordination of care.      Burnadette Pop, MD [facility] P9/10/2020, 11:29 AM

## 2021-02-12 LAB — GLUCOSE, CAPILLARY: Glucose-Capillary: 131 mg/dL — ABNORMAL HIGH (ref 70–99)

## 2021-02-12 MED ORDER — HYDRALAZINE HCL 10 MG PO TABS: 10.0000 mg | ORAL_TABLET | Freq: Three times a day (TID) | ORAL | Status: AC

## 2021-02-12 MED ORDER — LEVOTHYROXINE SODIUM 75 MCG PO TABS: 75.0000 ug | ORAL_TABLET | Freq: Every day | ORAL | Status: AC

## 2021-02-12 NOTE — Care Management Important Message (Signed)
Important Message  Patient Details  Name: Jerry Barrett MRN: [id number] Date of Birth: August 26, 1932   Medicare Important Message Given:  Yes  RNCM was on the phone with the wife and she reviewed the Important Message from Medicare with her. She is in agreement with the discharge plan and she let her know she would give a copy to her husband.   Olegario Messier A Maeson Purohit 02/12/2021, 12:46 PM

## 2021-02-12 NOTE — Progress Notes (Signed)
Physical Therapy Treatment Patient Details Name: Jerry Barrett MRN: [id number] DOB: 07/09/1932 Today's Date: 02/12/2021    History of Present Illness Pt is an 87 y.o. male with a past medical history of non-insulin-dependent diabetes mellitus type 2, hypertension, hypothyroidism, history of CVA with some residual left-sided weakness, severely hard of hearing, and dementia, ambulatory dysfunction, chronic kidney disease stage IIIB. This patient presented to the emergency department via EMS after APS was called when the neighbor noticed the patient had fallen twice in the last couple days.  MD assessment includes: mechanical fall with small focal IPH and ambulatory dysfunction.    PT Comments    Pt was pleasant and motivated to participate during the session and although he was somewhat impulsive he was able to be easily redirected.  Pt required extra time and cuing to follow most commands but overall was grossly improved compared to prior session and put forth good effort during the session.  Pt required physical assistance at times during the session to perform functional tasks and to prevent LOB and is at a high risk for falls.  Pt will benefit from PT services in a SNF setting upon discharge to safely address deficits listed in patient problem list for decreased caregiver assistance and eventual return to PLOF.     Follow Up Recommendations  SNF;Supervision/Assistance - 24 hour     Equipment Recommendations  None recommended by PT    Recommendations for Other Services       Precautions / Restrictions Precautions Precautions: Fall Restrictions Weight Bearing Restrictions: No    Mobility  Bed Mobility Overal bed mobility: Needs Assistance Bed Mobility: Supine to Sit;Sit to Supine     Supine to sit: Min assist Sit to supine: Min assist   General bed mobility comments: Mod cuing and min A for BLE and trunk control    Transfers Overall transfer level: Needs  assistance Equipment used: 2 person hand held assist Transfers: Sit to/from Stand Sit to Stand: Mod assist         General transfer comment: Mod A to come to full upright standing position  Ambulation/Gait Ambulation/Gait assistance: Min assist Gait Distance (Feet): 30 Feet Assistive device: 2 person hand held assist Gait Pattern/deviations: Step-through pattern;Decreased step length - right;Decreased step length - left;Trunk flexed Gait velocity: decreased   General Gait Details: Min A for stability with pt ambulating with flexed trunk posture and mildly tremulous BLE's with knees mostly in slight flexion throughout   Stairs             Wheelchair Mobility    Modified Rankin (Stroke Patients Only)       Balance Overall balance assessment: Needs assistance Sitting-balance support: Feet supported;Bilateral upper extremity supported Sitting balance-Leahy Scale: Fair     Standing balance support: During functional activity;Bilateral upper extremity supported Standing balance-Leahy Scale: Poor Standing balance comment: Min A for stability during ambulation                            Cognition Arousal/Alertness: Awake/alert Behavior During Therapy: Impulsive Overall Cognitive Status: No family/caregiver present to determine baseline cognitive functioning                                        Exercises Total Joint Exercises Ankle Circles/Pumps: AROM;AAROM;Both;5 reps;10 reps Heel Slides: AROM;AAROM;Both;10 reps Hip ABduction/ADduction: AROM;AAROM;Both;10 reps Straight Leg Raises:  AROM;AAROM;Both;10 reps Other Exercises Other Exercises: Static sitting at EOB without UE support for imoproved core strength and activity tolerance Other Exercises: Sit to/from stand transfer training from multiple surfaces Other Exercises: rolling left/right with cues for sequencing    General Comments        Pertinent Vitals/Pain Pain Assessment:  No/denies pain    Home Living                      Prior Function            PT Goals (current goals can now be found in the care plan section) Progress towards PT goals: Progressing toward goals    Frequency    Min 2X/week      PT Plan Current plan remains appropriate    Co-evaluation              AM-PAC PT "6 Clicks" Mobility   Outcome Measure  Help needed turning from your back to your side while in a flat bed without using bedrails?: A Little Help needed moving from lying on your back to sitting on the side of a flat bed without using bedrails?: A Little Help needed moving to and from a bed to a chair (including a wheelchair)?: A Little Help needed standing up from a chair using your arms (e.g., wheelchair or bedside chair)?: A Little Help needed to walk in hospital room?: A Little Help needed climbing 3-5 steps with a railing? : A Lot 6 Click Score: 17    End of Session Equipment Utilized During Treatment: Gait belt Activity Tolerance: Patient tolerated treatment well Patient left: in bed;with call bell/phone within reach;with bed alarm set Nurse Communication: Mobility status PT Visit Diagnosis: Unsteadiness on feet (R26.81);History of falling (Z91.81);Difficulty in walking, not elsewhere classified (R26.2);Muscle weakness (generalized) (M62.81)     Time: 1601-0932 PT Time Calculation (min) (ACUTE ONLY): 23 min  Charges:  $Gait Training: 8-22 mins $Therapeutic Exercise: 8-22 mins                     D. Scott Jaeden [place] PT, DPT 02/12/21, 1:30 PM

## 2021-02-12 NOTE — Care Management Important Message (Signed)
Important Message  Patient Details  Name: MAICOL BOWLAND MRN: [id number] Date of Birth: 1932/08/05   Medicare Important Message Given:  Yes Reviewed with wife via phone.     Allayne Butcher, RN 02/12/2021, 12:46 PM

## 2021-02-12 NOTE — TOC Transition Note (Signed)
Transition of Care [facility]) - CM/SW Discharge Note   Patient Details  Name: Jerry Barrett MRN: [id number] Date of Birth: 12-17-1932  [facility] of [facility] CM/SW Contact:  Allayne Butcher, RN Phone Number: 02/12/2021, 11:21 AM   Clinical Narrative:    Patient is medically cleared for discharge to SNF today.  Patient's wife wanted [facility] in [place] and they can offer a bed.  Patient will go to room E4, bedside RN will call report to [phone].  RNCM will set up EMS transport.     Final next level of care: Skilled Nursing Facility Barriers to Discharge: Barriers Resolved   Patient Goals and CMS Choice Patient states their goals for this hospitalization and ongoing recovery are:: Wife would like for patient to go to [facility] CMS Medicare.gov Compare Post Acute Care list provided to:: Patient Represenative (must comment) Choice offered to / list presented to : Spouse  Discharge Placement              Patient chooses bed at: [facility] Patient to be transferred to facility by: [facility] Name of family member notified: Freddie Patient and family notified of of transfer: 02/12/21  Discharge Plan and Services   Discharge Planning Services: CM Consult Post Acute Care Choice: Skilled Nursing Facility          DME Arranged: N/A DME Agency: NA       HH Arranged: NA          Social Determinants of Health (SDOH) Interventions     Readmission Risk Interventions Readmission Risk Prevention Plan 11/30/2019  Transportation Screening Complete  PCP or Specialist Appt within 3-5 Days Complete  HRI or Home Care Consult Complete  Palliative Care Screening Complete  Medication Review (RN Care Manager) Complete  Some recent data might be hidden

## 2021-02-12 NOTE — Discharge Summary (Signed)
Physician Discharge Summary  AKSHATH MCCAREY QZE:[id number] DOB: [birth year] DOA: 02/07/2021  PCP: Rayetta Humphrey, MD  Admit date: 02/07/2021 Discharge date: 02/12/2021  Admitted From: Home Disposition:  Home  Discharge Condition:Stable CODE STATUS:DNR Diet recommendation: Heart Healthy    Brief/Interim Summary:  Patient is a 87 year old male with history of diabetes type 2, hypertension, hypothyroidism, history of CVA with residual left-sided weakness, hard of hearing, dementia, ambulatory dysfunction, CKD stage IIIb who presented here after he was found to be falling at home.  On presentation, CT head showed Small focal intraparenchymal hemorrhage in the right parietal lobe.  Neurosurgery was consulted with no plan for intervention.  Repeat CT head stable.PT/OT recommended SNF on DC  .  Medically stable for discharge  Following problems were addressed during his hospitalization:  Traumatic intracranial hemorrhage: Presented with fall. CT head showed Small focal intraparenchymal hemorrhage in the right parietal lobe.  Neurosurgery was consulted with no plan for intervention.  Repeat CT head stable   Hypertension: Monitor blood pressure.  On hydralazine.  Avoid hypertension   Diabetes type 2: Continue glipizide   Hypothyroidism: TSH of 5.  On Synthroid 75 mcg daily.  Repeat TSH in 6 weeks.   Vascular dementia/failure to thrive: Follows with outpatient palliative care.  Plan for discharge to skilled nursing facility with palliative  care follow-up versus hospice.  Patient has poor oral intake.  Consulted had nutrition services.   History of CKD stage IIIb: Currently kidney function at baseline   Debility/deconditioning/frequent fall: Living at home.  Now the plan is for placement in a skilled nursing facility.  TOC following.    Discharge Diagnoses:  Active Problems:   Intraparenchymal hemorrhage of brain (HCC)   Pressure injury of skin    Discharge Instructions  Discharge  Instructions     Diet - low sodium heart healthy   Complete by: As directed    Discharge instructions   Complete by: As directed    1)Please take prescribed medications as instructed 2)Follow up with palliative care as an outpatient.   Increase activity slowly   Complete by: As directed    No wound care   Complete by: As directed       Allergies as of 02/12/2021       Reactions   Cephalexin Swelling, Rash        Medication List     STOP taking these medications    aspirin 81 MG chewable tablet       TAKE these medications    atorvastatin 20 MG tablet Commonly known as: LIPITOR Take 20 mg by mouth at bedtime.   glipiZIDE 2.5 MG 24 hr tablet Commonly known as: GLUCOTROL XL Take 2.5 mg by mouth daily with breakfast.   hydrALAZINE 10 MG tablet Commonly known as: APRESOLINE Take 1 tablet (10 mg total) by mouth every 8 (eight) hours.   levothyroxine 75 MCG tablet Commonly known as: SYNTHROID Take 1 tablet (75 mcg total) by mouth daily at 6 (six) AM. Start taking on: February 13, 2021   lisinopril 2.5 MG tablet Commonly known as: ZESTRIL Take 2.5 mg by mouth daily.        Allergies  Allergen Reactions   Cephalexin Swelling and Rash    Consultations: Neurosurgery   Procedures/Studies: CT HEAD WO CONTRAST ( )  Result Date: 02/08/2021 CLINICAL DATA:  Altered mental status EXAM: CT HEAD WITHOUT CONTRAST TECHNIQUE: Contiguous axial images were obtained from the base of the skull through the vertex without intravenous contrast. COMPARISON:  02/08/2021 FINDINGS: Brain: Small area of cortical hemorrhage again seen in the right parietal lesion measuring up to 5 mm in largest diameter, unchanged since prior study. There is atrophy and chronic small vessel disease changes. No new hemorrhage or acute infarct. No hydrocephalus. Vascular: No hyperdense vessel or unexpected calcification. Skull: No acute calvarial abnormality. Sinuses/Orbits: Visualized paranasal  sinuses and mastoids clear. Orbital soft tissues unremarkable. Other: None IMPRESSION: Stable small cortical hemorrhage in the right parietal lobe. No new areas of hemorrhage. Atrophy, chronic small vessel disease. Electronically Signed   By: Charlett Nose M.D.   On: 02/08/2021 23:53   CT HEAD WO CONTRAST ( )  Result Date: 02/08/2021 CLINICAL DATA:  Intraparenchymal hemorrhage follow-up EXAM: CT HEAD WITHOUT CONTRAST TECHNIQUE: Contiguous axial images were obtained from the base of the skull through the vertex without intravenous contrast. COMPARISON:  02/07/2021 FINDINGS: Brain: Small focus of cortical hemorrhage in the anterior right parietal lobe is unchanged. There is generalized volume loss and findings of chronic small vessel ischemia. Vascular: No abnormal hyperdensity of the major intracranial arteries or dural venous sinuses. No intracranial atherosclerosis. Skull: The visualized skull base, calvarium and extracranial soft tissues are normal. Sinuses/Orbits: No fluid levels or advanced mucosal thickening of the visualized paranasal sinuses. No mastoid or middle ear effusion. The orbits are normal. IMPRESSION: Unchanged small focus of cortical hemorrhage in the anterior right parietal lobe. Electronically Signed   By: Deatra Robinson M.D.   On: 02/08/2021 00:38   CT HEAD WO CONTRAST ( )  Result Date: 02/07/2021 CLINICAL DATA:  Mental status change, unknown cause; Neck trauma (Age >= 65y) EXAM: CT HEAD WITHOUT CONTRAST CT CERVICAL SPINE WITHOUT CONTRAST TECHNIQUE: Multidetector CT imaging of the head and cervical spine was performed following the standard protocol without intravenous contrast. Multiplanar CT image reconstructions of the cervical spine were also generated. COMPARISON:  09/06/2020, 11/30/2019 FINDINGS: CT HEAD FINDINGS Brain: Small amount of hyperattenuating material within the peripheral aspect of the right parietal lobe (series 4, image 40), new from prior, suspicious for  intraparenchymal hemorrhage. No mass effect or midline shift. No additional sites of intracranial hemorrhage. No extra-axial collection. No hydrocephalus. Scattered low-density changes within the periventricular and subcortical white matter compatible with chronic microvascular ischemic change. Mild diffuse cerebral volume loss. Vascular: Atherosclerotic calcifications involving the large vessels of the skull base. No unexpected hyperdense vessel. Skull: Normal. Negative for fracture or focal lesion. Sinuses/Orbits: Unchanged right frontal osteoma. Partial posterior left ethmoid air cell opacification. Otherwise clear. Other: Negative for scalp hematoma. CT CERVICAL SPINE FINDINGS Alignment: Facet joints are aligned without dislocation or traumatic listhesis. Dens and lateral masses are aligned. Unchanged facet mediated grade 1 anterolisthesis of C6 on C7. Skull base and vertebrae: No acute fracture. No primary bone lesion or focal pathologic process. Soft tissues and spinal canal: No prevertebral fluid or swelling. No visible canal hematoma. Disc levels: Advanced facet-predominant multilevel cervical spondylosis, not appreciably progressed from the prior CT. Upper chest: Included lung apices are clear. Other: Bilateral carotid atherosclerosis. IMPRESSION: 1. Small focal intraparenchymal hemorrhage in the right parietal lobe. 2. No acute fracture or traumatic listhesis of the cervical spine. 3. Advanced multilevel cervical spondylosis. These results were called by telephone at the time of interpretation on 02/07/2021 at 6:08 pm to provider [facility] , who verbally acknowledged these results. Electronically Signed   By: Duanne Guess D.O.   On: 02/07/2021 18:08   CT Cervical Spine Wo Contrast  Result Date: 02/07/2021 CLINICAL DATA:  Mental status change, unknown  cause; Neck trauma (Age >= 65y) EXAM: CT HEAD WITHOUT CONTRAST CT CERVICAL SPINE WITHOUT CONTRAST TECHNIQUE: Multidetector CT imaging of the head and  cervical spine was performed following the standard protocol without intravenous contrast. Multiplanar CT image reconstructions of the cervical spine were also generated. COMPARISON:  09/06/2020, 11/30/2019 FINDINGS: CT HEAD FINDINGS Brain: Small amount of hyperattenuating material within the peripheral aspect of the right parietal lobe (series 4, image 40), new from prior, suspicious for intraparenchymal hemorrhage. No mass effect or midline shift. No additional sites of intracranial hemorrhage. No extra-axial collection. No hydrocephalus. Scattered low-density changes within the periventricular and subcortical white matter compatible with chronic microvascular ischemic change. Mild diffuse cerebral volume loss. Vascular: Atherosclerotic calcifications involving the large vessels of the skull base. No unexpected hyperdense vessel. Skull: Normal. Negative for fracture or focal lesion. Sinuses/Orbits: Unchanged right frontal osteoma. Partial posterior left ethmoid air cell opacification. Otherwise clear. Other: Negative for scalp hematoma. CT CERVICAL SPINE FINDINGS Alignment: Facet joints are aligned without dislocation or traumatic listhesis. Dens and lateral masses are aligned. Unchanged facet mediated grade 1 anterolisthesis of C6 on C7. Skull base and vertebrae: No acute fracture. No primary bone lesion or focal pathologic process. Soft tissues and spinal canal: No prevertebral fluid or swelling. No visible canal hematoma. Disc levels: Advanced facet-predominant multilevel cervical spondylosis, not appreciably progressed from the prior CT. Upper chest: Included lung apices are clear. Other: Bilateral carotid atherosclerosis. IMPRESSION: 1. Small focal intraparenchymal hemorrhage in the right parietal lobe. 2. No acute fracture or traumatic listhesis of the cervical spine. 3. Advanced multilevel cervical spondylosis. These results were called by telephone at the time of interpretation on 02/07/2021 at 6:08 pm to  provider [facility] , who verbally acknowledged these results. Electronically Signed   By: Duanne Guess D.O.   On: 02/07/2021 18:08   DG Chest Portable 1 View  Result Date: 02/07/2021 CLINICAL DATA:  Status post fall. EXAM: PORTABLE CHEST 1 VIEW COMPARISON:  November 27, 2019 FINDINGS: Low lung volumes are seen. Mild, chronic appearing increased lung markings are noted. There is no evidence of acute infiltrate, pleural effusion or pneumothorax. The heart size and mediastinal contours are within normal limits. Marked severity calcification of the aortic arch is noted. Degenerative changes seen throughout the thoracic spine. IMPRESSION: No active cardiopulmonary disease. Electronically Signed   By: Aram Candela M.D.   On: 02/07/2021 17:15      Subjective: Patient seen and examined the bedside this morning.  Hemodynamically stable.  Denies any complaints today.  Alert and awake but not oriented.  Comfortable.I called discussed with wife on phone about discharge planning  Discharge Exam: Vitals:   02/12/21 0535 02/12/21 0756  BP: 130/62 138/88  Pulse: 88 89  Resp: 16 15  Temp: (!) 97.4 F (36.3 C) 98.6 F (37 C)  SpO2: 98% 91%   Vitals:   02/12/21 0040 02/12/21 0534 02/12/21 0535 02/12/21 0756  BP: 124/67 130/62 130/62 138/88  Pulse: 84  88 89  Resp: Temp: 98.2 F (36.8 C)  (!) 97.4 F (36.3 C) 98.6 F (37 C)  TempSrc:      SpO2: 96%  98% 91%  Weight:      Height:        General: Pt is alert, awake, not in acute distress Cardiovascular: RRR, S1/S2 +, no rubs, no gallops Respiratory: CTA bilaterally, no wheezing, no rhonchi Abdominal: Soft, NT, ND, bowel sounds + Extremities: no edema, no cyanosis  The results of significant diagnostics from this hospitalization (including imaging, microbiology, ancillary and laboratory) are listed below for reference.     Microbiology: Recent Results (from the past 240 hour(s))  Resp Panel by RT-PCR (Flu A&B, Covid)  Nasopharyngeal Swab     Status: None   Collection Time: 02/07/21  4:55 PM   Specimen: Nasopharyngeal Swab; Nasopharyngeal(NP) swabs in vial transport medium  Result Value Ref Range Status   SARS Coronavirus 2 by RT PCR NEGATIVE NEGATIVE Final    Comment: (NOTE) SARS-CoV-2 target nucleic acids are NOT DETECTED.  The SARS-CoV-2 RNA is generally detectable in upper respiratory specimens during the acute phase of infection. The lowest concentration of SARS-CoV-2 viral copies this assay can detect is 138 copies/mL. A negative result does not preclude SARS-Cov-2 infection and should not be used as the sole basis for treatment or other patient management decisions. A negative result may occur with  improper specimen collection/handling, submission of specimen other than nasopharyngeal swab, presence of viral mutation(s) within the areas targeted by this assay, and inadequate number of viral copies(<138 copies/mL). A negative result must be combined with clinical observations, patient history, and epidemiological information. The expected result is Negative.  Fact Sheet for Patients:  BloggerCourse.comhttps://www.fda.gov/media/152166/download  Fact Sheet for Healthcare Providers:  SeriousBroker.ithttps://www.fda.gov/media/152162/download  This test is no t yet approved or cleared by the [place] FDA and  has been authorized for detection and/or diagnosis of SARS-CoV-2 by FDA under an Emergency Use Authorization (EUA). This EUA will remain  in effect (meaning this test can be used) for the duration of the COVID-19 declaration under Section 564(b)(1) of the Act, 21 U.S.C.section 360bbb-3(b)(1), unless the authorization is terminated  or revoked sooner.       Influenza A by PCR NEGATIVE NEGATIVE Final   Influenza B by PCR NEGATIVE NEGATIVE Final    Comment: (NOTE) The Xpert Xpress SARS-CoV-2/FLU/RSV plus assay is intended as an aid in the diagnosis of influenza from Nasopharyngeal swab specimens and should not be  used as a sole basis for treatment. Nasal washings and aspirates are unacceptable for Xpert Xpress SARS-CoV-2/FLU/RSV testing.  Fact Sheet for Patients: BloggerCourse.comhttps://www.fda.gov/media/152166/download  Fact Sheet for Healthcare Providers: SeriousBroker.ithttps://www.fda.gov/media/152162/download  This test is not yet approved or cleared by the [place] FDA and has been authorized for detection and/or diagnosis of SARS-CoV-2 by FDA under an Emergency Use Authorization (EUA). This EUA will remain in effect (meaning this test can be used) for the duration of the COVID-19 declaration under Section 564(b)(1) of the Act, 21 U.S.C. section 360bbb-3(b)(1), unless the authorization is terminated or revoked.  Performed at [facility], [address] [place], [place] [id number]   Resp Panel by RT-PCR (Flu A&B, Covid) Nasopharyngeal Swab     Status: None   Collection Time: 02/11/21 12:57 PM   Specimen: Nasopharyngeal Swab; Nasopharyngeal(NP) swabs in vial transport medium  Result Value Ref Range Status   SARS Coronavirus 2 by RT PCR NEGATIVE NEGATIVE Final    Comment: (NOTE) SARS-CoV-2 target nucleic acids are NOT DETECTED.  The SARS-CoV-2 RNA is generally detectable in upper respiratory specimens during the acute phase of infection. The lowest concentration of SARS-CoV-2 viral copies this assay can detect is 138 copies/mL. A negative result does not preclude SARS-Cov-2 infection and should not be used as the sole basis for treatment or other patient management decisions. A negative result may occur with  improper specimen collection/handling, submission of specimen other than nasopharyngeal swab, presence of viral mutation(s) within the areas targeted  by this assay, and inadequate number of viral copies(<138 copies/mL). A negative result must be combined with clinical observations, patient history, and epidemiological information. The expected result is Negative.  Fact Sheet for Patients:   BloggerCourse.com  Fact Sheet for Healthcare Providers:  SeriousBroker.it  This test is no t yet approved or cleared by the [place] FDA and  has been authorized for detection and/or diagnosis of SARS-CoV-2 by FDA under an Emergency Use Authorization (EUA). This EUA will remain  in effect (meaning this test can be used) for the duration of the COVID-19 declaration under Section 564(b)(1) of the Act, 21 U.S.C.section 360bbb-3(b)(1), unless the authorization is terminated  or revoked sooner.       Influenza A by PCR NEGATIVE NEGATIVE Final   Influenza B by PCR NEGATIVE NEGATIVE Final    Comment: (NOTE) The Xpert Xpress SARS-CoV-2/FLU/RSV plus assay is intended as an aid in the diagnosis of influenza from Nasopharyngeal swab specimens and should not be used as a sole basis for treatment. Nasal washings and aspirates are unacceptable for Xpert Xpress SARS-CoV-2/FLU/RSV testing.  Fact Sheet for Patients: BloggerCourse.com  Fact Sheet for Healthcare Providers: SeriousBroker.it  This test is not yet approved or cleared by the [place] FDA and has been authorized for detection and/or diagnosis of SARS-CoV-2 by FDA under an Emergency Use Authorization (EUA). This EUA will remain in effect (meaning this test can be used) for the duration of the COVID-19 declaration under Section 564(b)(1) of the Act, 21 U.S.C. section 360bbb-3(b)(1), unless the authorization is terminated or revoked.  Performed at [facility], [address] [place], [place] [address]      Labs: BNP (last 3 results) No results for input(s): BNP in the last 8760 hours. Basic Metabolic Panel: Recent Labs  Lab 02/07/21 1358  NA 136  K 3.9  CL 104  CO2 25  GLUCOSE 139*  BUN 19  CREATININE 1.38*  CALCIUM 9.0  MG 2.1   Liver Function Tests: Recent Labs  Lab 02/07/21 1358  AST 20   ALT 17  ALKPHOS 80  BILITOT 1.3*  PROT 6.8  ALBUMIN 3.6   No results for input(s): LIPASE, AMYLASE in the last 168 hours. No results for input(s): AMMONIA in the last 168 hours. CBC: Recent Labs  Lab 02/07/21 1358  WBC 9.8  HGB 14.4  HCT 39.5  MCV 94.7  PLT 178   Cardiac Enzymes: No results for input(s): CKTOTAL, CKMB, CKMBINDEX, TROPONINI in the last 168 hours. BNP: Invalid input(s): POCBNP CBG: Recent Labs  Lab 02/10/21 0835 02/10/21 1147 02/11/21 0829 02/11/21 1242 02/12/21 0757  GLUCAP 133* 147* 127* 124* 131*   D-Dimer No results for input(s): DDIMER in the last 72 hours. Hgb A1c No results for input(s): HGBA1C in the last 72 hours. Lipid Profile No results for input(s): CHOL, HDL, LDLCALC, TRIG, CHOLHDL, LDLDIRECT in the last 72 hours. Thyroid function studies No results for input(s): TSH, T4TOTAL, T3FREE, THYROIDAB in the last 72 hours.  Invalid input(s): FREET3 Anemia work up No results for input(s): VITAMINB12, FOLATE, FERRITIN, TIBC, IRON, RETICCTPCT in the last 72 hours. Urinalysis    Component Value Date/Time   COLORURINE YELLOW (A) 02/07/2021 1701   APPEARANCEUR CLEAR (A) 02/07/2021 1701   LABSPEC 1.021 02/07/2021 1701   PHURINE 5.0 02/07/2021 1701   GLUCOSEU NEGATIVE 02/07/2021 1701   HGBUR SMALL (A) 02/07/2021 1701   BILIRUBINUR NEGATIVE 02/07/2021 1701   KETONESUR NEGATIVE 02/07/2021 1701   PROTEINUR NEGATIVE 02/07/2021 1701  NITRITE NEGATIVE 02/07/2021 1701   LEUKOCYTESUR NEGATIVE 02/07/2021 1701   Sepsis Labs Invalid input(s): PROCALCITONIN,  WBC,  LACTICIDVEN Microbiology Recent Results (from the past 240 hour(s))  Resp Panel by RT-PCR (Flu A&B, Covid) Nasopharyngeal Swab     Status: None   Collection Time: 02/07/21  4:55 PM   Specimen: Nasopharyngeal Swab; Nasopharyngeal(NP) swabs in vial transport medium  Result Value Ref Range Status   SARS Coronavirus 2 by RT PCR NEGATIVE NEGATIVE Final    Comment: (NOTE) SARS-CoV-2 target  nucleic acids are NOT DETECTED.  The SARS-CoV-2 RNA is generally detectable in upper respiratory specimens during the acute phase of infection. The lowest concentration of SARS-CoV-2 viral copies this assay can detect is 138 copies/mL. A negative result does not preclude SARS-Cov-2 infection and should not be used as the sole basis for treatment or other patient management decisions. A negative result may occur with  improper specimen collection/handling, submission of specimen other than nasopharyngeal swab, presence of viral mutation(s) within the areas targeted by this assay, and inadequate number of viral copies(<138 copies/mL). A negative result must be combined with clinical observations, patient history, and epidemiological information. The expected result is Negative.  Fact Sheet for Patients:  BloggerCourse.com  Fact Sheet for Healthcare Providers:  SeriousBroker.it  This test is no t yet approved or cleared by the [place] FDA and  has been authorized for detection and/or diagnosis of SARS-CoV-2 by FDA under an Emergency Use Authorization (EUA). This EUA will remain  in effect (meaning this test can be used) for the duration of the COVID-19 declaration under Section 564(b)(1) of the Act, 21 U.S.C.section 360bbb-3(b)(1), unless the authorization is terminated  or revoked sooner.       Influenza A by PCR NEGATIVE NEGATIVE Final   Influenza B by PCR NEGATIVE NEGATIVE Final    Comment: (NOTE) The Xpert Xpress SARS-CoV-2/FLU/RSV plus assay is intended as an aid in the diagnosis of influenza from Nasopharyngeal swab specimens and should not be used as a sole basis for treatment. Nasal washings and aspirates are unacceptable for Xpert Xpress SARS-CoV-2/FLU/RSV testing.  Fact Sheet for Patients: BloggerCourse.com  Fact Sheet for Healthcare  Providers: SeriousBroker.it  This test is not yet approved or cleared by the [place] FDA and has been authorized for detection and/or diagnosis of SARS-CoV-2 by FDA under an Emergency Use Authorization (EUA). This EUA will remain in effect (meaning this test can be used) for the duration of the COVID-19 declaration under Section 564(b)(1) of the Act, 21 U.S.C. section 360bbb-3(b)(1), unless the authorization is terminated or revoked.  Performed at [facility], [address] [place], [place] [address]   Resp Panel by RT-PCR (Flu A&B, Covid) Nasopharyngeal Swab     Status: None   Collection Time: 02/11/21 12:57 PM   Specimen: Nasopharyngeal Swab; Nasopharyngeal(NP) swabs in vial transport medium  Result Value Ref Range Status   SARS Coronavirus 2 by RT PCR NEGATIVE NEGATIVE Final    Comment: (NOTE) SARS-CoV-2 target nucleic acids are NOT DETECTED.  The SARS-CoV-2 RNA is generally detectable in upper respiratory specimens during the acute phase of infection. The lowest concentration of SARS-CoV-2 viral copies this assay can detect is 138 copies/mL. A negative result does not preclude SARS-Cov-2 infection and should not be used as the sole basis for treatment or other patient management decisions. A negative result may occur with  improper specimen collection/handling, submission of specimen other than nasopharyngeal swab, presence of viral mutation(s) within the areas targeted by this  assay, and inadequate number of viral copies(<138 copies/mL). A negative result must be combined with clinical observations, patient history, and epidemiological information. The expected result is Negative.  Fact Sheet for Patients:  BloggerCourse.com  Fact Sheet for Healthcare Providers:  SeriousBroker.it  This test is no t yet approved or cleared by the [place] FDA and  has been authorized for  detection and/or diagnosis of SARS-CoV-2 by FDA under an Emergency Use Authorization (EUA). This EUA will remain  in effect (meaning this test can be used) for the duration of the COVID-19 declaration under Section 564(b)(1) of the Act, 21 U.S.C.section 360bbb-3(b)(1), unless the authorization is terminated  or revoked sooner.       Influenza A by PCR NEGATIVE NEGATIVE Final   Influenza B by PCR NEGATIVE NEGATIVE Final    Comment: (NOTE) The Xpert Xpress SARS-CoV-2/FLU/RSV plus assay is intended as an aid in the diagnosis of influenza from Nasopharyngeal swab specimens and should not be used as a sole basis for treatment. Nasal washings and aspirates are unacceptable for Xpert Xpress SARS-CoV-2/FLU/RSV testing.  Fact Sheet for Patients: BloggerCourse.com  Fact Sheet for Healthcare Providers: SeriousBroker.it  This test is not yet approved or cleared by the [place] FDA and has been authorized for detection and/or diagnosis of SARS-CoV-2 by FDA under an Emergency Use Authorization (EUA). This EUA will remain in effect (meaning this test can be used) for the duration of the COVID-19 declaration under Section 564(b)(1) of the Act, 21 U.S.C. section 360bbb-3(b)(1), unless the authorization is terminated or revoked.  Performed at [facility], [address], [place], [place] [address]     Please note: You were cared for by a hospitalist during your hospital stay. Once you are discharged, your primary care physician will handle any further medical issues. Please note that NO REFILLS for any discharge medications will be authorized once you are discharged, as it is imperative that you return to your primary care physician (or establish a relationship with a primary care physician if you do not have one) for your post hospital discharge needs so that they can reassess your need for medications and monitor your lab  values.    Time coordinating discharge: 40 minutes  SIGNED:   Burnadette Pop, MD  [facility] 02/12/2021, 10:17 AM Pager [phone]  If 7PM-7AM, please contact night-coverage [email or web address] Password [email or web address]

## 2021-02-27 ENCOUNTER — Telehealth: Payer: Self-pay

## 2021-02-27 NOTE — Telephone Encounter (Signed)
Phone call placed to patient/wife to check in after hospitalization. No answer, no VM.

## 2021-03-06 ENCOUNTER — Other Ambulatory Visit: Payer: Self-pay

## 2021-03-06 ENCOUNTER — Telehealth: Payer: Self-pay | Admitting: Primary Care

## 2021-03-06 ENCOUNTER — Non-Acute Institutional Stay: Payer: Medicare Other | Admitting: Primary Care

## 2021-03-06 NOTE — Telephone Encounter (Signed)
Patient expired 2021-03-08 at SNF.

## 2021-03-09 DEATH — deceased
